# Patient Record
Sex: Female | Born: 1970 | Race: Black or African American | Hispanic: No | Marital: Single | Smoking: Never smoker
Health system: Southern US, Community
[De-identification: ages and names within clinical notes are randomized; demographics above are authoritative.]

## PROBLEM LIST (undated history)

## (undated) DIAGNOSIS — D649 Anemia, unspecified: Secondary | ICD-10-CM

## (undated) DIAGNOSIS — K589 Irritable bowel syndrome without diarrhea: Secondary | ICD-10-CM

## (undated) DIAGNOSIS — M1990 Unspecified osteoarthritis, unspecified site: Secondary | ICD-10-CM

## (undated) DIAGNOSIS — R7303 Prediabetes: Secondary | ICD-10-CM

## (undated) DIAGNOSIS — T8859XA Other complications of anesthesia, initial encounter: Secondary | ICD-10-CM

## (undated) DIAGNOSIS — K219 Gastro-esophageal reflux disease without esophagitis: Secondary | ICD-10-CM

## (undated) DIAGNOSIS — T7840XA Allergy, unspecified, initial encounter: Secondary | ICD-10-CM

## (undated) DIAGNOSIS — J189 Pneumonia, unspecified organism: Secondary | ICD-10-CM

## (undated) DIAGNOSIS — E739 Lactose intolerance, unspecified: Secondary | ICD-10-CM

## (undated) DIAGNOSIS — Z9889 Other specified postprocedural states: Secondary | ICD-10-CM

## (undated) DIAGNOSIS — I2699 Other pulmonary embolism without acute cor pulmonale: Secondary | ICD-10-CM

## (undated) DIAGNOSIS — R112 Nausea with vomiting, unspecified: Secondary | ICD-10-CM

## (undated) DIAGNOSIS — G4730 Sleep apnea, unspecified: Secondary | ICD-10-CM

## (undated) DIAGNOSIS — T4145XA Adverse effect of unspecified anesthetic, initial encounter: Secondary | ICD-10-CM

## (undated) DIAGNOSIS — G2581 Restless legs syndrome: Secondary | ICD-10-CM

## (undated) HISTORY — DX: Unspecified osteoarthritis, unspecified site: M19.90

## (undated) HISTORY — DX: Sleep apnea, unspecified: G47.30

## (undated) HISTORY — PX: KNEE SURGERY: SHX244

## (undated) HISTORY — DX: Gastro-esophageal reflux disease without esophagitis: K21.9

## (undated) HISTORY — DX: Lactose intolerance, unspecified: E73.9

## (undated) HISTORY — DX: Allergy, unspecified, initial encounter: T78.40XA

---

## 1998-08-23 ENCOUNTER — Ambulatory Visit (HOSPITAL_COMMUNITY): Admission: RE | Admit: 1998-08-23 | Discharge: 1998-08-23 | Payer: Self-pay | Admitting: Family Medicine

## 1999-04-13 ENCOUNTER — Encounter: Payer: Self-pay | Admitting: Family Medicine

## 1999-04-13 ENCOUNTER — Ambulatory Visit (HOSPITAL_COMMUNITY): Admission: RE | Admit: 1999-04-13 | Discharge: 1999-04-13 | Payer: Self-pay | Admitting: Family Medicine

## 2000-01-16 ENCOUNTER — Ambulatory Visit (HOSPITAL_BASED_OUTPATIENT_CLINIC_OR_DEPARTMENT_OTHER): Admission: RE | Admit: 2000-01-16 | Discharge: 2000-01-16 | Payer: Self-pay | Admitting: Orthopaedic Surgery

## 2002-09-10 ENCOUNTER — Encounter: Admission: RE | Admit: 2002-09-10 | Discharge: 2002-10-10 | Payer: Self-pay

## 2002-10-11 ENCOUNTER — Encounter: Admission: RE | Admit: 2002-10-11 | Discharge: 2002-11-10 | Payer: Self-pay | Admitting: *Deleted

## 2002-12-10 ENCOUNTER — Encounter: Admission: RE | Admit: 2002-12-10 | Discharge: 2003-01-09 | Payer: Self-pay | Admitting: *Deleted

## 2003-03-02 ENCOUNTER — Ambulatory Visit (HOSPITAL_BASED_OUTPATIENT_CLINIC_OR_DEPARTMENT_OTHER): Admission: RE | Admit: 2003-03-02 | Discharge: 2003-03-02 | Payer: Self-pay | Admitting: Orthopaedic Surgery

## 2003-04-07 ENCOUNTER — Emergency Department (HOSPITAL_COMMUNITY): Admission: EM | Admit: 2003-04-07 | Discharge: 2003-04-08 | Payer: Self-pay | Admitting: Emergency Medicine

## 2003-04-08 ENCOUNTER — Encounter: Payer: Self-pay | Admitting: Emergency Medicine

## 2004-08-01 ENCOUNTER — Encounter: Admission: RE | Admit: 2004-08-01 | Discharge: 2004-08-01 | Payer: Self-pay | Admitting: *Deleted

## 2004-08-03 ENCOUNTER — Encounter: Admission: RE | Admit: 2004-08-03 | Discharge: 2004-08-03 | Payer: Self-pay | Admitting: Interventional Radiology

## 2004-08-30 ENCOUNTER — Observation Stay (HOSPITAL_COMMUNITY): Admission: RE | Admit: 2004-08-30 | Discharge: 2004-08-31 | Payer: Self-pay | Admitting: Interventional Radiology

## 2005-06-27 ENCOUNTER — Encounter: Admission: RE | Admit: 2005-06-27 | Discharge: 2005-06-27 | Payer: Self-pay | Admitting: *Deleted

## 2006-01-01 HISTORY — PX: ABDOMINAL HYSTERECTOMY: SHX81

## 2006-01-01 HISTORY — PX: TOTAL ABDOMINAL HYSTERECTOMY: SHX209

## 2006-01-02 ENCOUNTER — Encounter (INDEPENDENT_AMBULATORY_CARE_PROVIDER_SITE_OTHER): Payer: Self-pay | Admitting: Specialist

## 2006-01-02 ENCOUNTER — Ambulatory Visit (HOSPITAL_COMMUNITY): Admission: RE | Admit: 2006-01-02 | Discharge: 2006-01-03 | Payer: Self-pay | Admitting: *Deleted

## 2006-05-20 ENCOUNTER — Ambulatory Visit (HOSPITAL_BASED_OUTPATIENT_CLINIC_OR_DEPARTMENT_OTHER): Admission: RE | Admit: 2006-05-20 | Discharge: 2006-05-20 | Payer: Self-pay | Admitting: Otolaryngology

## 2006-05-26 ENCOUNTER — Ambulatory Visit: Payer: Self-pay | Admitting: Internal Medicine

## 2006-10-04 ENCOUNTER — Ambulatory Visit: Payer: Self-pay | Admitting: Family Medicine

## 2006-11-07 ENCOUNTER — Encounter: Admission: RE | Admit: 2006-11-07 | Discharge: 2006-11-07 | Payer: Self-pay | Admitting: Interventional Radiology

## 2006-11-07 ENCOUNTER — Ambulatory Visit: Payer: Self-pay | Admitting: Family Medicine

## 2006-11-24 ENCOUNTER — Emergency Department (HOSPITAL_COMMUNITY): Admission: EM | Admit: 2006-11-24 | Discharge: 2006-11-25 | Payer: Self-pay | Admitting: Emergency Medicine

## 2006-11-25 ENCOUNTER — Ambulatory Visit: Payer: Self-pay | Admitting: Family Medicine

## 2006-12-24 ENCOUNTER — Ambulatory Visit (HOSPITAL_COMMUNITY): Admission: RE | Admit: 2006-12-24 | Discharge: 2006-12-24 | Payer: Self-pay | Admitting: Gastroenterology

## 2007-03-24 ENCOUNTER — Encounter: Admission: RE | Admit: 2007-03-24 | Discharge: 2007-03-24 | Payer: Self-pay | Admitting: Orthopaedic Surgery

## 2007-05-15 ENCOUNTER — Ambulatory Visit: Payer: Self-pay | Admitting: Family Medicine

## 2007-09-04 HISTORY — PX: OTHER SURGICAL HISTORY: SHX169

## 2007-10-28 ENCOUNTER — Ambulatory Visit (HOSPITAL_BASED_OUTPATIENT_CLINIC_OR_DEPARTMENT_OTHER): Admission: RE | Admit: 2007-10-28 | Discharge: 2007-10-28 | Payer: Self-pay | Admitting: Orthopaedic Surgery

## 2007-11-27 ENCOUNTER — Encounter: Admission: RE | Admit: 2007-11-27 | Discharge: 2007-11-27 | Payer: Self-pay | Admitting: Family Medicine

## 2007-11-27 LAB — HM MAMMOGRAPHY: HM Mammogram: NEGATIVE

## 2007-12-01 ENCOUNTER — Ambulatory Visit: Payer: Self-pay | Admitting: Family Medicine

## 2007-12-02 ENCOUNTER — Encounter: Admission: RE | Admit: 2007-12-02 | Discharge: 2007-12-02 | Payer: Self-pay | Admitting: Cardiology

## 2007-12-02 ENCOUNTER — Encounter: Admission: RE | Admit: 2007-12-02 | Discharge: 2007-12-02 | Payer: Self-pay | Admitting: Family Medicine

## 2008-02-19 ENCOUNTER — Ambulatory Visit: Payer: Self-pay | Admitting: Family Medicine

## 2008-03-02 ENCOUNTER — Encounter: Payer: Self-pay | Admitting: Family Medicine

## 2008-03-02 ENCOUNTER — Ambulatory Visit: Payer: Self-pay | Admitting: Family Medicine

## 2008-03-02 ENCOUNTER — Other Ambulatory Visit: Admission: RE | Admit: 2008-03-02 | Discharge: 2008-03-02 | Payer: Self-pay | Admitting: Family Medicine

## 2008-03-02 LAB — HM PAP SMEAR: HM Pap smear: NEGATIVE

## 2008-06-30 ENCOUNTER — Ambulatory Visit: Payer: Self-pay | Admitting: Family Medicine

## 2008-10-15 ENCOUNTER — Ambulatory Visit: Payer: Self-pay | Admitting: Family Medicine

## 2008-12-28 ENCOUNTER — Ambulatory Visit: Payer: Self-pay | Admitting: Family Medicine

## 2009-06-27 ENCOUNTER — Ambulatory Visit: Payer: Self-pay | Admitting: Family Medicine

## 2009-06-30 ENCOUNTER — Encounter: Admission: RE | Admit: 2009-06-30 | Discharge: 2009-06-30 | Payer: Self-pay | Admitting: Family Medicine

## 2010-09-24 ENCOUNTER — Encounter: Payer: Self-pay | Admitting: *Deleted

## 2010-10-24 ENCOUNTER — Ambulatory Visit (HOSPITAL_BASED_OUTPATIENT_CLINIC_OR_DEPARTMENT_OTHER)
Admission: RE | Admit: 2010-10-24 | Discharge: 2010-10-24 | Disposition: A | Discharge status: Home / Self Care | Payer: BC Managed Care – PPO | Source: Ambulatory Visit | Attending: Orthopaedic Surgery | Admitting: Orthopaedic Surgery

## 2010-10-24 DIAGNOSIS — Z86718 Personal history of other venous thrombosis and embolism: Secondary | ICD-10-CM | POA: Insufficient documentation

## 2010-10-24 DIAGNOSIS — S83259A Bucket-handle tear of lateral meniscus, current injury, unspecified knee, initial encounter: Secondary | ICD-10-CM | POA: Insufficient documentation

## 2010-10-24 DIAGNOSIS — K219 Gastro-esophageal reflux disease without esophagitis: Secondary | ICD-10-CM | POA: Insufficient documentation

## 2010-10-24 DIAGNOSIS — E669 Obesity, unspecified: Secondary | ICD-10-CM | POA: Insufficient documentation

## 2010-10-24 LAB — POCT HEMOGLOBIN-HEMACUE: Hemoglobin: 13.5 g/dL (ref 12.0–15.0)

## 2010-11-08 NOTE — Op Note (Addendum)
NAMEMarquis Hicks                ACCOUNT NO.:  [id number]  MEDICAL RECORD NO.:  [id number]            PATIENT TYPE:  LOCATION:                                 FACILITY:  PHYSICIAN:  Lubertha Basque. Jerl Santos, M.D.     DATE OF BIRTH:  DATE OF PROCEDURE:  10/24/2010 DATE OF DISCHARGE:                              OPERATIVE REPORT   PREOPERATIVE DIAGNOSES: 1. Left knee torn medial meniscus. 2. Left knee degenerative joint disease.  POSTOPERATIVE DIAGNOSES: 1. Left knee torn lateral meniscus. 2. Left knee degenerative joint disease.  PROCEDURES: 1. Left knee partial lateral meniscectomy. 2. Left knee abrasion chondroplasty patellofemoral.  ANESTHESIA:  General and block.  ATTENDING SURGEON:  Lubertha Basque. Jerl Santos, MD  ASSISTANT:  Lindwood Qua, PA   INDICATIONS FOR PROCEDURE:  The patient is a 40 year old woman with 20 years of bilateral knee troubles.  She has had multiple procedures on this left knee most recently an arthroscopy perhaps 5-6 years back. Some significant degenerative changes were noted in the patellofemoral at that point.  At this time, she has several months of pain refractory to injection, anti-inflammatories, and bracing.  She persists with an effusion and some loss of extension.  She is offered an arthroscopy. Informed operative consent was obtained after discussion of possible complications including reaction to anesthesia and infection.  SUMMARY, FINDINGS, AND PROCEDURE:  Under general anesthesia and knee block, a left knee arthroscopy was performed.  The patellofemoral portion of the knee exhibited grade 4 change which was basically unchanged from her arthroscopy many years back.  A thorough chondroplasty was done along with abrasion to bleeding bone in some small areas through the intertrochlear groove.  The medial compartment was missing most of the medial meniscus and there were some grade II and III changes here.  The ACL reconstruction appeared to be  intact.  In the lateral compartment, she had displaced bucket-handle tear stuck in an anterior position.  This was removed taking about 25% of lateral meniscus in the process.  She had minimal degenerative change in the lateral compartment.  DESCRIPTION OF PROCEDURE:  The patient was to the operating suite where general anesthetic was applied without difficulty.  She was also given a block in the preanesthesia area.  She was positioned supine and prepped and draped in a normal sterile fashion.  After administration of IV vancomycin, an arthroscopy of the left knee was performed through a total of 2 portals.  Findings were as noted above and procedure consisted predominantly of the partial lateral meniscectomy done with basket and shaver back to stable tissues.  We also performed chondroplasty as indicated basically through the entire knee with abrasion to bleeding bone patellofemoral and in the intertrochlear groove.  The knee was thoroughly irrigated followed by placement of Adaptic over the portals, dry gauze, and loose Ace wrap.  Estimated blood loss and intraoperative fluids can be obtained from anesthesia records.  DISPOSITION:  The patient was extubated in the operating room and taken to the recovery room in stable addition.  She was to go home same-day and follow up in the office closely.  I will contact her by phone tonight.     Lubertha Basque Jerl Santos, M.D.     PGD/MEDQ  D:  10/24/2010  T:  10/25/2010  Job:  [id number]  Electronically Signed by Marcene Corning M.D. on 11/07/2010 07:32:31 PM

## 2011-01-16 NOTE — Op Note (Signed)
NAMEHAIZLEE, HENTON      ACCOUNT NO.:  [id number]   MEDICAL RECORD NO.:  [id number]          PATIENT TYPE:  AMB   LOCATION:  DSC                          FACILITY:  [facility]   PHYSICIAN:  Lubertha Basque. Dalldorf, M.D.DATE OF BIRTH:  [birth year]   DATE OF PROCEDURE:  10/28/2007  DATE OF DISCHARGE:                               OPERATIVE REPORT   PREOPERATIVE DIAGNOSES:  1. Right wrist partial scapholunate tear.  2. Right knee chondromalacia.   POSTOPERATIVE DIAGNOSES:  1. Right wrist partial scapholunate tear.  2. Right knee chondromalacia of the patella.  3. Right knee loose body.   PROCEDURES:  1. Right wrist arthroscopic debridement.  2. Right knee arthroscopic abrasion chondroplasty, patellofemoral.  3. Right knee arthroscopic removal, large loose body.   ANESTHESIA:  General.   ATTENDING SURGEON:  Lubertha Basque. Jerl Santos, M.D.   INDICATIONS FOR PROCEDURE:  The patient is a 40 year old woman with  about a year of right wrist pain.  She underwent an MRI scan, which did  show a partial scapholunate tear.  We treated her with immobilization  and, I believe, an injection, and she has been left with occasional  sharp pains in the wrist.  This has become disabling to her and she is  offered an arthroscopy.  At her right knee she has a long, extensive  history of reconstructive surgery and arthroscopies and this point has  an effusion which is not responding to oral anti-inflammatories and  activity restriction.  We are assuming she has at least some articular  cartilage damage driving her effusion and she is offered an arthroscopy  at the same sitting as her other procedure.  Informed operative consent  was obtained after discussion of the possible complications of reaction  to anesthesia and infection.   SUMMARY OF FINDINGS AND PROCEDURE:  Under general anesthesia,  arthroscopies of the right wrist the right knee were performed.  We  started with the wrist.  Here she did have a  partial scapholunate tear  addressed with a debridement.  The TFC appeared intact and there were  minimal degenerative changes.  At the knee she had a very large loose  body measuring 1 x 1 x 0.5 cm in size.  This was bony and was removed.  She had no degenerative change medial or lateral with benign-appearing  menisci.  Her reconstructed ACL certainly did not appear normal but  appeared quite functional.  The patellofemoral joint exhibited grade 3  and grade 4 breakdown with two small areas of grade 4 breakdown in the  intertrochlear groove, addressed with abrasion to bleeding bone.   DESCRIPTION OF PROCEDURE:  The patient was taken to the operating suite,  where a general anesthetic was applied without difficulty.  She was  positioned supine and prepped and draped in a normal sterile fashion for  a wrist arthroscopy.  After the administration of preop IV clindamycin,  the wrist was placed in slight traction on an arthroscopy tower.  We  then injected just distal to Lister's tubercle with 5 mL of saline.  I  made an incision in this area with blunt dissection into the capsule and  placed  the scope.  I then made a second incision more ulnar just radial  to the ECU tendon.  The capsule was entered bluntly here as well and a  shaver was placed.  A thorough examination of the wrist ensued.  The  intrinsic ligaments on the radial styloid appeared to be intact and  there were no significant degenerative changes.  The TFC appeared intact  with no tears appreciated.  She did have a partial tear of the  scapholunate ligament as seen on the MRI.  I debrided this back to  stable tissues.  The wrist was irrigated, followed by removal of  arthroscopic equipment.  I did place simple sutures of nylon in both the  portals and did inject with 1 or 2 mL of Marcaine plus 40 mg of Depo-  Medrol.  Adaptic was applied, followed by dry gauze and a loose Ace  wrap.  Attention was then turned toward the knee.   She was then prepped  and draped once again for a knee arthroscopy.  All members of the  surgical team also changed gowns and scrubbed once again.  An  arthroscopy was performed through two portals.  Findings were as noted  above and the procedure consisted of the removal of the large loose body  from the anterior aspect of the knee in the notch.  The underlying ACL  appeared to be intact though was certainly not normal.  There were no  degenerative changes in either medial or lateral compartment, but the  patellofemoral joint exhibited grade 3 and focal grade 4 change,  especially in the intertrochlear groove.  I did perform abrasion to  bleeding bone in two small areas here.  The knee was thoroughly  irrigated, followed by placement Marcaine with epinephrine and morphine.  Adaptic was placed over the portals, followed by dry gauze and a loose  Ace wrap.  Estimated blood loss and intraoperative fluids can be  obtained from anesthesia records.   DISPOSITION:  The patient was extubated in the operating room and taken  to the recovery room in stable addition.  She was to go home the same  day and to follow up in the office in less than a week.  I will contact  her by phone tonight.      Lubertha Basque Jerl Santos, M.D.  Electronically Signed     PGD/MEDQ  D:  10/28/2007  T:  10/28/2007  Job:  [id number]

## 2011-01-19 NOTE — Op Note (Signed)
NAMEKINLIE, JANICE      ACCOUNT NO.:  [id number]   MEDICAL RECORD NO.:  [id number]          PATIENT TYPE:  OIB   LOCATION:  9399                          FACILITY:  [facility]   PHYSICIAN:  [place] B. Earlene Plater, M.D.  DATE OF BIRTH:  1971/02/02   DATE OF PROCEDURE:  01/02/2006  DATE OF DISCHARGE:                                 OPERATIVE REPORT   PREOPERATIVE DIAGNOSIS:  Abnormal bleeding, uterine fibroids.   POSTOPERATIVE DIAGNOSIS:  Abnormal bleeding, uterine fibroids.   PROCEDURE:  Laparoscopic supracervical hysterectomy.   SURGEON:  Chester Holstein. Earlene Plater, M.D.   ASSISTANT:  Cordelia Pen A. Rosalio Macadamia, M.D.   ANESTHESIA:  General.   SPECIMENS:  405 g uterus, normal-appearing tubes and ovaries.   ESTIMATED BLOOD LOSS:  200.   COMPLICATIONS:  None.   INDICATIONS FOR PROCEDURE:  Patient with a history of heavy menstrual  bleeding and associated pelvic pain and dysmenorrhea.  She is status post  uterine artery embolization without relief of symptoms.  She also has a  history of deep venous thrombosis after orthopedic surgery while taking  birth control pills.  She had negative thrombophilia panel.  However, given  this history, I did recommend prophylactic heparin in addition to sequential  compression devices to reduce the risk of postoperative DVT.  The patient  was advised of the risks of surgery, including infection, bleeding, damage  to surrounding organs, and potential to convert to hysterectomy abdominally.  In addition, she was advised of the 1% chance of regular menses and 7%  chance of occasional staining or spotting.   DESCRIPTION OF PROCEDURE:  The patient was taken to the operating room and  general anesthesia obtained.  She was prepped and draped in the standard  fashion with legs in the [place]in stirrups.  A Foley catheter was inserted into  the bladder.   A 10-mm incision was placed in the umbilicus and carried sharply to the  fascia.  The fascia was divided sharply and  elevated with Kocher clamps.  The posterior sheath and peritoneum were elevated and entered sharply.  A  pursestring suture of 0 Vicryl was placed around the fascial defect.  A  Hasson cannula was inserted and secured.  Pneumoperitoneum was obtained with  CO2 gas.  5-mm ports were placed in the right lower quadrant and suprapubic  region, and an 11-mm trocar in the left lower quadrant, each under direct  laparoscopic visualization.   Trendelenburg position was obtained.  The uterus and abdomen were inspected.  There were omental adhesions to the lower abdominal wall in the region of  her previous C-section scar.  These were filmy and did not contain bowel.  They were therefore taken down with harmonic.  The uterus was similarly  adherent to the abdominal wall densely in the midline.  No vital structures  were identified in the surrounding region; therefore, this was taken down  sharply with harmonic.   The left cornual area was placed on traction.  The left round ligament was  identified, sealed, and divided with the harmonic.  The left tube and ovary  were similarly divided from the uterus.  The remainder of the cardinal  ligaments were skeletonized.  This was made more difficult due to a lateral  broad ligament fibroid.  The dissection was taken around the fibroid,  staying in the plane just lateral to the fibroid.  The left uterine artery  was identified, skeletonized, sealed, and divided with the harmonic.  The  bladder flap was created sharply with the harmonic.  The entire procedure  was repeated on the right side in the same manner.  Similarly, there was a  right broad ligament fibroid, and similar dissection was required.  The  course of each ureter was identified and was not in the region.   The cervix was truncated in a reverse cone fashion with the harmonic  scalpel.  Essentially the entire cervix was removed, leaving only a small  tip of the cervix.   The uterus was then  morcellated in the standard fashion and all debris  removed.  The pelvis was irrigated and inspected.  There was bleeding from  the posterior peritoneum in the midline at the cervix.  This was made  hemostatic with bipolar cautery and a piece of Gelfoam.  The remainder of  the lines of dissection were hemostatic.   The trocars were removed.  Each site was inspected and was hemostatic.   The scope was removed.  Gas was allowed was released.  The Hasson cannula  was removed.  The pursestring suture was snugged down after the incision was  elevated.  This obliterated the fascial defect, and no abdominal contents  herniated prior to closure.  The skin was closed at the midline and left  lower quadrant incision with subcutaneous stitches of 4-0 Vicryl and the  skin closed at each site with Dermabond.   The speculum was inserted and confirmed that there was still the tip of the  cervix present, and no colpotomy was made.   The patient tolerated the procedure well.  There were no complications.  She  was taken to the recovery room in stable condition.      Gerri Spore B. Earlene Plater, M.D.  Electronically Signed     WBD/MEDQ  D:  01/02/2006  T:  01/03/2006  Job:  [id number]

## 2011-01-19 NOTE — Op Note (Signed)
Karen Hicks, Karen Hicks                  ACCOUNT NO.:  [id number]   MEDICAL RECORD NO.:  [id number]                   PATIENT TYPE:  AMB   LOCATION:  DSC                                  FACILITY:  [facility]   PHYSICIAN:  Lubertha Basque. Jerl Santos, M.D.             DATE OF BIRTH:  1970-11-20   DATE OF PROCEDURE:  03/02/2003  DATE OF DISCHARGE:                                 OPERATIVE REPORT   PREOPERATIVE DIAGNOSES:  1. Left knee torn medial meniscus.  2. Left knee chondromalacia patella.   POSTOPERATIVE DIAGNOSES:  1. Left knee torn medial meniscus.  2. Left knee chondromalacia patella.   PROCEDURES:  1. Left knee partial medial meniscectomy.  2. Left knee chondroplasty of patellofemoral compartment.   ANESTHESIA:  General.   ATTENDING SURGEON:  Lubertha Basque. Jerl Santos, M.D.   ASSISTANT:  Lindwood Qua, P.A.   INDICATIONS FOR PROCEDURE:  The patient is a 40 year old woman with a length  left knee history.  She is status post an ACL reconstruction in college and  has had a subsequent arthroscopy at least once.  Her last surgery was five  or six years ago.  She has had several months of persistent anterior and  medial aspect knee pain.  She has also had an associated effusion.  She has  failed an exercise program, rest, and oral anti-inflammatories.  She was  offered an arthroscopy.  Informed operative consent was obtained after  discussion of the possible complications, reaction to anesthesia, and  infection.  She also had a complication of a PE after her last knee  arthroscopy and we decided to place her on some Lovenox after the surgery.   DESCRIPTION OF PROCEDURE:  The patient was taken to the operating suite  where general anesthetic was supplied without difficulty.  She was  positioned supine and prepped and draped in normal sterile fashion.  After  administration of IV antibiotics, an arthroscopy of the left knee was  performed through two old anterior portals.  The  suprapatellar pouch was  benign while the patellofemoral joint exhibited grade 3 and grade 4 change  across mostly the intertrochlear groove.  She had large flaps of articular  cartilage, especially on the lateral aspect of the groove, which required  thorough debridement back to stable structures.  In the medial compartment,  she had some small tears in the medial meniscus addressed with partial  medial meniscectomy.  She had very little residual meniscus remaining.  ACL  graft appeared to be intact, though the notch was becoming a bit stenotic.  The lateral compartment was completely benign with no evidence of meniscal  articular cartilage injury.  The knee was thoroughly irrigated at the end of  the case followed by placement of Marcaine with epinephrine and morphine.  Adaptic was placed over the portals followed by dry gauze and a loose Ace  wrap.  The estimated blood loss and intraoperative fluids can be obtained  from the anesthesia  records.  No tourniquet was placed.   DISPOSITION:  The patient was extubated in the operating room and taken to  the operating room in stable condition.  Plans were for her go home the same  day and to follow up in the office in less than a week.  I will contact her  by phone tonight.                                               Lubertha Basque Jerl Santos, M.D.    PGD/MEDQ  D:  03/02/2003  T:  03/02/2003  Job:  [id number]

## 2011-01-19 NOTE — Procedures (Signed)
NAME:  Karen Hicks, Karen Hicks      ACCOUNT NO.:  [id number]   MEDICAL RECORD NO.:  [id number]          PATIENT TYPE:  OUT   LOCATION:  SLEEP CENTER                 FACILITY:  [facility]   PHYSICIAN:  Clinton D. Maple Hudson, MD, FCCP, FACPDATE OF BIRTH:  07[birth year]   DATE OF STUDY:  05/20/2006                              NOCTURNAL POLYSOMNOGRAM   DATE OF STUDY:  May 20, 2006.   INDICATIONS FOR STUDY:  Hypersomnia with sleep apnea.   EPWORTH SLEEPINESS SCORE:  13/24, BMI 30.5, weight 195 pounds.   HOME MEDICATIONS:  Singulair, Nasonex.   SLEEP ARCHITECTURE:  Total sleep time 428 minutes with sleep efficiency 92%.  Stage I was 3%, stage II 66%, stages III and IV 15%, REM 17% of total sleep  time. Sleep latency 26 minutes, REM latency 85 minutes, awake after sleep  onset 9.5 minutes, arousal index 4.1. No bedtime medication was taken.   RESPIRATORY DATA:  Apnea/hypopnea index (AHI, RDI) 4.9 obstructive events  per hour which is at the upper limits of normal (normal range 0-5 per hour).  There were 13 obstructive apneas and 22 hypopneas. She slept almost  exclusively supine, and all events were recorded in that position. REM AHI  16.7 per hour.   OXYGEN DATA:  Moderate snoring with oxygen desaturation to a nadir of 91%.  Mean oxygen saturation through the study was 97% on room air.   CARDIAC DATA:  Normal sinus rhythm.   MOVEMENTS/PARASOMNIA:  Occasional limb jerk, insignificant.   IMPRESSION/RECOMMENDATIONS:  1. Occasional sleep disordered breathing events at the upper limits of      normal, AHI 5 per hour. She did not qualify for CPAP titration on this      study night. Most sleep and events were while supine.  Moderate snoring      with oxygen desaturation to a nadir of 91%.  2. Suggested interventions would include weight loss and encouragement to      sleep off her back.      Clinton D. Maple Hudson, MD, [facility], FACP  Diplomate, Biomedical engineer of Sleep Medicine  Electronically  Signed     CDY/MEDQ  D:  05/26/2006 11:07:21  T:  05/28/2006 13:16:37  Job:  [id number]   cc:   Lucky Cowboy, MD  Fax: [phone]

## 2011-01-19 NOTE — Op Note (Signed)
[place]  Patient:    Karen Hicks, Karen Hicks                  MRN: [id number] Proc. Date: 01/16/00 Attending:  Lubertha Basque. Jerl Santos, M.D.                           Operative Report  PREOPERATIVE DIAGNOSES: 1. Right knee torn medial meniscus. 2. Right knee chondromalacia patella.  POSTOPERATIVE DIAGNOSES: 1. Right knee torn medial meniscus. 2. Right knee chondromalacia patella.  OPERATIONS: 1. Right knee partial meniscectomy. 2. Right knee chondroplast patella. 3. Right knee arthroscopic lateral release.  SURGEON:  Lubertha Basque. Jerl Santos, M.D.  ASSISTANT:  Prince Rome, P.A.  ANESTHESIA:  General.  INDICATION:  The patient is a 40 year old woman who is many years out from bilateral ACL reconstructions done in college.  At this point, she has some right knee pain which has not responded to conservative measures with exercise, anti-inflammatories, and rest.  She is offered an arthroscopy.  The procedure was discussed with the patient and informed operative consent was obtained after discussion of possible complications of reaction to anesthesia and infection.  DESCRIPTION OF PROCEDURE:  The patient was taken to the operating suite where general anesthetic was applied without difficulty.  She was positioned supine and prepped and draped in a normal sterile fashion.  After administration of preoperative IV antibiotics, an arthroscopy of the right knee was performed through a total of three portals.  Suprapatellar pouch was benign while the patella femoral joint exhibited some significant chondromalacia which was a grade 3 in several areas.  She also had some change in the intratrochlea groove at grade 2 or 3 in small areas.  This was addressed with a thorough chondroplasty of both surfaces.  She also tracked in a far lateral position and arthroscopic lateral release was added through the third portal.  This did improve the position of her  patella.  In the medial compartment, she had a tiny anterior horn medial meniscus tear which was addressed with the partial medial meniscectomy back to stable rim. The middle and posterior horns appeared normal.  There were no degenerative changes in that compartment.  Her ACL reconstruction appeared to be stable. The lateral compartment was completely benign with no evidence of meniscal or articular cartilage injury.  The knee was thoroughly irrigated at the end of the case followed by placement of Marcaine with epinephrine and Morphine.  Adaptic was placed over the three portals, followed by a dry gauze, and loose Ace wrap.  ESTIMATED BLOOD LOSS:  Estimated blood loss and intraoperative fluids obtained from anesthesia records.  No tourniquet was used.  DISPOSITION:  The patient was extubated in the operating room and taken to the recovery room in stable condition.  PLAN:  Plans were for her to go home on the same day and follow up in the office in less than a week.  I will contact her by phone tonight. DD:  01/16/00 TD:  01/16/00 Job: [id number]

## 2011-05-25 LAB — POCT HEMOGLOBIN-HEMACUE: Hemoglobin: 13.3

## 2011-06-11 ENCOUNTER — Encounter: Payer: Self-pay | Admitting: Family Medicine

## 2011-06-12 ENCOUNTER — Encounter: Payer: Self-pay | Admitting: Family Medicine

## 2011-06-12 ENCOUNTER — Ambulatory Visit (INDEPENDENT_AMBULATORY_CARE_PROVIDER_SITE_OTHER): Payer: BC Managed Care – PPO | Admitting: Family Medicine

## 2011-06-12 VITALS — BP 134/80 | HR 79 | Ht 67.0 in | Wt 205.0 lb

## 2011-06-12 DIAGNOSIS — G43909 Migraine, unspecified, not intractable, without status migrainosus: Secondary | ICD-10-CM | POA: Insufficient documentation

## 2011-06-12 DIAGNOSIS — R232 Flushing: Secondary | ICD-10-CM

## 2011-06-12 DIAGNOSIS — N951 Menopausal and female climacteric states: Secondary | ICD-10-CM

## 2011-06-12 DIAGNOSIS — Z79899 Other long term (current) drug therapy: Secondary | ICD-10-CM

## 2011-06-12 DIAGNOSIS — K219 Gastro-esophageal reflux disease without esophagitis: Secondary | ICD-10-CM

## 2011-06-12 DIAGNOSIS — J301 Allergic rhinitis due to pollen: Secondary | ICD-10-CM

## 2011-06-12 DIAGNOSIS — G4730 Sleep apnea, unspecified: Secondary | ICD-10-CM

## 2011-06-12 DIAGNOSIS — E669 Obesity, unspecified: Secondary | ICD-10-CM

## 2011-06-12 LAB — CBC WITH DIFFERENTIAL/PLATELET
Basophils Absolute: 0 10*3/uL (ref 0.0–0.1)
Basophils Relative: 0 % (ref 0–1)
Eosinophils Absolute: 0.2 10*3/uL (ref 0.0–0.7)
Eosinophils Relative: 2 % (ref 0–5)
HCT: 39.7 % (ref 36.0–46.0)
Hemoglobin: 13.4 g/dL (ref 12.0–15.0)
Lymphocytes Relative: 44 % (ref 12–46)
Lymphs Abs: 3.5 10*3/uL (ref 0.7–4.0)
MCH: 27.9 pg (ref 26.0–34.0)
MCHC: 33.8 g/dL (ref 30.0–36.0)
MCV: 82.7 fL (ref 78.0–100.0)
Monocytes Absolute: 0.4 10*3/uL (ref 0.1–1.0)
Monocytes Relative: 5 % (ref 3–12)
Neutro Abs: 3.9 10*3/uL (ref 1.7–7.7)
Neutrophils Relative %: 49 % (ref 43–77)
Platelets: 324 10*3/uL (ref 150–400)
RBC: 4.8 MIL/uL (ref 3.87–5.11)
RDW: 13.4 % (ref 11.5–15.5)
WBC: 8 10*3/uL (ref 4.0–10.5)

## 2011-06-12 LAB — LIPID PANEL
Cholesterol: 248 mg/dL — ABNORMAL HIGH (ref 0–200)
HDL: 38 mg/dL — ABNORMAL LOW (ref >39)
LDL Cholesterol: 163 mg/dL — ABNORMAL HIGH (ref 0–99)
Total CHOL/HDL Ratio: 6.5 Ratio
Triglycerides: 235 mg/dL — ABNORMAL HIGH (ref <150)
VLDL: 47 mg/dL — ABNORMAL HIGH (ref 0–40)

## 2011-06-12 LAB — COMPREHENSIVE METABOLIC PANEL
ALT: 30 U/L (ref 0–35)
AST: 35 U/L (ref 0–37)
Albumin: 4.7 g/dL (ref 3.5–5.2)
Alkaline Phosphatase: 68 U/L (ref 39–117)
BUN: 11 mg/dL (ref 6–23)
CO2: 23 mEq/L (ref 19–32)
Calcium: 9.4 mg/dL (ref 8.4–10.5)
Chloride: 103 mEq/L (ref 96–112)
Creat: 0.71 mg/dL (ref 0.50–1.10)
Glucose, Bld: 85 mg/dL (ref 70–99)
Potassium: 3.9 mEq/L (ref 3.5–5.3)
Sodium: 140 mEq/L (ref 135–145)
Total Bilirubin: 0.5 mg/dL (ref 0.3–1.2)
Total Protein: 7.5 g/dL (ref 6.0–8.3)

## 2011-06-12 LAB — LUTEINIZING HORMONE: LH: 4.9 m[IU]/mL

## 2011-06-12 LAB — FOLLICLE STIMULATING HORMONE: FSH: 13.6 m[IU]/mL

## 2011-06-12 MED ORDER — FROVATRIPTAN SUCCINATE 2.5 MG PO TABS: 2.5000 mg | ORAL_TABLET | ORAL | Status: DC | PRN

## 2011-06-12 MED ORDER — AMITRIPTYLINE HCL 25 MG PO TABS: 25.0000 mg | ORAL_TABLET | Freq: Every day | ORAL | Status: DC

## 2011-06-12 MED ORDER — ESOMEPRAZOLE MAGNESIUM 20 MG PO CPDR: 20.0000 mg | DELAYED_RELEASE_CAPSULE | Freq: Every day | ORAL | Status: DC

## 2011-06-12 NOTE — Patient Instructions (Signed)
Tried Frova and let me know if it works. Also continue on the amitriptyline

## 2011-06-12 NOTE — Progress Notes (Signed)
  Subjective:    Patient ID: Karen Hicks, female    DOB: [birth year], 40 y.o.   MRN: [id number]  HPI She is here for complete examination. She continues to have difficulty with migraine headaches. She is on amitriptyline 10 mg. she has one headache a month but it was relatively severe. She also is now having difficulty with hot flashes over the last 3 months. She complains of heat and sweating sensation on her head and shoulders that last approximately 20 minutes. She continues to use her CPAP machine. She is also loss of libido weight. Her allergies are under good control. Work is going well. 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 Family and social history was reviewed   Review of Systems Negative except as above    Objective:   Physical Exam BP 134/80  Pulse 79  Ht 5\' 7"  (1.702 m)  Wt 205 lb (92.987 kg)  BMI 32.11 kg/m2  General Appearance:    Alert, cooperative, no distress, appears stated age  Head:    Normocephalic, without obvious abnormality, atraumatic  Eyes:    PERRL, conjunctiva/corneas clear, EOM's intact, fundi    benign  Ears:    Normal TM's and external ear canals  Nose:   Nares normal, mucosa normal, no drainage or sinus   tenderness  Throat:   Lips, mucosa, and tongue normal; teeth and gums normal  Neck:   Supple, no lymphadenopathy;  thyroid:  no   enlargement/tenderness/nodules; no carotid   bruit or JVD  Back:    Spine nontender, no curvature, ROM normal, no CVA     tenderness  Lungs:     Clear to auscultation bilaterally without wheezes, rales or     ronchi; respirations unlabored  Chest Wall:    No tenderness or deformity   Heart:    Regular rate and rhythm, S1 and S2 normal, no murmur, rub   or gallop  Breast Exam:    Deferred to GYN  Abdomen:     Soft, non-tender, nondistended, normoactive bowel sounds,    no masses, no hepatosplenomegaly  Genitalia:    Deferred to GYN     Extremities:   No clubbing, cyanosis or edema  Pulses:   2+ and symmetric all  extremities  Skin:   Skin color, texture, turgor normal, no rashes or lesions  Lymph nodes:   Cervical, supraclavicular, and axillary nodes normal  Neurologic:   CNII-XII intact, normal strength, sensation and gait; reflexes 0-1+ and symmetric throughout          Psych:   Normal mood, affect, hygiene and grooming.           Assessment & Plan:   1. GERD (gastroesophageal reflux disease)    2. Sleep apnea    3. Obesity (BMI 30-39.9)    4. Allergic rhinitis due to pollen    5. Migraine headache    6. Hot flashes  FSH, LH  7. Encounter for long-term (current) use of other medications  CBC with Differential, Comprehensive metabolic panel, Lipid panel   I will check FSH and LH as well as routine blood screening. Increase amitriptyline to 25 mg. Also gave Frova for her headache. She will let me know about this. Continue on her other medications. I will refill her Nexium

## 2011-06-28 ENCOUNTER — Telehealth: Payer: Self-pay | Admitting: Family Medicine

## 2011-06-29 ENCOUNTER — Other Ambulatory Visit: Payer: Self-pay | Admitting: Family Medicine

## 2011-06-29 MED ORDER — NARATRIPTAN HCL 2.5 MG PO TABS: 2.5000 mg | ORAL_TABLET | ORAL | Status: DC | PRN

## 2011-06-29 NOTE — Telephone Encounter (Signed)
PLS SIGN OFF ON MED-LM

## 2011-07-02 NOTE — Telephone Encounter (Signed)
DONE

## 2012-01-07 ENCOUNTER — Encounter: Payer: Self-pay | Admitting: Family Medicine

## 2012-01-07 ENCOUNTER — Ambulatory Visit (INDEPENDENT_AMBULATORY_CARE_PROVIDER_SITE_OTHER): Payer: BC Managed Care – PPO | Admitting: Family Medicine

## 2012-01-07 VITALS — BP 120/80 | HR 92 | Wt 211.0 lb

## 2012-01-07 DIAGNOSIS — R109 Unspecified abdominal pain: Secondary | ICD-10-CM

## 2012-01-07 NOTE — Patient Instructions (Signed)
Use heat for 20 minutes 3 times per day. Also take Aleve 2 pills 2 or 3 times per day. If this doesn't take it away in the next 10 days, back

## 2012-01-07 NOTE — Progress Notes (Signed)
  Subjective:    Patient ID: Karen Hicks, female    DOB: 1971-04-30, 41 y.o.   MRN: [id number]  HPI She has a history of right lower rib pain. This started a day or 2 after she was playing with her nephew had no pain at that time he describes the pain as burning but does hurt to breathe. No fever, chills, cough or congestion. Pain is made worse with moving and with using her left arm. No nausea, vomiting, diarrhea or constipation or urinary symptoms   Review of Systems     Objective:   Physical Exam Alert and in no distress. No tenderness palpation of the ribs or flank area. There is pain on lateral motion. Lungs clear to auscultation. Cardiac exam shows regular rhythm without murmurs or gallops.       Assessment & Plan:  Left flank pain, etiology unclear. Recommend heat and anti-inflammatory. Recheck here if continued difficulty.

## 2012-01-22 ENCOUNTER — Other Ambulatory Visit: Payer: Self-pay | Admitting: Family Medicine

## 2012-07-14 ENCOUNTER — Telehealth: Payer: Self-pay | Admitting: Family Medicine

## 2012-07-14 MED ORDER — AMITRIPTYLINE HCL 25 MG PO TABS: 25.0000 mg | ORAL_TABLET | Freq: Every day | ORAL | Status: DC

## 2012-07-14 NOTE — Telephone Encounter (Signed)
Elavil renewed

## 2012-07-15 ENCOUNTER — Other Ambulatory Visit: Payer: Self-pay | Admitting: Family Medicine

## 2012-08-12 ENCOUNTER — Ambulatory Visit: Payer: BC Managed Care – PPO | Admitting: Family Medicine

## 2012-08-15 ENCOUNTER — Ambulatory Visit: Payer: BC Managed Care – PPO | Admitting: Family Medicine

## 2012-10-07 ENCOUNTER — Other Ambulatory Visit: Payer: Self-pay | Admitting: Family Medicine

## 2013-02-25 ENCOUNTER — Other Ambulatory Visit: Payer: Self-pay | Admitting: Obstetrics & Gynecology

## 2013-02-25 DIAGNOSIS — N63 Unspecified lump in unspecified breast: Secondary | ICD-10-CM

## 2013-03-16 ENCOUNTER — Ambulatory Visit
Admission: RE | Admit: 2013-03-16 | Discharge: 2013-03-16 | Disposition: A | Discharge status: Home / Self Care | Payer: BC Managed Care – PPO | Source: Ambulatory Visit | Attending: Obstetrics & Gynecology | Admitting: Obstetrics & Gynecology

## 2013-03-16 ENCOUNTER — Other Ambulatory Visit: Payer: Self-pay | Admitting: Obstetrics & Gynecology

## 2013-03-16 DIAGNOSIS — N63 Unspecified lump in unspecified breast: Secondary | ICD-10-CM

## 2013-04-14 ENCOUNTER — Encounter: Payer: Self-pay | Admitting: Internal Medicine

## 2013-04-14 NOTE — Telephone Encounter (Signed)
This encounter was created in error - please disregard.

## 2013-04-21 ENCOUNTER — Other Ambulatory Visit: Payer: Self-pay | Admitting: Specialist

## 2013-04-21 DIAGNOSIS — G43719 Chronic migraine without aura, intractable, without status migrainosus: Secondary | ICD-10-CM

## 2013-04-26 ENCOUNTER — Other Ambulatory Visit: Payer: BC Managed Care – PPO

## 2013-06-09 ENCOUNTER — Telehealth: Payer: Self-pay | Admitting: Family Medicine

## 2013-06-09 NOTE — Telephone Encounter (Signed)
Pt is requesting refill for Nexium 40mg  # 30 into [facility] off [address].  Please let pt know when done.

## 2013-06-09 NOTE — Telephone Encounter (Signed)
TRIED CALLING PT COULD NOT GET ANSWER MED REFILL DENY DUE TO PT NEEDS APPOINTMENT FOR MED CHECK

## 2013-06-09 NOTE — Telephone Encounter (Signed)
I HAVE TRIED TO CALL PT TWICE WITH NO ANSWER TO INFORM HER TO MAKE AN APPOINTMENT CALLED PHARMACY TO DENY

## 2013-06-10 ENCOUNTER — Other Ambulatory Visit: Payer: Self-pay | Admitting: Family Medicine

## 2013-06-10 ENCOUNTER — Ambulatory Visit (INDEPENDENT_AMBULATORY_CARE_PROVIDER_SITE_OTHER): Payer: BC Managed Care – PPO | Admitting: Family Medicine

## 2013-06-10 ENCOUNTER — Encounter: Payer: Self-pay | Admitting: Family Medicine

## 2013-06-10 VITALS — BP 120/82 | HR 90 | Wt 200.0 lb

## 2013-06-10 DIAGNOSIS — K219 Gastro-esophageal reflux disease without esophagitis: Secondary | ICD-10-CM

## 2013-06-10 DIAGNOSIS — G4730 Sleep apnea, unspecified: Secondary | ICD-10-CM

## 2013-06-10 DIAGNOSIS — E669 Obesity, unspecified: Secondary | ICD-10-CM

## 2013-06-10 DIAGNOSIS — E785 Hyperlipidemia, unspecified: Secondary | ICD-10-CM

## 2013-06-10 DIAGNOSIS — G43909 Migraine, unspecified, not intractable, without status migrainosus: Secondary | ICD-10-CM

## 2013-06-10 DIAGNOSIS — J301 Allergic rhinitis due to pollen: Secondary | ICD-10-CM

## 2013-06-10 LAB — LIPID PANEL
Cholesterol: 229 mg/dL — ABNORMAL HIGH (ref 0–200)
HDL: 33 mg/dL — ABNORMAL LOW (ref >39)
LDL Cholesterol: 149 mg/dL — ABNORMAL HIGH (ref 0–99)
Total CHOL/HDL Ratio: 6.9 Ratio
Triglycerides: 237 mg/dL — ABNORMAL HIGH (ref <150)
VLDL: 47 mg/dL — ABNORMAL HIGH (ref 0–40)

## 2013-06-10 LAB — COMPREHENSIVE METABOLIC PANEL
ALT: 29 U/L (ref 0–35)
AST: 30 U/L (ref 0–37)
Albumin: 4.5 g/dL (ref 3.5–5.2)
Alkaline Phosphatase: 70 U/L (ref 39–117)
BUN: 10 mg/dL (ref 6–23)
CO2: 21 mEq/L (ref 19–32)
Calcium: 9.3 mg/dL (ref 8.4–10.5)
Chloride: 105 mEq/L (ref 96–112)
Creat: 0.79 mg/dL (ref 0.50–1.10)
Glucose, Bld: 162 mg/dL — ABNORMAL HIGH (ref 70–99)
Potassium: 4.5 mEq/L (ref 3.5–5.3)
Sodium: 135 mEq/L (ref 135–145)
Total Bilirubin: 0.5 mg/dL (ref 0.3–1.2)
Total Protein: 7.6 g/dL (ref 6.0–8.3)

## 2013-06-10 LAB — CBC WITH DIFFERENTIAL/PLATELET
Basophils Absolute: 0 10*3/uL (ref 0.0–0.1)
Basophils Relative: 0 % (ref 0–1)
Eosinophils Absolute: 0.1 10*3/uL (ref 0.0–0.7)
Eosinophils Relative: 2 % (ref 0–5)
HCT: 40.6 % (ref 36.0–46.0)
Hemoglobin: 13.8 g/dL (ref 12.0–15.0)
Lymphocytes Relative: 44 % (ref 12–46)
Lymphs Abs: 3.1 10*3/uL (ref 0.7–4.0)
MCH: 27.8 pg (ref 26.0–34.0)
MCHC: 34 g/dL (ref 30.0–36.0)
MCV: 81.9 fL (ref 78.0–100.0)
Monocytes Absolute: 0.3 10*3/uL (ref 0.1–1.0)
Monocytes Relative: 4 % (ref 3–12)
Neutro Abs: 3.6 10*3/uL (ref 1.7–7.7)
Neutrophils Relative %: 50 % (ref 43–77)
Platelets: 339 10*3/uL (ref 150–400)
RBC: 4.96 MIL/uL (ref 3.87–5.11)
RDW: 14.4 % (ref 11.5–15.5)
WBC: 7.1 10*3/uL (ref 4.0–10.5)

## 2013-06-10 MED ORDER — ESOMEPRAZOLE MAGNESIUM 20 MG PO CPDR: 20.0000 mg | DELAYED_RELEASE_CAPSULE | Freq: Every day | ORAL | Status: DC

## 2013-06-10 NOTE — Progress Notes (Signed)
  Subjective:    Patient ID: Karen Hicks, female    DOB: 1971/07/26, 42 y.o.   MRN: [id number]  HPI She is here for a medication check. She does have sleep apnea and is being followed by Dr. Gerilyn Pilgrim for this. She also has a history of migraine headaches and presently is on medications which have this under good control. Her allergies are mainly in the spring and she has no difficulty with them. She does have reflux disease and uses Nexium at this point on an as-needed basis. Review of her record indicates she does have elevated lipids. Her work and home life are going well. Family history significant for mother with breast cancer and another relative with cervical cancer as well as a female relative with prostate cancer.  Review of Systems Negative except as above    Objective:   Physical Exam alert and in no distress. Tympanic membranes and canals are normal. Throat is clear. Tonsils are normal. Neck is supple without adenopathy or thyromegaly. Cardiac exam shows a regular sinus rhythm without murmurs or gallops. Lungs are clear to auscultation.        Assessment & Plan:  Sleep apnea  Obesity (BMI 30-39.9) - Plan: CBC with Differential, Comprehensive metabolic panel  Migraine headache  GERD (gastroesophageal reflux disease) - Plan: esomeprazole (NEXIUM) 20 MG capsule  Allergic rhinitis due to pollen  Hyperlipidemia LDL goal < 130 - Plan: Lipid panel  discussed followup with me concerning all of these issues and if she stable, I can refill her medications. Did encourage her to talk to her gynecologist concerning genetic testing for BRCA.

## 2013-06-11 LAB — HEMOGLOBIN A1C
Hgb A1c MFr Bld: 6.6 % — ABNORMAL HIGH (ref <5.7)
Mean Plasma Glucose: 143 mg/dL — ABNORMAL HIGH (ref <117)

## 2013-06-15 NOTE — Progress Notes (Signed)
Quick Note:  CALLED PT CELL # LEFT WORD FOR WORD MESSAGE TO PLEASE CALL AND MAKE APPOINTMENT ______

## 2013-10-27 ENCOUNTER — Ambulatory Visit (INDEPENDENT_AMBULATORY_CARE_PROVIDER_SITE_OTHER): Payer: BC Managed Care – PPO | Admitting: Internal Medicine

## 2013-10-27 ENCOUNTER — Ambulatory Visit: Payer: BC Managed Care – PPO

## 2013-10-27 VITALS — BP 120/80 | HR 66 | Temp 98.4°F | Resp 16 | Ht 65.5 in | Wt 193.0 lb

## 2013-10-27 DIAGNOSIS — M5416 Radiculopathy, lumbar region: Secondary | ICD-10-CM

## 2013-10-27 DIAGNOSIS — M545 Low back pain, unspecified: Secondary | ICD-10-CM

## 2013-10-27 DIAGNOSIS — IMO0002 Reserved for concepts with insufficient information to code with codable children: Secondary | ICD-10-CM

## 2013-10-27 LAB — POCT UA - MICROSCOPIC ONLY
Casts, Ur, LPF, POC: NEGATIVE
Crystals, Ur, HPF, POC: NEGATIVE
Epithelial cells, urine per micros: NEGATIVE
Mucus, UA: NEGATIVE
Yeast, UA: NEGATIVE

## 2013-10-27 LAB — POCT CBC
Granulocyte percent: 48 %G (ref 37–80)
HCT, POC: 42.6 % (ref 37.7–47.9)
Hemoglobin: 13.3 g/dL (ref 12.2–16.2)
Lymph, poc: 2.6 (ref 0.6–3.4)
MCH, POC: 27.9 pg (ref 27–31.2)
MCHC: 31.2 g/dL — AB (ref 31.8–35.4)
MCV: 89.3 fL (ref 80–97)
MID (cbc): 0.3 (ref 0–0.9)
MPV: 8.2 fL (ref 0–99.8)
POC Granulocyte: 2.7 (ref 2–6.9)
POC LYMPH PERCENT: 46.3 %L (ref 10–50)
POC MID %: 5.7 %M (ref 0–12)
Platelet Count, POC: 343 10*3/uL (ref 142–424)
RBC: 4.77 M/uL (ref 4.04–5.48)
RDW, POC: 13.1 %
WBC: 5.6 10*3/uL (ref 4.6–10.2)

## 2013-10-27 LAB — POCT URINALYSIS DIPSTICK
Bilirubin, UA: NEGATIVE
Blood, UA: NEGATIVE
Glucose, UA: NEGATIVE
Ketones, UA: NEGATIVE
Leukocytes, UA: NEGATIVE
Nitrite, UA: NEGATIVE
Protein, UA: NEGATIVE
Spec Grav, UA: 1.025
Urobilinogen, UA: 1
pH, UA: 5.5

## 2013-10-27 LAB — POCT SEDIMENTATION RATE: POCT SED RATE: 25 mm/hr — AB (ref 0–22)

## 2013-10-27 NOTE — Progress Notes (Signed)
Subjective:    Patient ID: Karen Hicks, female    DOB: 1970-11-08, 43 y.o.   MRN: [id number]  HPI 43 y.o. Female presents to clinic with lower back pain that started yesterday. Noticed that pain started to radiate down the back of legs after getting off work yesterday. States that she has discomfort with sitting and laying. Denies any pain of discomfort with urination. Denies any trouble with bowels. Also reports having a stinging , sharp pressure in her chest which she noticed this morning upon getting out of bed. Denies any pain radiating down her arm or in shoulder blades. 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 Has not been lifting anything heavier then a laundry basket the past few days. Denies having any fever or chills. Reports not having any previous back problems.   Review of Systems     Objective:   Physical Exam  Constitutional: She is oriented to person, place, and time. She appears well-developed and well-nourished. No distress.  HENT:  Head: Normocephalic.  Eyes: EOM are normal. No scleral icterus.  Neck: Normal range of motion. Neck supple.  Cardiovascular: Normal rate.   Pulmonary/Chest: Effort normal and breath sounds normal.  Abdominal: Soft. Bowel sounds are normal. She exhibits no mass. There is tenderness. There is no rebound and no guarding.  Musculoskeletal: She exhibits tenderness.       Lumbar back: She exhibits tenderness, bony tenderness, pain and spasm. She exhibits normal range of motion, no swelling, no edema, no deformity, no laceration and normal pulse.  Neurological: She is alert and oriented to person, place, and time. She has normal strength. No cranial nerve deficit or sensory deficit. She exhibits normal muscle tone. She displays a negative Romberg sign. Coordination normal.  Skin: No rash noted. She is not diaphoretic.  Psychiatric: She has a normal mood and affect. Her behavior is  normal. Thought content normal.     Results for orders placed in visit on 10/27/13  POCT UA - MICROSCOPIC ONLY      Result Value Ref Range   WBC, Ur, HPF, POC 0-1     RBC, urine, microscopic 0-1     Bacteria, U Microscopic 1-2     Mucus, UA neg     Epithelial cells, urine per micros neg     Crystals, Ur, HPF, POC neg     Casts, Ur, LPF, POC neg     Yeast, UA neg    POCT URINALYSIS DIPSTICK      Result Value Ref Range   Color, UA yellow     Clarity, UA clear     Glucose, UA neg     Bilirubin, UA neg     Ketones, UA neg     Spec Grav, UA 1.025     Blood, UA neg     pH, UA 5.5     Protein, UA neg     Urobilinogen, UA 1.0     Nitrite, UA neg     Leukocytes, UA Negative    POCT CBC      Result Value Ref Range   WBC 5.6  4.6 - 10.2 K/uL   Lymph, poc 2.6  0.6 - 3.4   POC LYMPH PERCENT 46.3  10 - 50 %L   MID (cbc) 0.3  0 - 0.9   POC MID % 5.7  0 - 12 %M   POC Granulocyte 2.7  2 - 6.9  Granulocyte percent 48.0  37 - 80 %G   RBC 4.77  4.04 - 5.48 M/uL   Hemoglobin 13.3  12.2 - 16.2 g/dL   HCT, POC 91.442.6  78.237.7 - 47.9 %   MCV 89.3  80 - 97 fL   MCH, POC 27.9  27 - 31.2 pg   MCHC 31.2 (*) 31.8 - 35.4 g/dL   RDW, POC 95.613.1     Platelet Count, POC 343  142 - 424 K/uL   MPV 8.2  0 - 99.8 fL    [facility] reading (PRIMARY) by  Dr.Ilda Laskin normal back xr      Assessment & Plan:  LS pain/Probable strain Use your baclofen prn Back exercises

## 2013-10-27 NOTE — Patient Instructions (Signed)
Use bsck manual

## 2013-11-30 ENCOUNTER — Telehealth: Payer: Self-pay | Admitting: Internal Medicine

## 2013-11-30 MED ORDER — TOPIRAMATE 25 MG PO TABS: 25.0000 mg | ORAL_TABLET | Freq: Three times a day (TID) | ORAL | Status: DC

## 2013-11-30 MED ORDER — BACLOFEN 10 MG PO TABS: 10.0000 mg | ORAL_TABLET | Freq: Three times a day (TID) | ORAL | Status: DC

## 2013-11-30 NOTE — Telephone Encounter (Signed)
Pt states she needs a refill on topiramate 25mg  3 tablets at bedtime #90, baclofen 10mg  [facility] [address]

## 2014-03-13 ENCOUNTER — Encounter (HOSPITAL_COMMUNITY): Payer: Self-pay | Admitting: Emergency Medicine

## 2014-03-13 ENCOUNTER — Emergency Department (HOSPITAL_COMMUNITY)
Admission: EM | Admit: 2014-03-13 | Discharge: 2014-03-13 | Disposition: A | Discharge status: Home / Self Care | Payer: BC Managed Care – PPO | Source: Home / Self Care

## 2014-03-13 DIAGNOSIS — G4489 Other headache syndrome: Secondary | ICD-10-CM

## 2014-03-13 DIAGNOSIS — G43009 Migraine without aura, not intractable, without status migrainosus: Secondary | ICD-10-CM

## 2014-03-13 MED ORDER — ONDANSETRON 4 MG PO TBDP
4.0000 mg | ORAL_TABLET | Freq: Once | ORAL | Status: AC
  Administered 2014-03-13: 4 mg via ORAL

## 2014-03-13 MED ORDER — KETOROLAC TROMETHAMINE 60 MG/2ML IM SOLN
INTRAMUSCULAR | Status: AC
  Filled 2014-03-13: qty 2

## 2014-03-13 MED ORDER — METOCLOPRAMIDE HCL 5 MG/ML IJ SOLN
INTRAMUSCULAR | Status: AC
  Filled 2014-03-13: qty 2

## 2014-03-13 MED ORDER — METOCLOPRAMIDE HCL 5 MG/ML IJ SOLN
10.0000 mg | Freq: Once | INTRAMUSCULAR | Status: AC
  Administered 2014-03-13: 10 mg via INTRAMUSCULAR

## 2014-03-13 MED ORDER — DEXAMETHASONE SODIUM PHOSPHATE 10 MG/ML IJ SOLN
10.0000 mg | Freq: Once | INTRAMUSCULAR | Status: AC
  Administered 2014-03-13: 10 mg via INTRAMUSCULAR

## 2014-03-13 MED ORDER — DEXAMETHASONE SODIUM PHOSPHATE 10 MG/ML IJ SOLN
INTRAMUSCULAR | Status: AC
  Filled 2014-03-13: qty 1

## 2014-03-13 MED ORDER — KETOROLAC TROMETHAMINE 60 MG/2ML IM SOLN
60.0000 mg | Freq: Once | INTRAMUSCULAR | Status: AC
  Administered 2014-03-13: 60 mg via INTRAMUSCULAR

## 2014-03-13 MED ORDER — ONDANSETRON 4 MG PO TBDP
ORAL_TABLET | ORAL | Status: AC
  Filled 2014-03-13: qty 1

## 2014-03-13 NOTE — ED Provider Notes (Signed)
CSN: [id number]     Arrival date & time 03/13/14  1816 History   First MD Initiated Contact with Patient 03/13/14 1911     Chief Complaint  Patient presents with  . Headache   (Consider location/radiation/quality/duration/timing/severity/associated sxs/prior Treatment) HPI Comments: 43 year old female complaining of a migraine headache. She states she has a headache on a daily basis but rates it as a 1-3/10. For the past 3 days her headache is calm significantly worse rating the morning pain 3-5 and pain tonight 9 out of 10. She states this headache is no different from the other ones his chest on a more severe headache she has had in a while. The pain is located behind the left eye, the left temple and the left occiput. It is associated with photophobia and vomiting once today. She denies problems with vision, speech, hearing or swallowing. Denies focal paresthesias or weakness.   Past Medical History  Diagnosis Date  . Allergy   . Sleep apnea   . GERD (gastroesophageal reflux disease)   . Lactose intolerance   . Migraine    Past Surgical History  Procedure Laterality Date  . Abdominal hysterectomy  01/2006    FIBROIDS  . Knee surgery Bilateral     multiple   Family History  Problem Relation Age of Onset  . Arthritis Mother   . Diabetes Mother   . Heart disease Mother   . Hypertension Mother   . Kidney disease Mother   . Stroke Mother   . Gallbladder disease Mother   . Glaucoma Mother   . Seizures Mother   . Arthritis Father   . Diabetes Father   . Hypertension Father   . Kidney disease Father   . Gallbladder disease Father    History  Substance Use Topics  . Smoking status: Never Smoker   . Smokeless tobacco: Never Used  . Alcohol Use: Yes   OB History   Grav Para Term Preterm Abortions TAB SAB Ect Mult Living                 Review of Systems  Constitutional: Positive for activity change. Negative for fever.  Eyes: Positive for pain. Negative for discharge  and visual disturbance.  Respiratory: Negative.   Cardiovascular: Negative.   Gastrointestinal: Positive for nausea and vomiting.  Genitourinary: Negative.   Musculoskeletal: Negative.   Skin: Negative.   Neurological: Positive for headaches. Negative for dizziness, tremors, seizures, syncope, facial asymmetry, speech difficulty, weakness, light-headedness and numbness.    Allergies  Penicillins  Home Medications   Prior to Admission medications   Medication Sig Start Date End Date Taking? Authorizing Provider  baclofen (LIORESAL) 10 MG tablet Take 1 tablet (10 mg total) by mouth 3 (three) times daily. 11/30/13   Ronnald NianJohn C Lalonde, MD  cephALEXin (KEFLEX) 500 MG capsule Take 500 mg by mouth 4 (four) times daily.    Historical Provider, MD  esomeprazole (NEXIUM) 20 MG capsule Take 1 capsule (20 mg total) by mouth daily before breakfast. 06/10/13   Ronnald NianJohn C Lalonde, MD  topiramate (TOPAMAX) 25 MG tablet Take 1 tablet (25 mg total) by mouth 3 (three) times daily. 11/30/13   Ronnald NianJohn C Lalonde, MD   BP 128/63  Pulse 68  Temp(Src) 98.6 F (37 C) (Oral)  Resp 18  SpO2 99% Physical Exam  Nursing note and vitals reviewed. Constitutional: She is oriented to person, place, and time. She appears well-developed and well-nourished. No distress.  HENT:  Mouth/Throat: Oropharynx is clear and  moist. No oropharyngeal exudate.  Soft palate rises symmetrically, tongue midline control to left and right Tenderness to the R temple and L occiput at the insertion of a scalene muscle. Stretching the muscle with specific head movement reproduces the pain.   Eyes: Conjunctivae and EOM are normal. Pupils are equal, round, and reactive to light.  Neck: Normal range of motion. Neck supple.  Cardiovascular: Normal rate, regular rhythm and normal heart sounds.   Pulmonary/Chest: Effort normal and breath sounds normal. No respiratory distress. She has no wheezes. She has no rales.  Musculoskeletal: Normal range of motion.  She exhibits no edema and no tenderness.  Lymphadenopathy:    She has no cervical adenopathy.  Neurological: She is alert and oriented to person, place, and time. She has normal strength. She displays no tremor. No cranial nerve deficit or sensory deficit. She exhibits normal muscle tone. Coordination normal. GCS eye subscore is 4. GCS verbal subscore is 5. GCS motor subscore is 6.  Skin: Skin is warm and dry. No erythema.  Psychiatric: She has a normal mood and affect.    ED Course  Procedures (including critical care time) Labs Review Labs Reviewed - No data to display  Imaging Review No results found.   MDM   1. Nonintractable migraine, unspecified migraine type   2. Chronic mixed headache syndrome     Reglan 10 mg IM Toradol 60 mg IM Decadron 10 mg IM Zofran 4 mg by mouth Home, rest and follow up with PCP Monday. For worsening new symptoms problems or deficits go to the emergency department. D/C stable condition. Not much change in condition but is early post meds. Home rest, heat to back of head.       Hayden Rasmussen, NP 03/13/14 1947

## 2014-03-13 NOTE — Discharge Instructions (Signed)
Migraine Headache A migraine headache is an intense, throbbing pain on one or both sides of your head. A migraine can last for 30 minutes to several hours. CAUSES  The exact cause of a migraine headache is not always known. However, a migraine may be caused when nerves in the brain become irritated and release chemicals that cause inflammation. This causes pain. Certain things may also trigger migraines, such as:  Alcohol.  Smoking.  Stress.  Menstruation.  Aged cheeses.  Foods or drinks that contain nitrates, glutamate, aspartame, or tyramine.  Lack of sleep.  Chocolate.  Caffeine.  Hunger.  Physical exertion.  Fatigue.  Medicines used to treat chest pain (nitroglycerine), birth control pills, estrogen, and some blood pressure medicines. SIGNS AND SYMPTOMS  Pain on one or both sides of your head.  Pulsating or throbbing pain.  Severe pain that prevents daily activities.  Pain that is aggravated by any physical activity.  Nausea, vomiting, or both.  Dizziness.  Pain with exposure to bright lights, loud noises, or activity.  General sensitivity to bright lights, loud noises, or smells. Before you get a migraine, you may get warning signs that a migraine is coming (aura). An aura may include:  Seeing flashing lights.  Seeing bright spots, halos, or zig-zag lines.  Having tunnel vision or blurred vision.  Having feelings of numbness or tingling.  Having trouble talking.  Having muscle weakness. DIAGNOSIS  A migraine headache is often diagnosed based on:  Symptoms.  Physical exam.  A CT scan or MRI of your head. These imaging tests cannot diagnose migraines, but they can help rule out other causes of headaches. TREATMENT Medicines may be given for pain and nausea. Medicines can also be given to help prevent recurrent migraines.  HOME CARE INSTRUCTIONS  Only take over-the-counter or prescription medicines for pain or discomfort as directed by your  health care provider. The use of long-term narcotics is not recommended.  Lie down in a dark, quiet room when you have a migraine.  Keep a journal to find out what may trigger your migraine headaches. For example, write down:  What you eat and drink.  How much sleep you get.  Any change to your diet or medicines.  Limit alcohol consumption.  Quit smoking if you smoke.  Get 7-9 hours of sleep, or as recommended by your health care provider.  Limit stress.  Keep lights dim if bright lights bother you and make your migraines worse. SEEK IMMEDIATE MEDICAL CARE IF:   Your migraine becomes severe.  You have a fever.  You have a stiff neck.  You have vision loss.  You have muscular weakness or loss of muscle control.  You start losing your balance or have trouble walking.  You feel faint or pass out.  You have severe symptoms that are different from your first symptoms. MAKE SURE YOU:   Understand these instructions.  Will watch your condition.  Will get help right away if you are not doing well or get worse. Document Released: 08/20/2005 Document Revised: 06/10/2013 Document Reviewed: 04/27/2013 [facility] Patient Information 2015 [employer]. This information is not intended to replace advice given to you by your health care provider. Make sure you discuss any questions you have with your health care provider.  I

## 2014-03-13 NOTE — ED Notes (Signed)
Patient has a history of migraines.  Reports this one is no different in location-usually left side of head.  This headache is not responding to usually remedies.  Patient reports it has been a very long time since she has had a headache of a 9/10.  Reports maintenance medicines have been controlling headaches .  Patient of dr Linna Capricelalonde-pcp, and dr Jen[facility]eman[facility].  Patient has had light sensitivity, nausea, and vomiting.

## 2014-03-15 NOTE — ED Provider Notes (Signed)
Medical screening examination/treatment/procedure(s) were performed by non-physician practitioner and as supervising physician I was immediately available for consultation/collaboration.  Leslee Homeavid Glenford Garis, M.D.  Reuben Likesavid C Jerlisa Diliberto, MD 03/15/14 [id number]

## 2014-07-08 ENCOUNTER — Other Ambulatory Visit: Payer: Self-pay | Admitting: Orthopaedic Surgery

## 2014-07-19 HISTORY — PX: OTHER SURGICAL HISTORY: SHX169

## 2014-07-21 ENCOUNTER — Encounter: Payer: Self-pay | Admitting: Internal Medicine

## 2014-07-21 DIAGNOSIS — M1990 Unspecified osteoarthritis, unspecified site: Secondary | ICD-10-CM

## 2014-07-22 ENCOUNTER — Encounter (HOSPITAL_COMMUNITY)
Admission: RE | Admit: 2014-07-22 | Discharge: 2014-07-22 | Disposition: A | Discharge status: Home / Self Care | Payer: BC Managed Care – PPO | Source: Ambulatory Visit | Attending: Orthopaedic Surgery | Admitting: Orthopaedic Surgery

## 2014-07-22 ENCOUNTER — Encounter (HOSPITAL_COMMUNITY): Payer: Self-pay

## 2014-07-22 ENCOUNTER — Ambulatory Visit (HOSPITAL_COMMUNITY)
Admission: RE | Admit: 2014-07-22 | Discharge: 2014-07-22 | Disposition: A | Discharge status: Home / Self Care | Payer: BC Managed Care – PPO | Source: Ambulatory Visit | Attending: Orthopaedic Surgery | Admitting: Orthopaedic Surgery

## 2014-07-22 DIAGNOSIS — M179 Osteoarthritis of knee, unspecified: Secondary | ICD-10-CM | POA: Diagnosis not present

## 2014-07-22 DIAGNOSIS — Z01818 Encounter for other preprocedural examination: Secondary | ICD-10-CM | POA: Diagnosis present

## 2014-07-22 HISTORY — DX: Other specified postprocedural states: R11.2

## 2014-07-22 HISTORY — DX: Other specified postprocedural states: Z98.890

## 2014-07-22 HISTORY — DX: Other complications of anesthesia, initial encounter: T88.59XA

## 2014-07-22 HISTORY — DX: Adverse effect of unspecified anesthetic, initial encounter: T41.45XA

## 2014-07-22 HISTORY — DX: Anemia, unspecified: D64.9

## 2014-07-22 HISTORY — DX: Other pulmonary embolism without acute cor pulmonale: I26.99

## 2014-07-22 LAB — CBC WITH DIFFERENTIAL/PLATELET
Basophils Absolute: 0 10*3/uL (ref 0.0–0.1)
Basophils Relative: 1 % (ref 0–1)
Eosinophils Absolute: 0.1 10*3/uL (ref 0.0–0.7)
Eosinophils Relative: 2 % (ref 0–5)
HCT: 38 % (ref 36.0–46.0)
Hemoglobin: 12.5 g/dL (ref 12.0–15.0)
Lymphocytes Relative: 44 % (ref 12–46)
Lymphs Abs: 2.9 10*3/uL (ref 0.7–4.0)
MCH: 27.6 pg (ref 26.0–34.0)
MCHC: 32.9 g/dL (ref 30.0–36.0)
MCV: 83.9 fL (ref 78.0–100.0)
Monocytes Absolute: 0.4 10*3/uL (ref 0.1–1.0)
Monocytes Relative: 6 % (ref 3–12)
Neutro Abs: 3.2 10*3/uL (ref 1.7–7.7)
Neutrophils Relative %: 47 % (ref 43–77)
Platelets: 325 10*3/uL (ref 150–400)
RBC: 4.53 MIL/uL (ref 3.87–5.11)
RDW: 13.5 % (ref 11.5–15.5)
WBC: 6.7 10*3/uL (ref 4.0–10.5)

## 2014-07-22 LAB — SURGICAL PCR SCREEN
MRSA, PCR: NEGATIVE
Staphylococcus aureus: NEGATIVE

## 2014-07-22 LAB — BASIC METABOLIC PANEL
Anion gap: 13 (ref 5–15)
BUN: 9 mg/dL (ref 6–23)
CO2: 25 mEq/L (ref 19–32)
Calcium: 9.1 mg/dL (ref 8.4–10.5)
Chloride: 104 mEq/L (ref 96–112)
Creatinine, Ser: 0.85 mg/dL (ref 0.50–1.10)
GFR calc Af Amer: >90 mL/min (ref >90)
GFR calc non Af Amer: 83 mL/min — ABNORMAL LOW (ref >90)
Glucose, Bld: 76 mg/dL (ref 70–99)
Potassium: 4 mEq/L (ref 3.7–5.3)
Sodium: 142 mEq/L (ref 137–147)

## 2014-07-22 LAB — PROTIME-INR
INR: 1.03 (ref 0.00–1.49)
Prothrombin Time: 13.7 seconds (ref 11.6–15.2)

## 2014-07-22 LAB — APTT: aPTT: 29 seconds (ref 24–37)

## 2014-07-22 NOTE — Pre-Procedure Instructions (Signed)
Karen Hicks  07/22/2014   Your procedure is scheduled on:  Tuesday, Dec. 1st.   Please call [phone] around 7 AM in the morning to find out arrival time.  Call this number if you have problems the morning of surgery: [phone]   Remember:   Do not eat food or drink liquids after midnight Monday.   Take these medicines the morning of surgery with A SIP OF WATER: Hydrocodone, Nexium, Baclofen if needed for headache.   Do not wear jewelry, make-up or nail polish.  Do not wear lotions, powders, or perfumes. You may NOT wear deodorant the day of surgery.  Do not shave underarms & legs 48 hours prior to surgery.    Do not bring valuables to the hospital.  [facility] any belongings or valuables.               Contacts, dentures or bridgework may not be worn into surgery.  Leave suitcase in the car. After surgery it may be brought to your room.  For patients admitted to the hospital, discharge time is determined by your treatment team.    Name and phone number of your driver:    Special Instructions: "Preparing for Surgery" instruction sheet.   Please read over the following fact sheets that you were given: Pain Booklet, Coughing and Deep Breathing, MRSA Information and Surgical Site Infection Prevention

## 2014-07-22 NOTE — Progress Notes (Addendum)
Patient does not guarantee that the blood band will stay on.  She understands that she will have a T & S drawn the DOS. She was tested for OSA, came back positive, but wears the mask only now and then.  Dr. Susann GivensLaLonde is aware of this. She has had, with in the last 5 yrs a stress test & ekg, for palpitations-- "in [facility] red building [facility] there'...[facility] (? Dr. Mairen Wallenstein Chalkennant) She denies any cardiac issues now and has not see a card doc in awhile.

## 2014-07-23 LAB — URINE CULTURE: Colony Count: 30000

## 2014-08-02 MED ORDER — VANCOMYCIN HCL IN DEXTROSE 1-5 GM/200ML-% IV SOLN
1000.0000 mg | INTRAVENOUS | Status: AC
  Administered 2014-08-03: 1000 mg via INTRAVENOUS
  Filled 2014-08-02: qty 200

## 2014-08-02 MED ORDER — LACTATED RINGERS IV SOLN
INTRAVENOUS | Status: DC
Start: 2014-08-02 — End: 2014-08-03

## 2014-08-02 NOTE — H&P (Signed)
TOTAL KNEE REVISION ADMISSION H&P  Patient is being admitted for left total knee arthroplasty with hardware removal.  Subjective:  Chief Complaint:left knee pain.  HPI: Karen Hicks, 43 y.o. female, has a history of pain and functional disability in the left knee(s) due to arthritis and patient has failed non-surgical conservative treatments for greater than 12 weeks to include NSAID's and/or analgesics, corticosteriod injections, flexibility and strengthening excercises, supervised PT with diminished ADL's post treatment, weight reduction as appropriate and activity modification. Onset of symptoms was gradual starting 5 years ago with gradually worsening course since that time.  Prior procedures on the left knee(s) include arthroscopy and ACL reconstruction.  Patient currently rates pain in the left knee(s) at 10 out of 10 with activity. There is night pain, worsening of pain with activity and weight bearing, pain that interferes with activities of daily living, crepitus and joint swelling.  Patient has evidence of subchondral cysts, subchondral sclerosis, periarticular osteophytes and joint space narrowing by imaging studies. This condition presents safety issues increasing the risk of falls. There is no current active infection.  Patient Active Problem List   Diagnosis Date Noted  . Arthritis 07/21/2014  . GERD (gastroesophageal reflux disease) 06/12/2011  . Sleep apnea 06/12/2011  . Obesity (BMI 30-39.9) 06/12/2011  . Allergic rhinitis due to pollen 06/12/2011  . Migraine headache 06/12/2011   Past Medical History  Diagnosis Date  . Allergy   . GERD (gastroesophageal reflux disease)   . Lactose intolerance   . Migraine   . Arthritis   . Complication of anesthesia     takes her alittle longer to wake up  . PONV (postoperative nausea and vomiting)   . PE (pulmonary embolism)     in 1994 meniscus repair.  Saw Dr. Susann GivensLaLonde  . Anemia     when she was young  . Sleep apnea      2007.  Uses the mask & machine when she needs it.  Dr. Susann GivensLalonde is aware    Past Surgical History  Procedure Laterality Date  . Abdominal hysterectomy  01/2006    FIBROIDS  . Knee surgery Bilateral     multiple  . 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 Right knee moderate djd with multiple surgeries  07/19/14  . Right wrist arthroscopy  2009    No prescriptions prior to admission   Allergies  Allergen Reactions  . Mushroom Extract Complex Anaphylaxis  . Peach Flavor Anaphylaxis  . Adhesive [Tape] Hives  . Penicillins     Tingling and shaking    History  Substance Use Topics  . Smoking status: Never Smoker   . Smokeless tobacco: Never Used  . Alcohol Use: Yes     Comment: "with a good steak"    Family History  Problem Relation Age of Onset  . Arthritis Mother   . Diabetes Mother   . Heart disease Mother   . Hypertension Mother   . Kidney disease Mother   . Stroke Mother   . Gallbladder disease Mother   . Glaucoma Mother   . Seizures Mother   . Arthritis Father   . Diabetes Father   . Hypertension Father   . Kidney disease Father   . Gallbladder disease Father       Review of Systems  Musculoskeletal: Positive for joint pain.       Left knee  All other systems reviewed and are negative.    Objective:  Physical Exam  Constitutional: She  is oriented to person, place, and time. She appears well-developed and well-nourished.  HENT:  Head: Normocephalic and atraumatic.  Eyes: Conjunctivae are normal. Pupils are equal, round, and reactive to light.  Neck: Normal range of motion.  Cardiovascular: Normal rate and regular rhythm.   Respiratory: Effort normal.  GI: Soft.  Musculoskeletal:  Left knee motion is 0-120. There is no effusion with significant crepitation. She has one transverse incision at the mid patella and one longitudinal incision near the tibial tubercle. Hip motion is full and pain free and SLR is negative on both sides.   There is no palpable LAD behind either knee.   Neurological: She is alert and oriented to person, place, and time.  Skin: Skin is warm and dry.  Psychiatric: She has a normal mood and affect. Her behavior is normal. Judgment and thought content normal.    Vital signs in last 24 hours:    Labs:  Estimated body mass index is 31.62 kg/(m^2) as calculated from the following:   Height as of 10/27/13: 5' 5.5" (1.664 m).   Weight as of 10/27/13: 87.544 kg (193 lb).  Imaging Review Plain radiographs demonstrate severe degenerative joint disease of the left knee(s). The overall alignment is neutral. The bone quality appears to be good for age and reported activity level. There is also retained hardware from prior ACL surgeries.  Assessment/Plan:  End stage primary arthritis, left knee(s) with retained hardware from prior ACL surgery.   The patient history, physical examination, clinical judgment of the provider and imaging studies are consistent with end stage degenerative joint disease of the left knee(s), previous total knee arthroplasty. Revision total knee arthroplasty is deemed medically necessary. The treatment options including medical management, injection therapy, arthroscopy and revision arthroplasty were discussed at length. The risks and benefits of revision total knee arthroplasty were presented and reviewed. The risks due to aseptic loosening, infection, stiffness, patella tracking problems, thromboembolic complications and other imponderables were discussed. The patient acknowledged the explanation, agreed to proceed with the plan and consent was signed. Patient is being admitted for inpatient treatment for surgery, pain control, PT, OT, prophylactic antibiotics, VTE prophylaxis, progressive ambulation and ADL's and discharge planning.The patient is planning to be discharged home with home health services

## 2014-08-02 NOTE — Progress Notes (Signed)
Left message for patient to arrive at 11:45 

## 2014-08-02 NOTE — Anesthesia Preprocedure Evaluation (Addendum)
Anesthesia Evaluation  Patient identified by MRN, date of birth, ID band Patient awake    Reviewed: Allergy & Precautions, H&P , NPO status , Patient's Chart, lab work & pertinent test results, reviewed documented beta blocker date and time   History of Anesthesia Complications (+) PONV  Airway Mallampati: II       Dental  (+) Edentulous Upper   Pulmonary sleep apnea ,  breath sounds clear to auscultation        Cardiovascular Rhythm:Regular  EKG WNL   Neuro/Psych    GI/Hepatic GERD-  ,  Endo/Other    Renal/GU      Musculoskeletal   Abdominal (+)  Abdomen: soft.    Peds  Hematology   Anesthesia Other Findings   Reproductive/Obstetrics                            Anesthesia Physical Anesthesia Plan  ASA: III  Anesthesia Plan: Spinal   Post-op Pain Management: MAC Combined w/ Regional for Post-op pain   Induction: Intravenous  Airway Management Planned: LMA and Oral ETT  Additional Equipment:   Intra-op Plan:   Post-operative Plan: Extubation in OR  Informed Consent: I have reviewed the patients History and Physical, chart, labs and discussed the procedure including the risks, benefits and alternatives for the proposed anesthesia with the patient or authorized representative who has indicated his/her understanding and acceptance.     Plan Discussed with:   Anesthesia Plan Comments:        Anesthesia Quick Evaluation

## 2014-08-03 ENCOUNTER — Inpatient Hospital Stay (HOSPITAL_COMMUNITY): Payer: BC Managed Care – PPO | Admitting: Vascular Surgery

## 2014-08-03 ENCOUNTER — Inpatient Hospital Stay (HOSPITAL_COMMUNITY)
Admission: RE | Admit: 2014-08-03 | Discharge: 2014-08-05 | DRG: 470 | Disposition: A | Discharge status: Home / Self Care | Payer: BC Managed Care – PPO | Source: Ambulatory Visit | Attending: Orthopaedic Surgery | Admitting: Orthopaedic Surgery

## 2014-08-03 ENCOUNTER — Encounter (HOSPITAL_COMMUNITY): Payer: Self-pay | Admitting: Anesthesiology

## 2014-08-03 ENCOUNTER — Inpatient Hospital Stay (HOSPITAL_COMMUNITY): Payer: BC Managed Care – PPO | Admitting: Anesthesiology

## 2014-08-03 ENCOUNTER — Encounter (HOSPITAL_COMMUNITY)
Admission: RE | Disposition: A | Discharge status: Home / Self Care | Payer: Self-pay | Source: Ambulatory Visit | Attending: Orthopaedic Surgery

## 2014-08-03 DIAGNOSIS — E669 Obesity, unspecified: Secondary | ICD-10-CM | POA: Diagnosis present

## 2014-08-03 DIAGNOSIS — J301 Allergic rhinitis due to pollen: Secondary | ICD-10-CM | POA: Diagnosis present

## 2014-08-03 DIAGNOSIS — G43909 Migraine, unspecified, not intractable, without status migrainosus: Secondary | ICD-10-CM | POA: Diagnosis present

## 2014-08-03 DIAGNOSIS — D649 Anemia, unspecified: Secondary | ICD-10-CM | POA: Diagnosis present

## 2014-08-03 DIAGNOSIS — Z6830 Body mass index (BMI) 30.0-30.9, adult: Secondary | ICD-10-CM | POA: Diagnosis not present

## 2014-08-03 DIAGNOSIS — K219 Gastro-esophageal reflux disease without esophagitis: Secondary | ICD-10-CM | POA: Diagnosis present

## 2014-08-03 DIAGNOSIS — G4730 Sleep apnea, unspecified: Secondary | ICD-10-CM | POA: Diagnosis present

## 2014-08-03 DIAGNOSIS — M1712 Unilateral primary osteoarthritis, left knee: Principal | ICD-10-CM | POA: Diagnosis present

## 2014-08-03 HISTORY — PX: HARDWARE REMOVAL: SHX979

## 2014-08-03 HISTORY — PX: TOTAL KNEE ARTHROPLASTY: SHX125

## 2014-08-03 LAB — TYPE AND SCREEN
ABO/RH(D): B POS
Antibody Screen: NEGATIVE

## 2014-08-03 LAB — ABO/RH: ABO/RH(D): B POS

## 2014-08-03 SURGERY — ARTHROPLASTY, KNEE, TOTAL
Anesthesia: Spinal | Site: Knee | Laterality: Left

## 2014-08-03 MED ORDER — BACLOFEN 10 MG PO TABS
10.0000 mg | ORAL_TABLET | Freq: Three times a day (TID) | ORAL | Status: DC | PRN
  Filled 2014-08-03: qty 1

## 2014-08-03 MED ORDER — DEXAMETHASONE SODIUM PHOSPHATE 4 MG/ML IJ SOLN
INTRAMUSCULAR | Status: DC | PRN
  Administered 2014-08-03: 4 mg via INTRAVENOUS

## 2014-08-03 MED ORDER — FENTANYL CITRATE 0.05 MG/ML IJ SOLN
INTRAMUSCULAR | Status: DC
Start: 2014-08-03 — End: 2014-08-03
  Filled 2014-08-03: qty 2

## 2014-08-03 MED ORDER — DOCUSATE SODIUM 100 MG PO CAPS
100.0000 mg | ORAL_CAPSULE | Freq: Two times a day (BID) | ORAL | Status: DC
Start: 2014-08-03 — End: 2014-08-05
  Administered 2014-08-03 – 2014-08-05 (×4): 100 mg via ORAL
  Filled 2014-08-03 (×5): qty 1

## 2014-08-03 MED ORDER — HYDROMORPHONE HCL 1 MG/ML IJ SOLN
0.5000 mg | INTRAMUSCULAR | Status: DC | PRN
  Administered 2014-08-03 – 2014-08-05 (×5): 1 mg via INTRAVENOUS
  Filled 2014-08-03 (×5): qty 1

## 2014-08-03 MED ORDER — ACETAMINOPHEN 650 MG RE SUPP: 650.0000 mg | Freq: Four times a day (QID) | RECTAL | Status: DC | PRN

## 2014-08-03 MED ORDER — METOCLOPRAMIDE HCL 5 MG/ML IJ SOLN: 5.0000 mg | Freq: Three times a day (TID) | INTRAMUSCULAR | Status: DC | PRN

## 2014-08-03 MED ORDER — LIDOCAINE HCL (CARDIAC) 20 MG/ML IV SOLN
INTRAVENOUS | Status: DC | PRN
  Administered 2014-08-03: 30 mg via INTRAVENOUS

## 2014-08-03 MED ORDER — PROMETHAZINE HCL 25 MG/ML IJ SOLN: 6.2500 mg | INTRAMUSCULAR | Status: DC | PRN

## 2014-08-03 MED ORDER — ACETAMINOPHEN 325 MG PO TABS: 650.0000 mg | ORAL_TABLET | Freq: Four times a day (QID) | ORAL | Status: DC | PRN

## 2014-08-03 MED ORDER — MIDAZOLAM HCL 5 MG/5ML IJ SOLN
INTRAMUSCULAR | Status: DC | PRN
  Administered 2014-08-03 (×2): 1 mg via INTRAVENOUS

## 2014-08-03 MED ORDER — CHLORHEXIDINE GLUCONATE 0.12 % MT SOLN
15.0000 mL | Freq: Two times a day (BID) | OROMUCOSAL | Status: DC
  Administered 2014-08-03 – 2014-08-05 (×4): 15 mL via OROMUCOSAL
  Filled 2014-08-03 (×5): qty 15

## 2014-08-03 MED ORDER — FENTANYL CITRATE 0.05 MG/ML IJ SOLN
INTRAMUSCULAR | Status: AC
  Filled 2014-08-03: qty 5

## 2014-08-03 MED ORDER — ONDANSETRON HCL 4 MG PO TABS
4.0000 mg | ORAL_TABLET | Freq: Four times a day (QID) | ORAL | Status: DC | PRN
Start: 2014-08-03 — End: 2014-08-05

## 2014-08-03 MED ORDER — ASPIRIN EC 325 MG PO TBEC
325.0000 mg | DELAYED_RELEASE_TABLET | Freq: Two times a day (BID) | ORAL | Status: DC
  Administered 2014-08-04 – 2014-08-05 (×3): 325 mg via ORAL
  Filled 2014-08-03 (×5): qty 1

## 2014-08-03 MED ORDER — PROPOFOL INFUSION 10 MG/ML OPTIME
INTRAVENOUS | Status: DC | PRN
  Administered 2014-08-03: 25 ug/kg/min via INTRAVENOUS

## 2014-08-03 MED ORDER — FENTANYL CITRATE 0.05 MG/ML IJ SOLN: 25.0000 ug | INTRAMUSCULAR | Status: DC | PRN

## 2014-08-03 MED ORDER — TOPIRAMATE 25 MG PO TABS
25.0000 mg | ORAL_TABLET | Freq: Three times a day (TID) | ORAL | Status: DC
  Administered 2014-08-03 – 2014-08-05 (×5): 25 mg via ORAL
  Filled 2014-08-03 (×7): qty 1

## 2014-08-03 MED ORDER — TRANEXAMIC ACID 100 MG/ML IV SOLN
2000.0000 mg | INTRAVENOUS | Status: DC
  Filled 2014-08-03: qty 20

## 2014-08-03 MED ORDER — METHOCARBAMOL 1000 MG/10ML IJ SOLN
500.0000 mg | Freq: Four times a day (QID) | INTRAVENOUS | Status: DC | PRN
  Filled 2014-08-03: qty 5

## 2014-08-03 MED ORDER — MEPERIDINE HCL 25 MG/ML IJ SOLN: 6.2500 mg | INTRAMUSCULAR | Status: DC | PRN

## 2014-08-03 MED ORDER — MIDAZOLAM HCL 2 MG/2ML IJ SOLN: 1.0000 mg | INTRAMUSCULAR | Status: DC | PRN

## 2014-08-03 MED ORDER — LACTATED RINGERS IV SOLN
INTRAVENOUS | Status: DC | PRN
  Administered 2014-08-03 (×2): via INTRAVENOUS

## 2014-08-03 MED ORDER — LORATADINE 10 MG PO TABS
10.0000 mg | ORAL_TABLET | Freq: Every day | ORAL | Status: DC | PRN
  Administered 2014-08-04: 10 mg via ORAL
  Filled 2014-08-03 (×3): qty 1

## 2014-08-03 MED ORDER — MENTHOL 3 MG MT LOZG: 1.0000 | LOZENGE | OROMUCOSAL | Status: DC | PRN

## 2014-08-03 MED ORDER — MIDAZOLAM HCL 2 MG/2ML IJ SOLN
INTRAMUSCULAR | Status: AC
  Filled 2014-08-03: qty 2

## 2014-08-03 MED ORDER — FENTANYL CITRATE 0.05 MG/ML IJ SOLN
INTRAMUSCULAR | Status: DC | PRN
  Administered 2014-08-03: 50 ug via INTRAVENOUS
  Administered 2014-08-03 (×2): 25 ug via INTRAVENOUS
  Administered 2014-08-03 (×3): 50 ug via INTRAVENOUS

## 2014-08-03 MED ORDER — VANCOMYCIN HCL IN DEXTROSE 1-5 GM/200ML-% IV SOLN
1000.0000 mg | Freq: Two times a day (BID) | INTRAVENOUS | Status: AC
  Administered 2014-08-04: 1000 mg via INTRAVENOUS
  Filled 2014-08-03: qty 200

## 2014-08-03 MED ORDER — OXYCODONE HCL 5 MG PO TABS
5.0000 mg | ORAL_TABLET | ORAL | Status: DC | PRN
  Administered 2014-08-03 – 2014-08-04 (×3): 10 mg via ORAL
  Filled 2014-08-03 (×3): qty 2

## 2014-08-03 MED ORDER — LACTATED RINGERS IV SOLN
INTRAVENOUS | Status: DC
  Administered 2014-08-04: 02:00:00 via INTRAVENOUS

## 2014-08-03 MED ORDER — DIPHENHYDRAMINE HCL 12.5 MG/5ML PO ELIX
12.5000 mg | ORAL_SOLUTION | ORAL | Status: DC | PRN
  Administered 2014-08-04: 25 mg via ORAL
  Filled 2014-08-03: qty 10

## 2014-08-03 MED ORDER — PROPOFOL 10 MG/ML IV BOLUS
INTRAVENOUS | Status: AC
  Filled 2014-08-03: qty 20

## 2014-08-03 MED ORDER — BISACODYL 5 MG PO TBEC: 5.0000 mg | DELAYED_RELEASE_TABLET | Freq: Every day | ORAL | Status: DC | PRN

## 2014-08-03 MED ORDER — SODIUM CHLORIDE 0.9 % IR SOLN
Status: DC | PRN
  Administered 2014-08-03: 3000 mL

## 2014-08-03 MED ORDER — PANTOPRAZOLE SODIUM 40 MG PO TBEC
40.0000 mg | DELAYED_RELEASE_TABLET | Freq: Every day | ORAL | Status: DC
  Administered 2014-08-04 – 2014-08-05 (×2): 40 mg via ORAL
  Filled 2014-08-03 (×2): qty 1

## 2014-08-03 MED ORDER — PHENOL 1.4 % MT LIQD
1.0000 | OROMUCOSAL | Status: DC | PRN
Start: 2014-08-03 — End: 2014-08-05

## 2014-08-03 MED ORDER — ONDANSETRON HCL 4 MG/2ML IJ SOLN
INTRAMUSCULAR | Status: DC | PRN
  Administered 2014-08-03: 4 mg via INTRAVENOUS

## 2014-08-03 MED ORDER — SCOPOLAMINE 1 MG/3DAYS TD PT72: 1.0000 | MEDICATED_PATCH | TRANSDERMAL | Status: DC

## 2014-08-03 MED ORDER — BUPIVACAINE LIPOSOME 1.3 % IJ SUSP
20.0000 mL | INTRAMUSCULAR | Status: DC
  Filled 2014-08-03: qty 20

## 2014-08-03 MED ORDER — BUPIVACAINE HCL (PF) 0.75 % IJ SOLN
INTRAMUSCULAR | Status: DC | PRN
  Administered 2014-08-03: 1.6 mL via INTRATHECAL

## 2014-08-03 MED ORDER — ONDANSETRON HCL 4 MG/2ML IJ SOLN
4.0000 mg | Freq: Four times a day (QID) | INTRAMUSCULAR | Status: DC | PRN
  Administered 2014-08-03: 4 mg via INTRAVENOUS
  Filled 2014-08-03: qty 2

## 2014-08-03 MED ORDER — SODIUM CHLORIDE 0.9 % IV SOLN
INTRAVENOUS | Status: DC | PRN
  Administered 2014-08-03: 40 mL

## 2014-08-03 MED ORDER — METOCLOPRAMIDE HCL 10 MG PO TABS: 5.0000 mg | ORAL_TABLET | Freq: Three times a day (TID) | ORAL | Status: DC | PRN

## 2014-08-03 MED ORDER — FENTANYL CITRATE 0.05 MG/ML IJ SOLN: 50.0000 ug | INTRAMUSCULAR | Status: DC | PRN

## 2014-08-03 MED ORDER — METHOCARBAMOL 500 MG PO TABS
500.0000 mg | ORAL_TABLET | Freq: Four times a day (QID) | ORAL | Status: DC | PRN
Start: 2014-08-03 — End: 2014-08-05
  Administered 2014-08-04 – 2014-08-05 (×4): 500 mg via ORAL
  Filled 2014-08-03 (×5): qty 1

## 2014-08-03 MED ORDER — ALUM & MAG HYDROXIDE-SIMETH 200-200-20 MG/5ML PO SUSP: 30.0000 mL | ORAL | Status: DC | PRN

## 2014-08-03 MED ORDER — TRANEXAMIC ACID 100 MG/ML IV SOLN
2000.0000 mg | INTRAVENOUS | Status: DC | PRN
  Administered 2014-08-03: 2000 mg via TOPICAL

## 2014-08-03 SURGICAL SUPPLY — 85 items
APL SKNCLS STERI-STRIP NONHPOA (GAUZE/BANDAGES/DRESSINGS) ×1
BAG DECANTER FOR FLEXI CONT (MISCELLANEOUS) ×2 IMPLANT
BANDAGE ELASTIC 4 VELCRO ST LF (GAUZE/BANDAGES/DRESSINGS) ×2 IMPLANT
BANDAGE ELASTIC 6 VELCRO ST LF (GAUZE/BANDAGES/DRESSINGS) ×2 IMPLANT
BANDAGE ESMARK 6X9 LF (GAUZE/BANDAGES/DRESSINGS) ×1 IMPLANT
BENZOIN TINCTURE PRP APPL 2/3 (GAUZE/BANDAGES/DRESSINGS) ×2 IMPLANT
BLADE SAGITTAL 25.0X1.19X90 (BLADE) IMPLANT
BLADE SAW SGTL 13.0X1.19X90.0M (BLADE) IMPLANT
BLADE SURG ROTATE 9660 (MISCELLANEOUS) IMPLANT
BNDG CMPR 9X6 STRL LF SNTH (GAUZE/BANDAGES/DRESSINGS) ×1
BNDG CMPR MED 10X6 ELC LF (GAUZE/BANDAGES/DRESSINGS) ×1
BNDG COHESIVE 4X5 TAN STRL (GAUZE/BANDAGES/DRESSINGS) ×2 IMPLANT
BNDG ELASTIC 6X10 VLCR STRL LF (GAUZE/BANDAGES/DRESSINGS) ×2 IMPLANT
BNDG ESMARK 6X9 LF (GAUZE/BANDAGES/DRESSINGS) ×2
BNDG GAUZE ELAST 4 BULKY (GAUZE/BANDAGES/DRESSINGS) ×4 IMPLANT
BOWL SMART MIX CTS (DISPOSABLE) ×2 IMPLANT
CEMENT HV SMART SET (Cement) ×4 IMPLANT
COMP FEM CEM STD LT LCS (Orthopedic Implant) ×2 IMPLANT
COMPONENT FEM CEM STD LT LCS (Orthopedic Implant) IMPLANT
COVER SURGICAL LIGHT HANDLE (MISCELLANEOUS) ×2 IMPLANT
CUFF TOURNIQUET SINGLE 34IN LL (TOURNIQUET CUFF) ×2 IMPLANT
CUFF TOURNIQUET SINGLE 44IN (TOURNIQUET CUFF) IMPLANT
DRAPE C-ARM 42X72 X-RAY (DRAPES) ×2 IMPLANT
DRAPE EXTREMITY T 121X128X90 (DRAPE) ×2 IMPLANT
DRAPE IMP U-DRAPE 54X76 (DRAPES) ×2 IMPLANT
DRAPE INCISE IOBAN 66X45 STRL (DRAPES) ×2 IMPLANT
DRAPE ORTHO SPLIT 77X108 STRL (DRAPES) ×4
DRAPE PROXIMA HALF (DRAPES) ×4 IMPLANT
DRAPE SURG ORHT 6 SPLT 77X108 (DRAPES) ×2 IMPLANT
DRAPE U-SHAPE 47X51 STRL (DRAPES) ×2 IMPLANT
DRSG ADAPTIC 3X8 NADH LF (GAUZE/BANDAGES/DRESSINGS) ×2 IMPLANT
DRSG EMULSION OIL 3X3 NADH (GAUZE/BANDAGES/DRESSINGS) ×2 IMPLANT
DRSG PAD ABDOMINAL 8X10 ST (GAUZE/BANDAGES/DRESSINGS) ×2 IMPLANT
DURAPREP 26ML APPLICATOR (WOUND CARE) ×2 IMPLANT
ELECT REM PT RETURN 9FT ADLT (ELECTROSURGICAL) ×2
ELECTRODE REM PT RTRN 9FT ADLT (ELECTROSURGICAL) ×1 IMPLANT
GAUZE SPONGE 4X4 12PLY STRL (GAUZE/BANDAGES/DRESSINGS) ×2 IMPLANT
GLOVE BIO SURGEON STRL SZ8 (GLOVE) ×8 IMPLANT
GLOVE BIOGEL M SZ8.5 STRL (GLOVE) ×2 IMPLANT
GLOVE BIOGEL PI IND STRL 8 (GLOVE) ×2 IMPLANT
GLOVE BIOGEL PI INDICATOR 8 (GLOVE) ×2
GOWN STRL REUS W/ TWL LRG LVL3 (GOWN DISPOSABLE) ×1 IMPLANT
GOWN STRL REUS W/ TWL XL LVL3 (GOWN DISPOSABLE) ×3 IMPLANT
GOWN STRL REUS W/TWL 2XL LVL3 (GOWN DISPOSABLE) ×2 IMPLANT
GOWN STRL REUS W/TWL LRG LVL3 (GOWN DISPOSABLE) ×2
GOWN STRL REUS W/TWL XL LVL3 (GOWN DISPOSABLE) ×6
HANDPIECE INTERPULSE COAX TIP (DISPOSABLE) ×2
HOOD PEEL AWAY FACE SHEILD DIS (HOOD) ×4 IMPLANT
IMMOBILIZER KNEE 20 (SOFTGOODS) IMPLANT
IMMOBILIZER KNEE 22 UNIV (SOFTGOODS) ×2 IMPLANT
IMMOBILIZER KNEE 24 THIGH 36 (MISCELLANEOUS) IMPLANT
IMMOBILIZER KNEE 24 UNIV (MISCELLANEOUS)
INSERT TIB LCS RP STD 10 (Knees) ×1 IMPLANT
KIT BASIN OR (CUSTOM PROCEDURE TRAY) ×2 IMPLANT
KIT ROOM TURNOVER OR (KITS) ×2 IMPLANT
MANIFOLD NEPTUNE II (INSTRUMENTS) ×2 IMPLANT
NDL HYPO 21X1 ECLIPSE (NEEDLE) ×1 IMPLANT
NEEDLE HYPO 21X1 ECLIPSE (NEEDLE) ×2 IMPLANT
NS IRRIG 1000ML POUR BTL (IV SOLUTION) ×2 IMPLANT
PACK GENERAL/GYN (CUSTOM PROCEDURE TRAY) ×2 IMPLANT
PACK TOTAL JOINT (CUSTOM PROCEDURE TRAY) ×2 IMPLANT
PACK UNIVERSAL I (CUSTOM PROCEDURE TRAY) ×2 IMPLANT
PAD ARMBOARD 7.5X6 YLW CONV (MISCELLANEOUS) ×4 IMPLANT
PAD CAST 4YDX4 CTTN HI CHSV (CAST SUPPLIES) ×1 IMPLANT
PADDING CAST COTTON 4X4 STRL (CAST SUPPLIES) ×2
PATELLA DOME PFC 32MM (Knees) ×1 IMPLANT
SET HNDPC FAN SPRY TIP SCT (DISPOSABLE) ×1 IMPLANT
SPONGE GAUZE 4X4 12PLY STER LF (GAUZE/BANDAGES/DRESSINGS) ×1 IMPLANT
STAPLER VISISTAT 35W (STAPLE) ×2 IMPLANT
STOCKINETTE IMPERVIOUS 9X36 MD (GAUZE/BANDAGES/DRESSINGS) ×2 IMPLANT
STRIP CLOSURE SKIN 1/2X4 (GAUZE/BANDAGES/DRESSINGS) ×2 IMPLANT
SUCTION FRAZIER TIP 10 FR DISP (SUCTIONS) IMPLANT
SUT ETHILON 4 0 FS 1 (SUTURE) ×2 IMPLANT
SUT MNCRL AB 3-0 PS2 18 (SUTURE) IMPLANT
SUT VIC AB 0 CT1 27 (SUTURE) ×4
SUT VIC AB 0 CT1 27XBRD ANBCTR (SUTURE) ×2 IMPLANT
SUT VIC AB 2-0 CT1 27 (SUTURE) ×4
SUT VIC AB 2-0 CT1 TAPERPNT 27 (SUTURE) ×2 IMPLANT
SUT VLOC 180 0 24IN GS25 (SUTURE) ×2 IMPLANT
SYR 50ML LL SCALE MARK (SYRINGE) ×2 IMPLANT
TOWEL OR 17X24 6PK STRL BLUE (TOWEL DISPOSABLE) ×2 IMPLANT
TOWEL OR 17X26 10 PK STRL BLUE (TOWEL DISPOSABLE) ×2 IMPLANT
TRAY FOLEY CATH 14FR (SET/KITS/TRAYS/PACK) IMPLANT
TRAY REVISION SZ 3 (Knees) ×1 IMPLANT
WATER STERILE IRR 1000ML POUR (IV SOLUTION) ×4 IMPLANT

## 2014-08-03 NOTE — Transfer of Care (Signed)
Immediate Anesthesia Transfer of Care Note  Patient: Karen Hicks  Procedure(s) Performed: Procedure(s): LEFT TOTAL KNEE ARTHROPLASTY (Left) HARDWARE REMOVAL (Left)  Patient Location: PACU  Anesthesia Type:Spinal  Level of Consciousness: awake and alert   Airway & Oxygen Therapy: Patient Spontanous Breathing and Patient connected to nasal cannula oxygen  Post-op Assessment: Report given to PACU RN and Post -op Vital signs reviewed and stable  Post vital signs: Reviewed and stable  Complications: No apparent anesthesia complications

## 2014-08-03 NOTE — Op Note (Signed)
PREOP DIAGNOSIS: DJD LEFT KNEE POSTOP DIAGNOSIS:  same PROCEDURE: LEFT TKR and removal hardware ANESTHESIA: Spinal and block ATTENDING SURGEON: Savina Olshefski G ASSISTANT: Elodia FlorenceAndrew Nida PA  INDICATIONS FOR PROCEDURE: Karen Hicks is a 43 y.o. female who has struggled for a long time with pain due to degenerative arthritis of the left knee.  The patient has failed many conservative non-operative measures and at this point has pain which limits the ability to sleep and walk.  The patient is offered total knee replacement.  Informed operative consent was obtained after discussion of possible risks of anesthesia, infection, neurovascular injury, DVT, and death.  The importance of the post-operative rehabilitation protocol to optimize result was stressed extensively with the patient.  SUMMARY OF FINDINGS AND PROCEDURE:  Karen Hicks was taken to the operative suite where under the above anesthesia a left knee replacement was performed.  There were advanced degenerative changes and the bone quality was excellent.  We used the DePuyLCS system and placed size standard femur, 3 tibia, 32 mm all polyethylene patella, and a size 10 mm spacer.  This was preceded by removal of two staples from the medial aspect of the proximal tibia near the knee.  Elodia FlorenceAndrew Nida PA-C assisted throughout and was invaluable to the completion of the case in that he helped retract and maintain exposure while I placed the components.  He also helped close thereby minimizing OR time.  The patient was admitted for appropriate post-op care to include perioperative antibiotics and mechanical and pharmacologic measures for DVT prophylaxis.  DESCRIPTION OF PROCEDURE:  Karen Hicks was taken to the operative suite where the above anesthesia was applied.  The patient was positioned supine and prepped and draped in normal sterile fashion.  An appropriate time out was performed.  After the administration of vancomycin  pre-op antibiotic the leg was elevated and exsanguinated and a tourniquet inflated.  A standard longitudinal incision was made on the anterior knee.  Dissection was carried down to the extensor mechanism.  All appropriate anti-infective measures were used including the pre-operative antibiotic, betadine impregnated drape, and closed hooded exhaust systems for each member of the surgical team.  A medial parapatellar incision was made in the extensor mechanism and the knee cap flipped and the knee flexed.  Some residual meniscal tissues were removed along with any remaining ACL/PCL tissue.  We were able to find two staples along the medial aspect of the proximal tibia and these were removed with osteotomes sacrificing minimal bone.  A guide was placed on the tibia and a flat cut was made on it's superior surface.  An intramedullary guide was placed in the femur and was utilized to make anterior and posterior cuts creating an appropriate flexion gap.  We did not have to remove the staple in the femur.  A second intramedullary guide was placed in the femur to make a distal cut properly balancing the knee with an extension gap equal to the flexion gap.  The three bones sized to the above mentioned sizes and the appropriate guides were placed and utilized.  A trial reduction was done and the knee easily came to full extension and the patella tracked well on flexion.  The trial components were removed and all bones were cleaned with pulsatile lavage and then dried thoroughly.  Cement was mixed and was pressurized onto the bones followed by placement of the aforementioned components.  Excess cement was trimmed and pressure was held on the components until the cement had hardened.  The tourniquet  was deflated and a small amount of bleeding was controlled with cautery and pressure.  The knee was irrigated thoroughly.  The extensor mechanism was re-approximated with V-loc suture in running fashion.  The knee was flexed and the  repair was solid.  The subcutaneous tissues were re-approximated with #0 and #2-0 vicryl and the skin closed with a subcuticular stitch and steristrips.  A sterile dressing was applied.  Intraoperative fluids, EBL, and tourniquet time can be obtained from anesthesia records.  DISPOSITION:  The patient was taken to recovery room in stable condition and admitted for appropriate post-op care to include peri-operative antibiotic and DVT prophylaxis with mechanical and pharmacologic measures.  Kassidee Narciso G 08/03/2014, 3:53 PM

## 2014-08-03 NOTE — Anesthesia Procedure Notes (Signed)
Spinal Patient location during procedure: OR Start time: 08/03/2014 2:08 PM End time: 08/03/2014 2:15 PM Staffing Anesthesiologist: HODIERNE, ADAM Performed by: anesthesiologist  Preanesthetic Checklist Completed: patient identified, site marked, surgical consent, pre-op evaluation, timeout performed, IV checked, risks and benefits discussed and monitors and equipment checked Spinal Block Patient position: sitting Prep: Betadine Patient monitoring: heart rate, cardiac monitor, continuous pulse ox and blood pressure Approach: midline Location: L3-4 Injection technique: single-shot Needle Needle type: Pencan  Needle gauge: 24 G Needle length: 10 cm Assessment Sensory level: T6 Additional Notes Pt tolerated the procedure well.

## 2014-08-03 NOTE — Progress Notes (Signed)
Orthopedic Tech Progress Note Patient Details:  Karen Hicks 1971-02-15 [id number] CPM applied to LLE with appropriate settings. OHF applied to bed. CPM Left Knee CPM Left Knee: On Left Knee Flexion (Degrees): 90 Left Knee Extension (Degrees): 0   Asia R Thompson 08/03/2014, 5:03 PM

## 2014-08-03 NOTE — Interval H&P Note (Signed)
History and Physical Interval Note:  08/03/2014 12:22 PM  Karen Hicks  has presented today for surgery, with the diagnosis of LEFT KNEE PAIN  The various methods of treatment have been discussed with the patient and family. After consideration of risks, benefits and other options for treatment, the patient has consented to  Procedure(s): LEFT TOTAL KNEE ARTHROPLASTY (Left) HARDWARE REMOVAL (Left) as a surgical intervention .  The patient's history has been reviewed, patient examined, no change in status, stable for surgery.  I have reviewed the patient's chart and labs.  Questions were answered to the patient's satisfaction.     Reisa Coppola G

## 2014-08-04 ENCOUNTER — Encounter (HOSPITAL_COMMUNITY): Payer: Self-pay | Admitting: General Practice

## 2014-08-04 LAB — CBC
HCT: 33.5 % — ABNORMAL LOW (ref 36.0–46.0)
Hemoglobin: 11.3 g/dL — ABNORMAL LOW (ref 12.0–15.0)
MCH: 28.6 pg (ref 26.0–34.0)
MCHC: 33.7 g/dL (ref 30.0–36.0)
MCV: 84.8 fL (ref 78.0–100.0)
Platelets: 254 10*3/uL (ref 150–400)
RBC: 3.95 MIL/uL (ref 3.87–5.11)
RDW: 13.3 % (ref 11.5–15.5)
WBC: 11.8 10*3/uL — ABNORMAL HIGH (ref 4.0–10.5)

## 2014-08-04 LAB — BASIC METABOLIC PANEL
Anion gap: 16 — ABNORMAL HIGH (ref 5–15)
BUN: 6 mg/dL (ref 6–23)
CO2: 19 mEq/L (ref 19–32)
Calcium: 8.6 mg/dL (ref 8.4–10.5)
Chloride: 102 mEq/L (ref 96–112)
Creatinine, Ser: 0.66 mg/dL (ref 0.50–1.10)
GFR calc Af Amer: >90 mL/min (ref >90)
GFR calc non Af Amer: >90 mL/min (ref >90)
Glucose, Bld: 140 mg/dL — ABNORMAL HIGH (ref 70–99)
Potassium: 3.9 mEq/L (ref 3.7–5.3)
Sodium: 137 mEq/L (ref 137–147)

## 2014-08-04 MED ORDER — DIPHENHYDRAMINE HCL 25 MG PO CAPS
50.0000 mg | ORAL_CAPSULE | ORAL | Status: DC | PRN
  Administered 2014-08-04 – 2014-08-05 (×2): 50 mg via ORAL
  Filled 2014-08-04 (×2): qty 2

## 2014-08-04 MED ORDER — HYDROCODONE-ACETAMINOPHEN 10-325 MG PO TABS
1.0000 | ORAL_TABLET | ORAL | Status: DC | PRN
  Administered 2014-08-04 – 2014-08-05 (×5): 2 via ORAL
  Filled 2014-08-04 (×5): qty 2

## 2014-08-04 NOTE — Plan of Care (Signed)
Problem: Phase I Progression Outcomes Goal: CMS/Neurovascular status WDL Outcome: Completed/Met Date Met:  08/04/14 Goal: Pain controlled with appropriate interventions Outcome: Progressing Goal: Dangle or out of bed evening of surgery Outcome: Completed/Met Date Met:  08/04/14 Up to [facility] Goal: Hemodynamically stable Outcome: Completed/Met Date Met:  08/04/14

## 2014-08-04 NOTE — Accreditation Note (Signed)
CARE MANAGEMENT NOTE 08/04/2014  Patient:  Karen Hicks,Karen Hicks   Account Number:  [id number]  Date Initiated:  08/04/2014  Documentation initiated by:  Vance PeperBRADY,Kristi Norment  Subjective/Objective Assessment:   43 yr old female admitted with DJD of the left knee. Patient had a left total knee arthroplasty.     Action/Plan:   Case manager spoke with patient concerning home health and DME needs. Patient was preoperatively setup with [facility], no changes.   Anticipated DC Date:  08/04/2014   Anticipated DC Plan:  HOME W HOME HEALTH SERVICES      DC Planning Services  CM consult      [facility]  DURABLE MEDICAL EQUIPMENT   Choice offered to / List presented to:  C-1 Patient   DME arranged  CPM  [facility] ROLL[facility]  3-N-1      DME agency  [employer]     HH arranged  HH-2 PT      HH agency  [facility]   Status of service:  Completed, signed off Medicare Important Message given?   (If response is "NO", the following Medicare IM given date fields will be blank) Date Medicare IM given:   Medicare IM given by:   Date Additional Medicare IM given:   Additional Medicare IM given by:    Discharge Disposition:  HOME W HOME HEALTH SERVICES  Per UR Regulation:  Reviewed for med. necessity/level of care/duration of stay

## 2014-08-04 NOTE — Evaluation (Signed)
Physical Therapy Evaluation Patient Details Name: Karen Hicks MRN: [id number] DOB: 1971/04/28 Today's Date: 08/04/2014   History of Present Illness  43 yo female s/p L TKA with hardware removal PMH: arthritis, Gerd, sleep apnea, Obese, migraine, PONV, anemia, multiple knee surg, Rt wrist surg  Clinical Impression  Pt admitted with weakness bilateral LE's from multiple surgeries. Pt currently with functional limitations due to the deficits listed below (see PT Problem List). Pt ambulated 100' with RW and min-guard A.  Pt will benefit from skilled PT to increase their independence and safety with mobility to allow discharge to the venue listed below. PT will continue to follow.       Follow Up Recommendations Home health PT;Supervision - Intermittent    Equipment Recommendations  None recommended by PT    Recommendations for Other Services       Precautions / Restrictions Precautions Precautions: Knee Precaution Comments: discussed proper positioning Required Braces or Orthoses: Knee Immobilizer - Left Restrictions Weight Bearing Restrictions: Yes LLE Weight Bearing: Weight bearing as tolerated      Mobility  Bed Mobility Overal bed mobility: Needs Assistance Bed Mobility: Supine to Sit;Sit to Supine     Supine to sit: Min assist Sit to supine: Min assist   General bed mobility comments: min A to support LLE due to pain, was able to do this earlier in day without assist  Transfers Overall transfer level: Needs assistance Equipment used: Rolling walker (2 wheeled) Transfers: Sit to/from Stand Sit to Stand: Supervision         General transfer comment: pt little dizzy, close supervision to monitor, improved with time up  Ambulation/Gait Ambulation/Gait assistance: Min guard Ambulation Distance (Feet): 100 Feet Assistive device: Rolling walker (2 wheeled) Gait Pattern/deviations: Step-through pattern;Decreased weight shift to left Gait velocity:  decreased   General Gait Details: vc's for sequencing  Stairs            Wheelchair Mobility    Modified Rankin (Stroke Patients Only)       Balance Overall balance assessment: No apparent balance deficits (not formally assessed)                                           Pertinent Vitals/Pain Pain Assessment: 0-10 Pain Score: 7  Pain Location: left knee Pain Descriptors / Indicators: Aching Pain Intervention(s): Monitored during session    Home Living Family/patient expects to be discharged to:: Private residence Living Arrangements: Children Available Help at Discharge: Family;Available PRN/intermittently Type of Home: House Home Access: Level entry     Home Layout: One level Home Equipment: Walker - 2 wheels;Bedside commode Additional Comments: pt with significant hx of knee surgeries. Pt has multiple aunts that are helping with her 43 yo son and are planning to stay with her after surgery    Prior Function Level of Independence: Independent               Hand Dominance   Dominant Hand: Right    Extremity/Trunk Assessment   Upper Extremity Assessment: Overall WFL for tasks assessed           Lower Extremity Assessment: LLE deficits/detail;RLE deficits/detail RLE Deficits / Details: grossly 4/5 throughout, sufficient for sit to stand but she reports she will need to have TKA this side as well LLE Deficits / Details: ankle and hip WFL, knee ext 2-/5  Cervical / Trunk Assessment: Normal  Communication   Communication: No difficulties  Cognition Arousal/Alertness: Awake/alert Behavior During Therapy: WFL for tasks assessed/performed Overall Cognitive Status: Within Functional Limits for tasks assessed                      General Comments      Exercises Total Joint Exercises Ankle Circles/Pumps: AROM;5 reps;10 reps Quad Sets: AROM;Left;10 reps      Assessment/Plan    PT Assessment Patient needs continued  PT services  PT Diagnosis Difficulty walking;Abnormality of gait;Acute pain   PT Problem List Decreased strength;Decreased range of motion;Decreased activity tolerance;Decreased balance;Decreased mobility;Decreased knowledge of use of DME;Decreased knowledge of precautions;Pain  PT Treatment Interventions DME instruction;Gait training;Functional mobility training;Therapeutic activities;Therapeutic exercise;Neuromuscular re-education;Patient/family education   PT Goals (Current goals can be found in the Care Plan section) Acute Rehab PT Goals Patient Stated Goal: return home and work PT Goal Formulation: With patient Time For Goal Achievement: 08/11/14 Potential to Achieve Goals: Good    Frequency 7X/week   Barriers to discharge        Co-evaluation               End of Session Equipment Utilized During Treatment: Gait belt;Left knee immobilizer Activity Tolerance: Patient tolerated treatment well Patient left: in CPM;with call bell/phone within reach;with family/visitor present Nurse Communication: Mobility status         Time: [id number] PT Time Calculation (min) (ACUTE ONLY): 32 min   Charges:   PT Evaluation $Initial PT Evaluation Tier I: 1 Procedure PT Treatments $Gait Training: 8-22 mins $Therapeutic Activity: 8-22 mins   PT G Codes:        Lyanne CoVictoria [name], PT  Acute Rehab Services  [phone]   Lyanne CoManess, [name] 08/04/2014, 12:40 PM

## 2014-08-04 NOTE — Progress Notes (Signed)
Physical Therapy Treatment Patient Details Name: Karen Hicks MRN: [id number] DOB: 1971/02/04 Today's Date: 08/04/2014    History of Present Illness 43 yo female s/p L TKA with hardware removal PMH: arthritis, Gerd, sleep apnea, Obese, migraine, PONV, anemia, multiple knee surg, Rt wrist surg    PT Comments    Pt progressing with mobility however, experiencing increased pain this afternoon posterior knee. Supervision with ambulation x125' with RW. PT will continue to follow.   Follow Up Recommendations  Home health PT;Supervision - Intermittent     Equipment Recommendations  None recommended by PT    Recommendations for Other Services       Precautions / Restrictions Precautions Precautions: Knee Precaution Comments: discussed proper positioning Required Braces or Orthoses: Knee Immobilizer - Left Restrictions Weight Bearing Restrictions: Yes LLE Weight Bearing: Weight bearing as tolerated    Mobility  Bed Mobility Overal bed mobility: Modified Independent Bed Mobility: Supine to Sit     Supine to sit: Modified independent (Device/Increase time) Sit to supine: Min assist   General bed mobility comments: pt able to use UE's to get leg off bed and to edge independently  Transfers Overall transfer level: Needs assistance Equipment used: Rolling walker (2 wheeled) Transfers: Sit to/from Stand Sit to Stand: Supervision         General transfer comment: pt slightly unsteady due to pain but took her time to settle before beginning to move, supervision given  Ambulation/Gait Ambulation/Gait assistance: Supervision Ambulation Distance (Feet): 120 Feet Assistive device: Rolling walker (2 wheeled) Gait Pattern/deviations: Step-through pattern;Decreased weight shift to left Gait velocity: decreased   General Gait Details: able to get heel to floor but flat foot landing. No buckling noted within [employer]   Stairs            Wheelchair Mobility     Modified Rankin (Stroke Patients Only)       Balance Overall balance assessment: No apparent balance deficits (not formally assessed)                                  Cognition Arousal/Alertness: Awake/alert Behavior During Therapy: WFL for tasks assessed/performed Overall Cognitive Status: Within Functional Limits for tasks assessed                      Exercises Total Joint Exercises Ankle Circles/Pumps: AROM;Both;10 reps Quad Sets: AROM;Both;10 reps Long Arc Quad: AAROM;Left;10 reps;Seated    General Comments General comments (skin integrity, edema, etc.): pt with increased pain posterior knee, had been supine with knee in extension on foam x45 mins before session      Pertinent Vitals/Pain Pain Assessment: 0-10 Pain Score: 9  Pain Location: left knee Pain Descriptors / Indicators: Aching Pain Intervention(s): Limited activity within patient's tolerance    Home Living Family/patient expects to be discharged to:: Private residence Living Arrangements: Children Available Help at Discharge: Family;Available PRN/intermittently Type of Home: House Home Access: Level entry   Home Layout: One level Home Equipment: Walker - 2 wheels;Bedside commode Additional Comments: pt with significant hx of knee surgeries. Pt has multiple aunts that are helping with her 43 yo son and are planning to stay with her after surgery    Prior Function Level of Independence: Independent          PT Goals (current goals can now be found in the care plan section) Acute Rehab PT Goals Patient Stated Goal: return home and work  PT Goal Formulation: With patient Time For Goal Achievement: 08/11/14 Potential to Achieve Goals: Good Progress towards PT goals: Progressing toward goals    Frequency  7X/week    PT Plan Current plan remains appropriate    Co-evaluation             End of Session Equipment Utilized During Treatment: Gait belt;Left knee  immobilizer Activity Tolerance: Patient limited by pain Patient left: in chair;with call bell/phone within reach     Time: 1518-1550 PT Time Calculation (min) (ACUTE ONLY): 32 min  Charges:  $Gait Training: 23-37 mins $Therapeutic Activity: 8-22 mins                    G Codes:     Lyanne CoVictoria Eliyohu Class, PT  Acute Rehab Services  [phone]  Lyanne CoManess, Neven Fina 08/04/2014, 3:58 PM

## 2014-08-04 NOTE — Progress Notes (Signed)
Occupational Therapy Evaluation and Discharge Patient Details Name: [name] MRN: [id number] DOB: 1970-12-27 Today's Date: 08/04/2014    History of Present Illness 43 yo female s/p L TKA with hardware removal PMH: arthritis, Gerd, sleep apnea, Obese, migraine, PONV, anemia, multiple knee surg, Rt wrist surg   Clinical Impression   PTA pt lived at home with her son and was independent with ADLs. Pt has extensive hx of knee surgeries and is familiar with compensatory techniques and fall prevention strategies. [facility] and will likely progress quickly. No further acute OT needs.     Follow Up Recommendations  No OT follow up;Supervision - Intermittent    Equipment Recommendations  Other (comment) (AE (reacher))    Recommendations for Other Services       Precautions / Restrictions Precautions Precautions: Knee Precaution Comments: Educated pt on knee precautions.  Required Braces or Orthoses: Knee Immobilizer - Left Restrictions Weight Bearing Restrictions: Yes LLE Weight Bearing: Weight bearing as tolerated      Mobility Bed Mobility Overal bed mobility: Modified Independent             General bed mobility comments: Pt able to manage LLE to EOB without difficulty.   Transfers Overall transfer level: Needs assistance Equipment used: Rolling walker (2 wheeled) Transfers: Sit to/from Stand Sit to Stand: Supervision         General transfer comment: No LOB, good hand placement and technique.          ADL Overall ADL's : Needs assistance/impaired Eating/Feeding: Independent;Sitting   Grooming: Min guard;Standing Grooming Details (indicate cue type and reason): pt limiting WB on Left LE due to pain, however able to stand for grooming Upper Body Bathing: Set up;Sitting   Lower Body Bathing: Set up;Sit to/from stand;Supervison/ safety   Upper Body Dressing : Set up;Sitting   Lower Body Dressing: Set  up;Supervision/safety;Sit to/from stand Lower Body Dressing Details (indicate cue type and reason): pt able to reach L foot in long sitting in bed with KI on and demonstrates very good flexibility.  Toilet Transfer: Min guard;Ambulation;RW Toilet Transfer Details (indicate cue type and reason): sit<>stand from bed         Functional mobility during ADLs: Min guard;Rolling walker General ADL Comments: Pt with hx of knee surgeries and very familiar with compensatory techniques and fall prevention. Pt has method of getting into/out of bathtub that she has used after several surgeries.      Vision  Pt reports no change from baseline.                    Perception Perception Perception Tested?: No   Praxis Praxis Praxis tested?: Within functional limits    Pertinent Vitals/Pain Pain Assessment: 0-10 Pain Score: 4  Pain Location: L knee Pain Descriptors / Indicators: Aching Pain Intervention(s): Monitored during session;Repositioned     Hand Dominance Right   Extremity/Trunk Assessment Upper Extremity Assessment Upper Extremity Assessment: Overall WFL for tasks assessed   Lower Extremity Assessment Lower Extremity Assessment: Defer to PT evaluation   Cervical / Trunk Assessment Cervical / Trunk Assessment: Normal   Communication Communication Communication: No difficulties   Cognition Arousal/Alertness: Awake/alert Behavior During Therapy: WFL for tasks assessed/performed Overall Cognitive Status: Within Functional Limits for tasks assessed                                Home Living Family/patient expects to  be discharged to:: Private residence Living Arrangements: Children (43 year old son (7th grader)) Available Help at Discharge: Family;Available PRN/intermittently (pt has 4 sisters who live nearby) Type of Home: House Home Access: Stairs to enter     Home Layout: One level     Bathroom Shower/Tub: Chief Strategy OfficerTub/shower unit   Bathroom Toilet:  Standard     Home Equipment: Environmental consultantWalker - 2 wheels;Bedside commode   Additional Comments: pt with significant hx of knee surgeries      Prior Functioning/Environment Level of Independence: Independent             OT Diagnosis: Generalized weakness;Acute pain    End of Session Equipment Utilized During Treatment: Gait belt;Rolling walker;Left knee immobilizer CPM Left Knee CPM Left Knee: Off Nurse Communication: Mobility status  Activity Tolerance: Patient tolerated treatment well Patient left: in bed;with call bell/phone within reach   Time: 0942-1009 OT Time Calculation (min): 27 min Charges:  OT General Charges $OT Visit: 1 Procedure OT Evaluation $Initial OT Evaluation Tier I: 1 Procedure OT Treatments $Self Care/Home Management : 8-22 mins  Rae LipsMiller, Jereld Presti M 08/04/2014, 10:22 AM  Carney LivingLeeAnn Marie Malvina Schadler, OTR/L Occupational Therapist [phone] (pager)

## 2014-08-04 NOTE — Plan of Care (Signed)
Problem: Phase I Progression Outcomes Goal: Initial discharge plan identified Outcome: Completed/Met Date Met:  08/04/14  Problem: Phase II Progression Outcomes Goal: Tolerating diet Outcome: Completed/Met Date Met:  08/04/14 Goal: Discharge plan established Outcome: Completed/Met Date Met:  08/04/14 Goal: Other Phase II Outcomes/Goals Outcome: Not Applicable Date Met:  50/56/97

## 2014-08-04 NOTE — Progress Notes (Signed)
Utilization review completed.  

## 2014-08-04 NOTE — Progress Notes (Signed)
Subjective: 1 Day Post-Op Procedure(s) (LRB): LEFT TOTAL KNEE ARTHROPLASTY (Left) HARDWARE REMOVAL (Left)  Activity level:  wbat Diet tolerance:  ok Voiding:  ok Patient reports pain as mild.    Objective: Vital signs in last 24 hours: Temp:  [97.5 F (36.4 C)-99 F (37.2 C)] 99 F (37.2 C) (12/02 0544) Pulse Rate:  [55-73] 73 (12/02 0544) Resp:  [11-20] 16 (12/02 0544) BP: (118-143)/(55-87) 118/55 mmHg (12/02 0544) SpO2:  [97 %-100 %] 97 % (12/02 0544) Weight:  [78.472 kg (173 lb)] 78.472 kg (173 lb) (12/01 1207)  Labs: No results for input(s): HGB in the last 72 hours. No results for input(s): WBC, RBC, HCT, PLT in the last 72 hours. No results for input(s): NA, K, CL, CO2, BUN, CREATININE, GLUCOSE, CALCIUM in the last 72 hours. No results for input(s): LABPT, INR in the last 72 hours.  Physical Exam:  Neurologically intact ABD soft Neurovascular intact Sensation intact distally Intact pulses distally Dorsiflexion/Plantar flexion intact Incision: dressing C/D/I and scant drainage No cellulitis present Compartment soft  Assessment/Plan:  1 Day Post-Op Procedure(s) (LRB): LEFT TOTAL KNEE ARTHROPLASTY (Left) HARDWARE REMOVAL (Left) Advance diet Up with therapy D/C IV fluids Plan for discharge tomorrow Discharge home with home health if doing well and cleared by PT. Continue on ASA 325mg  BID x 2 weeks post op.  Follow up in office 2 weeks post op.    Gardenia Witter, Ginger OrganNDREW PAUL 08/04/2014, 7:34 AM

## 2014-08-05 LAB — CBC
HCT: 32.7 % — ABNORMAL LOW (ref 36.0–46.0)
Hemoglobin: 10.8 g/dL — ABNORMAL LOW (ref 12.0–15.0)
MCH: 27.6 pg (ref 26.0–34.0)
MCHC: 33 g/dL (ref 30.0–36.0)
MCV: 83.4 fL (ref 78.0–100.0)
Platelets: 277 10*3/uL (ref 150–400)
RBC: 3.92 MIL/uL (ref 3.87–5.11)
RDW: 13.5 % (ref 11.5–15.5)
WBC: 10.5 10*3/uL (ref 4.0–10.5)

## 2014-08-05 MED ORDER — METHOCARBAMOL 500 MG PO TABS: 500.0000 mg | ORAL_TABLET | Freq: Four times a day (QID) | ORAL | Status: DC | PRN

## 2014-08-05 MED ORDER — ASPIRIN 325 MG PO TBEC: 325.0000 mg | DELAYED_RELEASE_TABLET | Freq: Two times a day (BID) | ORAL | Status: DC

## 2014-08-05 MED ORDER — HYDROCODONE-ACETAMINOPHEN 10-325 MG PO TABS: 1.0000 | ORAL_TABLET | ORAL | Status: DC | PRN

## 2014-08-05 NOTE — Plan of Care (Signed)
Problem: Phase I Progression Outcomes Goal: Pain controlled with appropriate interventions Outcome: Completed/Met Date Met:  08/05/14 Goal: Other Phase I Outcomes/Goals Outcome: Completed/Met Date Met:  08/05/14  Problem: Phase II Progression Outcomes Goal: Ambulates Outcome: Completed/Met Date Met:  08/05/14  Problem: Phase III Progression Outcomes Goal: Ambulates Outcome: Completed/Met Date Met:  08/05/14 Goal: Incision clean - minimal/no drainage Outcome: Completed/Met Date Met:  08/05/14 Goal: Discharge plan remains appropriate-arrangements made Outcome: Completed/Met Date Met:  08/05/14

## 2014-08-05 NOTE — Progress Notes (Signed)
Physical Therapy Treatment Patient Details Name: Karen Hicks MRN: [id number] DOB: 1971/02/18 Today's Date: 08/05/2014    History of Present Illness 43 yo female s/p L TKA with hardware removal PMH: arthritis, Gerd, sleep apnea, Obese, migraine, PONV, anemia, multiple knee surg, Rt wrist surg    PT Comments    Pt is progressing well with mobility and gait distance, however, her speed continues to be very slow.  She is progressing nicely with her exercises and will need to initiate stair training this PM for anticipation of d/c soon.  PT will continue to follow acutely.   Follow Up Recommendations  Home health PT;Supervision - Intermittent     Equipment Recommendations  None recommended by PT    Recommendations for Other Services   NA     Precautions / Restrictions Precautions Precautions: Knee Precaution Comments: reviewed WBAT status, KI use, and no pillow under operated knee Required Braces or Orthoses: Knee Immobilizer - Left Knee Immobilizer - Left: On when out of bed or walking Restrictions Weight Bearing Restrictions: Yes LLE Weight Bearing: Weight bearing as tolerated    Mobility           Transfers Overall transfer level: Needs assistance Equipment used: Rolling walker (2 wheeled) Transfers: Sit to/from Stand Sit to Stand: Supervision         General transfer comment: supervision for safety.   Ambulation/Gait Ambulation/Gait assistance: Supervision Ambulation Distance (Feet): 100 Feet Assistive device: Rolling walker (2 wheeled) Gait Pattern/deviations: Step-to pattern;Antalgic Gait velocity: 0.20 ft/sec Gait velocity interpretation: <1.8 ft/sec, indicative of risk for recurrent falls General Gait Details: Pt with very slow gait pattern, but generally steady.           Balance Overall balance assessment: No apparent balance deficits (not formally assessed)                                  Cognition Arousal/Alertness:  Awake/alert Behavior During Therapy: WFL for tasks assessed/performed Overall Cognitive Status: Within Functional Limits for tasks assessed                      Exercises Total Joint Exercises Ankle Circles/Pumps: AROM;Both;10 reps Quad Sets: AROM;Both;10 reps Towel Squeeze: AROM;Both;10 reps;Supine Short Arc Quad: AAROM;Left;10 reps;Supine Heel Slides: AAROM;Left;10 reps;Supine Hip ABduction/ADduction: AAROM;Left;10 reps;Supine Straight Leg Raises: AAROM;Left;10 reps;Supine        Pertinent Vitals/Pain Pain Assessment: 0-10 Pain Score: 8  Pain Location: left knee Pain Descriptors / Indicators: Aching;Burning Pain Intervention(s): Limited activity within patient's tolerance;Monitored during session;Repositioned           PT Goals (current goals can now be found in the care plan section) Acute Rehab PT Goals Patient Stated Goal: return home and work Progress towards PT goals: Progressing toward goals    Frequency  7X/week    PT Plan Current plan remains appropriate       End of Session Equipment Utilized During Treatment: Gait belt;Left knee immobilizer Activity Tolerance: Patient limited by pain Patient left: in chair;with call bell/phone within reach     Time: 1015-1059 PT Time Calculation (min) (ACUTE ONLY): 44 min  Charges:  $Gait Training: 23-37 mins $Therapeutic Exercise: 8-22 mins                     Shelma Eiben B. Sallye Lunz, PT, DPT [phone]   08/05/2014, 5:56 PM

## 2014-08-05 NOTE — Progress Notes (Signed)
Physical Therapy Treatment Patient Details Name: Karen Hicks MRN: [id number] DOB: October 24, 1970 Today's Date: 08/05/2014    History of Present Illness 43 yo female s/p L TKA with hardware removal PMH: arthritis, Gerd, sleep apnea, Obese, migraine, PONV, anemia, multiple knee surg, Rt wrist surg    PT Comments    Pt able to demonstrate safety on the stairs min assist to stabilize RW with reverse technique.  Exercise review completed.  Pt is ready for d/c home with sister's assist and HHPT f/u.  Follow Up Recommendations  Home health PT;Supervision - Intermittent     Equipment Recommendations  None recommended by PT    Recommendations for Other Services   NA     Precautions / Restrictions Precautions Precautions: Knee Precaution Comments: reviewed WBAT status, KI use, and no pillow under operated knee Required Braces or Orthoses: Knee Immobilizer - Left Knee Immobilizer - Left: On when out of bed or walking Restrictions Weight Bearing Restrictions: Yes LLE Weight Bearing: Weight bearing as tolerated    Mobility  Bed Mobility Overal bed mobility: Modified Independent Bed Mobility: Supine to Sit     Supine to sit: Modified independent (Device/Increase time)     General bed mobility comments: Pt using hands to progress left leg OOB  Transfers Overall transfer level: Needs assistance Equipment used: Rolling walker (2 wheeled) Transfers: Sit to/from Stand Sit to Stand: Modified independent (Device/Increase time)         General transfer comment: Pt using hands to control transitions, but doing so safely and easily from both bed and commode in bathroom.   Ambulation/Gait Ambulation/Gait assistance: Supervision Ambulation Distance (Feet): 150 Feet Assistive device: Rolling walker (2 wheeled) Gait Pattern/deviations: Step-to pattern;Antalgic Gait velocity: 0.50 ft/sec Gait velocity interpretation: <1.8 ft/sec, indicative of risk for recurrent falls General  Gait Details: Pt's gait speed is improving, but still slow enough to put her into the fall risk category.    Stairs Stairs: Yes Stairs assistance: Mod assist (min/mod) Stair Management: One rail Left;Step to pattern;Forwards;Backwards;With walker (one rail and HHA, RW backwards both practiced. ) Number of Stairs: 3 General stair comments: Mod assist going forward with one railing and hand held assist, min assist to stabilize RW only to do reverse technique.  Verbal cues for correct LE sequencing.       Balance Overall balance assessment: No apparent balance deficits (not formally assessed)                                  Cognition Arousal/Alertness: Awake/alert Behavior During Therapy: WFL for tasks assessed/performed Overall Cognitive Status: Within Functional Limits for tasks assessed                      Exercises  Long Arc Quad: AROM;AAROM;Left;10 reps;Seated Knee Flexion: AAROM;AROM;Left;10 reps;Seated        Pertinent Vitals/Pain Pain Assessment: 0-10 Pain Score: 5  Pain Location: left knee Pain Descriptors / Indicators: Aching;Burning Pain Intervention(s): Limited activity within patient's tolerance;Monitored during session;Repositioned           PT Goals (current goals can now be found in the care plan section) Acute Rehab PT Goals Patient Stated Goal: return home and work Progress towards PT goals: Progressing toward goals    Frequency  7X/week    PT Plan Current plan remains appropriate       End of Session Equipment Utilized During Treatment: Gait belt;Left knee immobilizer Activity Tolerance:  Patient limited by pain Patient left: in chair;with call bell/phone within reach     Time: 1459-1540 PT Time Calculation (min) (ACUTE ONLY): 41 min  Charges:  $Gait Training: 23-37 mins $Therapeutic Exercise: 8-22 mins                      Auston Halfmann B. Chrissy Ealey, PT, DPT [phone]   08/05/2014, 6:04 PM

## 2014-08-05 NOTE — Discharge Summary (Signed)
Patient ID: Karen Hicks MRN: [id number] DOB/AGE: April 01, 1971 43 y.o.  Admit date: 08/03/2014 Discharge date: 08/05/2014  Admission Diagnoses:  Principal Problem:   Primary osteoarthritis of left knee Active Problems:   Primary osteoarthritis of knee   Discharge Diagnoses:  Same  Past Medical History  Diagnosis Date  . Allergy   . GERD (gastroesophageal reflux disease)   . Lactose intolerance   . Migraine   . Arthritis   . Complication of anesthesia     takes her alittle longer to wake up  . PONV (postoperative nausea and vomiting)   . PE (pulmonary embolism)     in 1994 meniscus repair.  Saw Dr. Susann GivensLaLonde  . Anemia     when she was young  . Sleep apnea     2007.  Uses the mask & machine when she needs it.  Dr. Susann GivensLalonde is aware    Surgeries: Procedure(s): LEFT TOTAL KNEE ARTHROPLASTY HARDWARE REMOVAL on 08/03/2014   Consultants:    Discharged Condition: Improved  Hospital Course: Karen Hicks is an 43 y.o. female who was admitted 08/03/2014 for operative treatment ofPrimary osteoarthritis of left knee. Patient has severe unremitting pain that affects sleep, daily activities, and work/hobbies. After pre-op clearance the patient was taken to the operating room on 08/03/2014 and underwent  Procedure(s): LEFT TOTAL KNEE ARTHROPLASTY HARDWARE REMOVAL.    Patient was given perioperative antibiotics: Anti-infectives    Start     Dose/Rate Route Frequency Ordered Stop   08/04/14 0230  vancomycin (VANCOCIN) IVPB 1000 mg/200 mL premix     1,000 mg200 mL/hr over 60 Minutes Intravenous Every 12 hours 08/03/14 1752 08/04/14 0242   08/03/14 0600  vancomycin (VANCOCIN) IVPB 1000 mg/200 mL premix     1,000 mg200 mL/hr over 60 Minutes Intravenous On call to O.R. 08/02/14 1404 08/03/14 1410       Patient was given sequential compression devices, early ambulation, and chemoprophylaxis to prevent DVT.  Patient benefited maximally from hospital stay and there were  no complications.    Recent vital signs: Patient Vitals for the past 24 hrs:  BP Temp Temp src Pulse Resp SpO2  08/05/14 0550 137/79 mmHg 98.8 F (37.1 C) Oral 89 18 99 %  08/04/14 2051 (!) 162/51 mmHg 99.2 F (37.3 C) Oral 63 18 100 %     Recent laboratory studies:  Recent Labs  08/04/14 0648 08/05/14 0306  WBC 11.8* 10.5  HGB 11.3* 10.8*  HCT 33.5* 32.7*  PLT 254 277  NA 137  --   K 3.9  --   CL 102  --   CO2 19  --   BUN 6  --   CREATININE 0.66  --   GLUCOSE 140*  --   CALCIUM 8.6  --      Discharge Medications:     Medication List    TAKE these medications        aspirin 325 MG EC tablet  Take 1 tablet (325 mg total) by mouth 2 (two) times daily after a meal.     baclofen 10 MG tablet  Commonly known as:  LIORESAL  Take 1 tablet (10 mg total) by mouth 3 (three) times daily.     chlorhexidine 0.12 % solution  Commonly known as:  PERIDEX  Use as directed 15 mLs in the mouth or throat 2 (two) times daily. Use after brushing teeth     clindamycin 300 MG capsule  Commonly known as:  CLEOCIN  Take 300 mg by mouth  3 (three) times daily.     diphenhydrAMINE 25 mg capsule  Commonly known as:  BENADRYL  Take 25 mg by mouth daily as needed for allergies.     esomeprazole 20 MG capsule  Commonly known as:  NEXIUM  Take 1 capsule (20 mg total) by mouth daily before breakfast.     HYDROcodone-acetaminophen 10-325 MG per tablet  Commonly known as:  NORCO  Take 1-2 tablets by mouth every 4 (four) hours as needed for moderate pain or severe pain.     loratadine 10 MG tablet  Commonly known as:  CLARITIN  Take 10 mg by mouth daily as needed for allergies.     methocarbamol 500 MG tablet  Commonly known as:  ROBAXIN  Take 1 tablet (500 mg total) by mouth every 6 (six) hours as needed for muscle spasms.     NEOSPORIN ECZEMA ESSENTIALS 1 % Crea  Generic drug:  Colloidal Oatmeal  Apply 1 application topically daily as needed (for eczema).     topiramate 25 MG  tablet  Commonly known as:  TOPAMAX  Take 1 tablet (25 mg total) by mouth 3 (three) times daily.     TYLENOL COLD MULTI-SYMPTOM 5-10-200-325 MG Tabs  Generic drug:  Phenylephrine-DM-GG-APAP  Take 2 tablets by mouth daily as needed (for cold/flu symptoms).        Diagnostic Studies: Dg Chest 2 View  07/22/2014   CLINICAL DATA:  Preop chest x-ray prior to left knee replacement  EXAM: CHEST  2 VIEW  COMPARISON:  CT chest 12/02/2007  FINDINGS: The heart size and mediastinal contours are within normal limits. Both lungs are clear. The visualized skeletal structures are unremarkable.  IMPRESSION: No active cardiopulmonary disease.   Electronically Signed   By: Elige KoHetal  Patel   On: 07/22/2014 18:30    Disposition: 01-Home or Self Care      Discharge Instructions    Call MD / Call 911    Complete by:  As directed   If you experience chest pain or shortness of breath, CALL 911 and be transported to the hospital emergency room.  If you develope a fever above 101 F, pus (white drainage) or increased drainage or redness at the wound, or calf pain, call your surgeon's office.     Constipation Prevention    Complete by:  As directed   Drink plenty of fluids.  Prune juice may be helpful.  You may use a stool softener, such as Colace (over the counter) 100 mg twice a day.  Use MiraLax (over the counter) for constipation as needed.     Diet - low sodium heart healthy    Complete by:  As directed      Increase activity slowly as tolerated    Complete by:  As directed            Follow-up Information    Follow up with Velna OchsALLDORF,PETER G, MD. Call in 2 weeks.   Specialty:  Orthopedic Surgery   Contact information:   [address] [place] [id number] [phone]        Signed: Drema HalonIDA, Casmira Cramer PAUL 08/05/2014, 1:31 PM

## 2014-08-05 NOTE — Anesthesia Postprocedure Evaluation (Signed)
  Anesthesia Post-op Note  Patient: Karen Hicks  Procedure(s) Performed: Procedure(s): LEFT TOTAL KNEE ARTHROPLASTY (Left) HARDWARE REMOVAL (Left)  Patient Location: PACU  Anesthesia Type:Spinal  Level of Consciousness: awake, alert  and oriented  Airway and Oxygen Therapy: Patient Spontanous Breathing and Patient connected to nasal cannula oxygen  Post-op Pain: none  Post-op Assessment: Post-op Vital signs reviewed, Patient's Cardiovascular Status Stable and Respiratory Function Stable  Post-op Vital Signs: Reviewed and stable  Last Vitals:  Filed Vitals:   08/05/14 0550  BP: 137/79  Pulse: 89  Temp: 37.1 C  Resp: 18    Complications: No apparent anesthesia complications

## 2014-08-05 NOTE — Plan of Care (Signed)
Problem: Phase III Progression Outcomes Goal: Pain controlled on oral analgesia Outcome: Completed/Met Date Met:  08/05/14

## 2014-08-12 ENCOUNTER — Telehealth: Payer: Self-pay | Admitting: Family Medicine

## 2014-08-12 NOTE — Telephone Encounter (Signed)
Pt just had knee surgery, said ortho has her on aspirin.  She has concerns because of previous PE and wanted to find out your opinion since you are her primary care doctor and she wants to know if she should be on something else as far as blood thinner?  Please advise

## 2014-08-25 NOTE — Telephone Encounter (Signed)
Pt notified of Dr. Lalonde recommendations 

## 2014-08-25 NOTE — Telephone Encounter (Signed)
Left message for pt to call back  °

## 2014-08-25 NOTE — Telephone Encounter (Signed)
Aspirin is fine and she needs to move as much as possible. When she is sitting she needs to keep her leg elevated

## 2014-12-08 ENCOUNTER — Other Ambulatory Visit: Payer: Self-pay | Admitting: Family Medicine

## 2014-12-14 ENCOUNTER — Encounter: Payer: Self-pay | Admitting: Family Medicine

## 2014-12-14 ENCOUNTER — Ambulatory Visit (INDEPENDENT_AMBULATORY_CARE_PROVIDER_SITE_OTHER): Payer: BC Managed Care – PPO | Admitting: Family Medicine

## 2014-12-14 VITALS — BP 120/80 | HR 86 | Wt 182.0 lb

## 2014-12-14 DIAGNOSIS — N951 Menopausal and female climacteric states: Secondary | ICD-10-CM

## 2014-12-14 DIAGNOSIS — Z96652 Presence of left artificial knee joint: Secondary | ICD-10-CM | POA: Diagnosis not present

## 2014-12-14 DIAGNOSIS — K219 Gastro-esophageal reflux disease without esophagitis: Secondary | ICD-10-CM | POA: Diagnosis not present

## 2014-12-14 DIAGNOSIS — R232 Flushing: Secondary | ICD-10-CM

## 2014-12-14 DIAGNOSIS — G43009 Migraine without aura, not intractable, without status migrainosus: Secondary | ICD-10-CM

## 2014-12-14 LAB — CBC WITH DIFFERENTIAL/PLATELET
Basophils Absolute: 0.1 10*3/uL (ref 0.0–0.1)
Basophils Relative: 1 % (ref 0–1)
Eosinophils Absolute: 0.2 10*3/uL (ref 0.0–0.7)
Eosinophils Relative: 3 % (ref 0–5)
HCT: 40 % (ref 36.0–46.0)
Hemoglobin: 13.5 g/dL (ref 12.0–15.0)
Lymphocytes Relative: 46 % (ref 12–46)
Lymphs Abs: 2.7 10*3/uL (ref 0.7–4.0)
MCH: 27.2 pg (ref 26.0–34.0)
MCHC: 33.8 g/dL (ref 30.0–36.0)
MCV: 80.6 fL (ref 78.0–100.0)
MPV: 9.3 fL (ref 8.6–12.4)
Monocytes Absolute: 0.2 10*3/uL (ref 0.1–1.0)
Monocytes Relative: 4 % (ref 3–12)
Neutro Abs: 2.7 10*3/uL (ref 1.7–7.7)
Neutrophils Relative %: 46 % (ref 43–77)
Platelets: 274 10*3/uL (ref 150–400)
RBC: 4.96 MIL/uL (ref 3.87–5.11)
RDW: 14.8 % (ref 11.5–15.5)
WBC: 5.9 10*3/uL (ref 4.0–10.5)

## 2014-12-14 LAB — COMPREHENSIVE METABOLIC PANEL
ALT: 11 U/L (ref 0–35)
AST: 15 U/L (ref 0–37)
Albumin: 4.3 g/dL (ref 3.5–5.2)
Alkaline Phosphatase: 62 U/L (ref 39–117)
BUN: 9 mg/dL (ref 6–23)
CO2: 28 mEq/L (ref 19–32)
Calcium: 8.9 mg/dL (ref 8.4–10.5)
Chloride: 104 mEq/L (ref 96–112)
Creat: 0.91 mg/dL (ref 0.50–1.10)
Glucose, Bld: 90 mg/dL (ref 70–99)
Potassium: 4.1 mEq/L (ref 3.5–5.3)
Sodium: 140 mEq/L (ref 135–145)
Total Bilirubin: 0.5 mg/dL (ref 0.2–1.2)
Total Protein: 7 g/dL (ref 6.0–8.3)

## 2014-12-14 MED ORDER — TOPIRAMATE 25 MG PO TABS: 25.0000 mg | ORAL_TABLET | Freq: Three times a day (TID) | ORAL | Status: DC

## 2014-12-14 NOTE — Patient Instructions (Signed)
At the very first sign of the headache take 800 mg of ibuprofen and you can repeated in about 4 hours. That doesn't work keep in touch. As the baclofen if you want

## 2014-12-14 NOTE — Progress Notes (Signed)
   Subjective:    Patient ID: Karen Hicks, female    DOB: October 18, 1970, 44 y.o.   MRN: [id number]  HPI She is here for medication check. She does have an underlying history of migraine headaches however is using baclofen more or less on an as-needed basis. She does take max fairly regularly. She has tried migraine meds in the past without success. She has an underlying history of reflux and does use Nexium 4 times per week. She had total knee replacement on the left in December and has now been released to going to a more rigorous PT program. As a complains of a one-month history of total body sweats that can occur at any time. His been no fever, chills, cough, congestion. She did have a hysterectomy several years ago and apparently they did leave one ovary in place. There is not a family history of early menopause.   Review of Systems     Objective:   Physical Exam Alert and in no distress. Tympanic membranes and canals are normal. Pharyngeal area is normal. Neck is supple without adenopathy or thyromegaly. Cardiac exam shows a regular sinus rhythm without murmurs or gallops. Lungs are clear to auscultation.Donald exam shows no masses or tenderness. Surgical scar noted on left knee.        Assessment & Plan:  Gastroesophageal reflux disease without esophagitis  Migraine without aura and without status migrainosus, not intractable - Plan: topiramate (TOPAMAX) 25 MG tablet  Status post total left knee replacement - Plan: topiramate (TOPAMAX) 25 MG tablet, CBC with Differential/Platelet  Hot flashes - Plan: topiramate (TOPAMAX) 25 MG tablet, CBC with Differential/Platelet, Comprehensive metabolic panel 's cussed treatment of her migraines using the Topamax. She will use of baclofen if needed however I explained that it probably is not helping much. Recommend she take 800 mg of ibuprofen at the first sign of a migraine and see if this will help. Encouraged her to work on a good knee  rehabilitation program including terminal extension exercises. May possibly need to do hormone evaluation at a later date.

## 2015-03-13 ENCOUNTER — Emergency Department (HOSPITAL_COMMUNITY)
Admission: EM | Admit: 2015-03-13 | Discharge: 2015-03-13 | Disposition: A | Discharge status: Home / Self Care | Payer: BC Managed Care – PPO | Attending: Emergency Medicine | Admitting: Emergency Medicine

## 2015-03-13 ENCOUNTER — Encounter (HOSPITAL_COMMUNITY): Payer: Self-pay | Admitting: Emergency Medicine

## 2015-03-13 ENCOUNTER — Emergency Department (HOSPITAL_BASED_OUTPATIENT_CLINIC_OR_DEPARTMENT_OTHER)
Admit: 2015-03-13 | Discharge: 2015-03-13 | Disposition: A | Discharge status: Home / Self Care | Payer: BC Managed Care – PPO | Attending: Emergency Medicine | Admitting: Emergency Medicine

## 2015-03-13 DIAGNOSIS — Z86718 Personal history of other venous thrombosis and embolism: Secondary | ICD-10-CM | POA: Insufficient documentation

## 2015-03-13 DIAGNOSIS — G4730 Sleep apnea, unspecified: Secondary | ICD-10-CM | POA: Diagnosis not present

## 2015-03-13 DIAGNOSIS — G43909 Migraine, unspecified, not intractable, without status migrainosus: Secondary | ICD-10-CM | POA: Insufficient documentation

## 2015-03-13 DIAGNOSIS — Z8739 Personal history of other diseases of the musculoskeletal system and connective tissue: Secondary | ICD-10-CM | POA: Diagnosis not present

## 2015-03-13 DIAGNOSIS — M79661 Pain in right lower leg: Secondary | ICD-10-CM | POA: Diagnosis not present

## 2015-03-13 DIAGNOSIS — Z862 Personal history of diseases of the blood and blood-forming organs and certain disorders involving the immune mechanism: Secondary | ICD-10-CM | POA: Insufficient documentation

## 2015-03-13 DIAGNOSIS — Z9981 Dependence on supplemental oxygen: Secondary | ICD-10-CM | POA: Diagnosis not present

## 2015-03-13 DIAGNOSIS — Z86711 Personal history of pulmonary embolism: Secondary | ICD-10-CM | POA: Insufficient documentation

## 2015-03-13 DIAGNOSIS — R202 Paresthesia of skin: Secondary | ICD-10-CM | POA: Insufficient documentation

## 2015-03-13 DIAGNOSIS — Z8719 Personal history of other diseases of the digestive system: Secondary | ICD-10-CM | POA: Insufficient documentation

## 2015-03-13 DIAGNOSIS — Z8639 Personal history of other endocrine, nutritional and metabolic disease: Secondary | ICD-10-CM | POA: Insufficient documentation

## 2015-03-13 DIAGNOSIS — Z880 Allergy status to penicillin: Secondary | ICD-10-CM | POA: Insufficient documentation

## 2015-03-13 DIAGNOSIS — M79609 Pain in unspecified limb: Secondary | ICD-10-CM

## 2015-03-13 DIAGNOSIS — M79604 Pain in right leg: Secondary | ICD-10-CM

## 2015-03-13 DIAGNOSIS — Z79899 Other long term (current) drug therapy: Secondary | ICD-10-CM | POA: Insufficient documentation

## 2015-03-13 LAB — BASIC METABOLIC PANEL
Anion gap: 9 (ref 5–15)
BUN: <5 mg/dL — ABNORMAL LOW (ref 6–20)
CO2: 23 mmol/L (ref 22–32)
Calcium: 9.5 mg/dL (ref 8.9–10.3)
Chloride: 105 mmol/L (ref 101–111)
Creatinine, Ser: 0.74 mg/dL (ref 0.44–1.00)
GFR calc Af Amer: >60 mL/min (ref >60)
GFR calc non Af Amer: >60 mL/min (ref >60)
Glucose, Bld: 119 mg/dL — ABNORMAL HIGH (ref 65–99)
Potassium: 3.8 mmol/L (ref 3.5–5.1)
Sodium: 137 mmol/L (ref 135–145)

## 2015-03-13 LAB — CBC WITH DIFFERENTIAL/PLATELET
Basophils Absolute: 0 10*3/uL (ref 0.0–0.1)
Basophils Relative: 1 % (ref 0–1)
Eosinophils Absolute: 0.1 10*3/uL (ref 0.0–0.7)
Eosinophils Relative: 2 % (ref 0–5)
HCT: 38.8 % (ref 36.0–46.0)
Hemoglobin: 12.9 g/dL (ref 12.0–15.0)
Lymphocytes Relative: 46 % (ref 12–46)
Lymphs Abs: 2.8 10*3/uL (ref 0.7–4.0)
MCH: 28 pg (ref 26.0–34.0)
MCHC: 33.2 g/dL (ref 30.0–36.0)
MCV: 84.2 fL (ref 78.0–100.0)
Monocytes Absolute: 0.3 10*3/uL (ref 0.1–1.0)
Monocytes Relative: 5 % (ref 3–12)
Neutro Abs: 2.9 10*3/uL (ref 1.7–7.7)
Neutrophils Relative %: 46 % (ref 43–77)
Platelets: 315 10*3/uL (ref 150–400)
RBC: 4.61 MIL/uL (ref 3.87–5.11)
RDW: 14 % (ref 11.5–15.5)
WBC: 6.2 10*3/uL (ref 4.0–10.5)

## 2015-03-13 NOTE — Discharge Instructions (Signed)
Take your home pain medication and/or muscle relaxers as needed for pain. Recommend heat therapy at home to help ease muscle soreness. Follow-up with your primary care physician as needed. Return here for any new or worsening symptoms.

## 2015-03-13 NOTE — ED Notes (Signed)
Pt transferred to vascular  

## 2015-03-13 NOTE — ED Notes (Signed)
Pt ambulatory with steady gait at discharge. Verbalizes understanding of discharge instructions. A/O x4. VSS.

## 2015-03-13 NOTE — Progress Notes (Signed)
VASCULAR LAB PRELIMINARY  PRELIMINARY  PRELIMINARY  PRELIMINARY  Right lower extremity venous Doppler completed.    Preliminary report:  There is no DVT or SVT noted in the right lower extremity.   Toriana Sponsel, RVT 03/13/2015, 1:40 PM

## 2015-03-13 NOTE — ED Provider Notes (Signed)
CSN: [id number]     Arrival date & time 03/13/15  1114 History   First MD Initiated Contact with Patient 03/13/15 1130     Chief Complaint  Patient presents with  . Claudication  . Leg Pain     (Consider location/radiation/quality/duration/timing/severity/associated sxs/prior Treatment) Patient is a 44 y.o. female presenting with leg pain. The history is provided by the patient and medical records.  Leg Pain   This is a 44 year old female with past medical history significant for migraine headaches, arthritis, anemia, history of postoperative DVT and PE not currently on anticoagulation, presenting to the ED for right calf pain. Patient states began yesterday evening and has been worsening since this time. She reports some intermittent paresthesias of her right foot and toes, none currently. States pain increases with walking. History of DVT a few years ago in her left calf which felt similar. She also admits to working a 12 hour shift at [employer] yesterday, however this is not out of the ordinary for her. She did recently have a left knee replacement in December 2015 by Dr. Jerl Santosalldorf who has performed numerous surgeries for her over the past few years.  She denies any current chest pain or shortness of breath. No intervention tried prior to arrival, patient admits that she does not take pain medication.  Past Medical History  Diagnosis Date  . Allergy   . GERD (gastroesophageal reflux disease)   . Lactose intolerance   . Migraine   . Arthritis   . Complication of anesthesia     takes her alittle longer to wake up  . PONV (postoperative nausea and vomiting)   . PE (pulmonary embolism)     in 1994 meniscus repair.  Saw Dr. Susann GivensLaLonde  . Anemia     when she was young  . Sleep apnea     2007.  Uses the mask & machine when she needs it.  Dr. Susann GivensLalonde is aware   Past Surgical History  Procedure Laterality Date  . Abdominal hysterectomy  01/2006    FIBROIDS  . Knee surgery Bilateral      multiple  . 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 Right knee moderate djd with multiple surgeries  07/19/14  . Right wrist arthroscopy  2009  . Total knee arthroplasty Left 08/03/2014    dr Jerl Santosdalldorf  . Total knee arthroplasty Left 08/03/2014    Procedure: LEFT TOTAL KNEE ARTHROPLASTY;  Surgeon: Velna OchsPeter G Dalldorf, MD;  Location: [facility] OR;  Service: Orthopedics;  Laterality: Left;  . Hardware removal Left 08/03/2014    Procedure: HARDWARE REMOVAL;  Surgeon: Velna OchsPeter G Dalldorf, MD;  Location: [facility] OR;  Service: Orthopedics;  Laterality: Left;   Family History  Problem Relation Age of Onset  . Arthritis Mother   . Diabetes Mother   . Heart disease Mother   . Hypertension Mother   . Kidney disease Mother   . Stroke Mother   . Gallbladder disease Mother   . Glaucoma Mother   . Seizures Mother   . Arthritis Father   . Diabetes Father   . Hypertension Father   . Kidney disease Father   . Gallbladder disease Father    History  Substance Use Topics  . Smoking status: Never Smoker   . Smokeless tobacco: Never Used  . Alcohol Use: Yes     Comment: "with a good steak"   OB History    No data available     Review of Systems  Musculoskeletal: Positive for myalgias and arthralgias.  All other systems reviewed and are negative.     Allergies  Mushroom extract complex; Peach flavor; Adhesive; and Penicillins  Home Medications   Prior to Admission medications   Medication Sig Start Date End Date Taking? Authorizing Provider  baclofen (LIORESAL) 10 MG tablet Take 1 tablet (10 mg total) by mouth 3 (three) times daily. Patient taking differently: Take 10 mg by mouth 3 (three) times daily as needed (for headaches).  11/30/13   Ronnald Nian, MD  chlorhexidine (PERIDEX) 0.12 % solution Use as directed 15 mLs in the mouth or throat 2 (two) times daily. Use after brushing teeth    Historical Provider, MD  Colloidal Oatmeal (NEOSPORIN ECZEMA ESSENTIALS) 1 % CREA Apply 1  application topically daily as needed (for eczema).    Historical Provider, MD  diphenhydrAMINE (BENADRYL) 25 mg capsule Take 25 mg by mouth daily as needed for allergies.    Historical Provider, MD  esomeprazole (NEXIUM) 20 MG capsule Take 1 capsule (20 mg total) by mouth daily before breakfast. Patient not taking: Reported on 12/14/2014 06/10/13   Ronnald Nian, MD  HYDROcodone-acetaminophen [facility]) 10-325 MG per tablet Take 1-2 tablets by mouth every 4 (four) hours as needed for moderate pain or severe pain. 08/05/14   Elodia Florence, PA-C  loratadine (CLARITIN) 10 MG tablet Take 10 mg by mouth daily as needed for allergies.    Historical Provider, MD  methocarbamol (ROBAXIN) 500 MG tablet Take 1 tablet (500 mg total) by mouth every 6 (six) hours as needed for muscle spasms. 08/05/14   Elodia Florence, PA-C  topiramate (TOPAMAX) 25 MG tablet Take 1 tablet (25 mg total) by mouth 3 (three) times daily. 12/14/14   Ronnald Nian, MD   BP 129/75 mmHg  Pulse 70  Resp 20  SpO2 98%   Physical Exam  Constitutional: She is oriented to person, place, and time. She appears well-developed and well-nourished. No distress.  HENT:  Head: Normocephalic and atraumatic.  Mouth/Throat: Oropharynx is clear and moist.  Eyes: Conjunctivae and EOM are normal. Pupils are equal, round, and reactive to light.  Neck: Normal range of motion. Neck supple.  Cardiovascular: Normal rate, regular rhythm and normal heart sounds.   Pulmonary/Chest: Effort normal and breath sounds normal. No respiratory distress. She has no wheezes.  Musculoskeletal: Normal range of motion. She exhibits no edema.  Right calf with tenderness along lateral aspect; no significant leg swelling; no palpable cords or overlying skin changes; DP pulses intact bilaterally; normal sensation throughout leg and foot Bilateral knees with numerous well healed surgical scars present  Neurological: She is alert and oriented to person, place, and time.  Skin: Skin  is warm and dry. She is not diaphoretic.  Psychiatric: She has a normal mood and affect.  Nursing note and vitals reviewed.   ED Course  Procedures (including critical care time) Labs Review Labs Reviewed  BASIC METABOLIC PANEL - Abnormal; Notable for the following:    Glucose, Bld 119 (*)    BUN <5 (*)    All other components within normal limits  CBC WITH DIFFERENTIAL/PLATELET    Imaging Review No results found.  VASCULAR LAB PRELIMINARY PRELIMINARY PRELIMINARY PRELIMINARY  Right lower extremity venous Doppler completed.   Preliminary report: There is no DVT or SVT noted in the right lower extremity.   KANADY, CANDACE, RVT 03/13/2015, 1:40 PM   EKG Interpretation None      MDM   Final diagnoses:  Right leg pain   44 year old female here with right calf pain. She does have history of DVT and PE, not currently on anticoagulation. Recent left knee replacement in December 2015. Patient afebrile, nontoxic. Right calf has some tenderness along the lateral aspect. No overlying skin changes, warmth to touch, or palpable cords. Leg is neurovascularly intact, patient ambulatory with steady gait. Labwork is reassuring. Venous duplex negative for DVT or SVT. Patient remains without any chest pain or shortness of breath to suggest PE. Vital signs remained stable on room air. Discharge home with supportive care. Patient states she has pain medication and muscle relaxers at home from her prior knee replacement that she will take if needed.  Strict return precautions given for any new or worsening symptoms.  Discussed plan with patient, he/she acknowledged understanding and agreed with plan of care.  Garlon Hatchet, PA-C 03/13/15 1439  Blake Divine, MD 03/15/15 [phone]

## 2015-03-13 NOTE — ED Notes (Signed)
Pt to ED for evaluation of right calf pain with foot/toe tingling in right foot. Pulses present bilaterally, equal. Pt has hx of left calf DVT and PE. Pt states this feels similar. A/oX4. Pt states she [facility] 12 hours [facility] [employer] [facility]. Left knee replacement December 2015.

## 2015-03-30 ENCOUNTER — Ambulatory Visit (INDEPENDENT_AMBULATORY_CARE_PROVIDER_SITE_OTHER): Payer: BC Managed Care – PPO | Admitting: Family Medicine

## 2015-03-30 ENCOUNTER — Encounter: Payer: Self-pay | Admitting: Family Medicine

## 2015-03-30 VITALS — BP 124/70 | HR 76 | Temp 96.6°F | Ht 67.0 in | Wt 188.0 lb

## 2015-03-30 DIAGNOSIS — K529 Noninfective gastroenteritis and colitis, unspecified: Secondary | ICD-10-CM

## 2015-03-30 DIAGNOSIS — R109 Unspecified abdominal pain: Secondary | ICD-10-CM

## 2015-03-30 LAB — POCT URINALYSIS DIPSTICK
Bilirubin, UA: NEGATIVE
Blood, UA: NEGATIVE
Glucose, UA: NEGATIVE
Ketones, UA: NEGATIVE
Leukocytes, UA: NEGATIVE
Nitrite, UA: NEGATIVE
Protein, UA: NEGATIVE
Spec Grav, UA: >=1.03
Urobilinogen, UA: NEGATIVE
pH, UA: 5.5

## 2015-03-30 MED ORDER — ONDANSETRON 4 MG PO TBDP: 4.0000 mg | ORAL_TABLET | Freq: Three times a day (TID) | ORAL | Status: DC | PRN

## 2015-03-30 NOTE — Progress Notes (Signed)
Chief Complaint  Patient presents with  . Abdominal Pain    and bloating since this morning. Had vomiting and diarrhea-took pepto emetrol. Last vomiting occured around 11:30, last diarrhea arpund 1:10pm.    6:30 this morning, her alarm went off.  She felt bloated, nauseated, having left sided abdominal pain.  She vomited, had diarrhea.  She took emetrol, but vomited a couple of times after.  No vomiting since 11:30 this morning.  She hasn't been able to keep anything down (hasn't eaten/had anything to drink since last episode of vomiting).  She has had diarrhea at least 5-6 times.  Stool was very watery, although her last stool at 1pm was more solid (and black; had taken Pepto bismol). No blood or mucus in the stool.  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  She has been taking Pepto bismol, and took 2 doses of emetrol. Stomach pain is subsiding; still has a dull ache, "squeezing" on the left side.  No recent antibiotics.  No travel out of the country or camping. No spoiled or undercooked foods.  PMH, PSH, SH reviewed.  Outpatient Encounter Prescriptions as of 03/30/2015  Medication Sig Note  . anti-nausea (EMETROL) solution Take 10 mLs by mouth every 15 (fifteen) minutes as needed for nausea or vomiting.   . bismuth subsalicylate (PEPTO BISMOL) 262 MG chewable tablet Chew 524 mg by mouth as needed.   . chlorhexidine (PERIDEX) 0.12 % solution Use as directed 15 mLs in the mouth or throat 2 (two) times daily. Use after brushing teeth   . esomeprazole (NEXIUM) 20 MG capsule Take 1 capsule (20 mg total) by mouth daily before breakfast. 03/30/2015: Didn't take this morning  . topiramate (TOPAMAX) 25 MG tablet Take 1 tablet (25 mg total) by mouth 3 (three) times daily.   . baclofen (LIORESAL) 10 MG tablet Take 1 tablet (10 mg total) by mouth 3 (three) times daily. (Patient not taking: Reported on 03/30/2015)   . Colloidal Oatmeal (NEOSPORIN ECZEMA ESSENTIALS) 1 % CREA Apply 1  application topically daily as needed (for eczema).   . diphenhydrAMINE (BENADRYL) 25 mg capsule Take 25 mg by mouth daily as needed for allergies.   Marland Kitchen HYDROcodone-acetaminophen (NORCO) 10-325 MG per tablet Take 1-2 tablets by mouth every 4 (four) hours as needed for moderate pain or severe pain. (Patient not taking: Reported on 03/30/2015) 03/30/2015: Postoperative only (dental and knee surgery); NOT using  . loratadine (CLARITIN) 10 MG tablet Take 10 mg by mouth daily as needed for allergies.   . methocarbamol (ROBAXIN) 500 MG tablet Take 1 tablet (500 mg total) by mouth every 6 (six) hours as needed for muscle spasms. (Patient not taking: Reported on 03/30/2015)    No facility-administered encounter medications on file as of 03/30/2015.   Allergies  Allergen Reactions  . Mushroom Extract Complex Anaphylaxis  . Peach Flavor Anaphylaxis  . Adhesive [Tape] Hives  . Penicillins     Tingling and shaking   ROS: no fever, chills, headache, dizziness, chest pain, palpitations, shortness of breath, dysuria, vaginal discharge, URI symptoms, bleeding, bruising, rash, numbness/tingling or other neuro symptoms.  See HPI.  PHYSICAL EXAM: BP 124/70 mmHg  Pulse 76  Temp(Src) 96.6 F (35.9 C) (Tympanic)  Ht  (1.702 m)  Wt 188 lb (85.276 kg)  BMI 29.44 kg/m2  Well developed, mildly ill/tired appearing female in no distress HEENT: PERRL, EOMI, conjunctiva clear.  Mucus membranes are moist.  +dentures. No mucosal lesions noted. Neck: no lymphadenopathy  or mass Heart: regular rate and rhythm without murmur Lungs: clear bilaterally Abdomen: soft, normal bowel sounds.  She is mildly tender in the epigastrium and the left abdomen.  No rebound tenderness or guarding, no masses. Skin: normal turgor, no rash Extremities: no edema   ASSESSMENT/PLAN:  Gastroenteritis, acute - Plan: ondansetron (ZOFRAN ODT) 4 MG disintegrating tablet  Abdominal pain, unspecified abdominal location - Plan: POCT  Urinalysis Dipstick   Drink plenty of fluids.  Use tylenol if needed for abdominal pain (not ibuprofen, since it can cause stomach pain, and should be taken with food). Drink the fluids slowly--small, infrequent sips rather than drinking it fast, and then throwing it back up.  Use imodium if needed for diarrhea (vs continue Pepto Bismol). Use the zofran for nausea/vomiting. Drink clear liquids, and advance to a very bland diet (BRAT diet) as tolerated. Go to ER if you are dehydrated (we reviewed these signs), high fever, worsening abdominal pain, blood in the stool, blood in the vomit, or it looks like coffee-grounds.  Consider probiotics.

## 2015-03-30 NOTE — Patient Instructions (Signed)
Drink plenty of fluids.  Use tylenol if needed for abdominal pain (not ibuprofen, since it can cause stomach pain, and should be taken with food). Drink the fluids slowly--small, infrequent sips rather than drinking it fast, and then throwing it back up.  Use imodium if needed for diarrhea (vs continue Pepto Bismol). Use the zofran for nausea/vomiting. Drink clear liquids, and advance to a very bland diet (BRAT diet) as tolerated. Go to ER if you are dehydrated (we reviewed these signs), high fever, worsening abdominal pain, blood in the stool, blood in the vomit, or it looks like coffee-grounds.  Consider using probiotics to help get bowels back to normal faster. Food Choices to Help Relieve Diarrhea When you have diarrhea, the foods you eat and your eating habits are very important. Choosing the right foods and drinks can help relieve diarrhea. Also, because diarrhea can last up to 7 days, you need to replace lost fluids and electrolytes (such as sodium, potassium, and chloride) in order to help prevent dehydration.  WHAT GENERAL GUIDELINES DO I NEED TO FOLLOW?  Slowly drink 1 cup (8 oz) of fluid for each episode of diarrhea. If you are getting enough fluid, your urine will be clear or pale yellow.  Eat starchy foods. Some good choices include white rice, white toast, pasta, low-fiber cereal, baked potatoes (without the skin), saltine crackers, and bagels.  Avoid large servings of any cooked vegetables.  Limit fruit to two servings per day. A serving is  cup or 1 small piece.  Choose foods with less than 2 g of fiber per serving.  Limit fats to less than 8 tsp (38 g) per day.  Avoid fried foods.  Eat foods that have probiotics in them. Probiotics can be found in certain dairy products.  Avoid foods and beverages that may increase the speed at which food moves through the stomach and intestines (gastrointestinal tract). Things to avoid include:  High-fiber foods, such as dried  fruit, raw fruits and vegetables, nuts, seeds, and whole grain foods.  Spicy foods and high-fat foods.  Foods and beverages sweetened with high-fructose corn syrup, honey, or sugar alcohols such as xylitol, sorbitol, and mannitol. WHAT FOODS ARE RECOMMENDED? Grains White rice. White, [place], or pita breads (fresh or toasted), including plain rolls, buns, or bagels. White pasta. Saltine, soda, or graham crackers. Pretzels. Low-fiber cereal. Cooked cereals made with water (such as cornmeal, farina, or cream cereals). Plain muffins. Matzo. Melba toast. Zwieback.  Vegetables Potatoes (without the skin). Strained tomato and vegetable juices. Most well-cooked and canned vegetables without seeds. Tender lettuce. Fruits Cooked or canned applesauce, apricots, cherries, fruit cocktail, grapefruit, peaches, pears, or plums. Fresh bananas, apples without skin, cherries, grapes, cantaloupe, grapefruit, peaches, oranges, or plums.  Meat and Other Protein Products Baked or boiled chicken. Eggs. Tofu. Fish. Seafood. Smooth peanut butter. Ground or well-cooked tender beef, ham, veal, lamb, pork, or poultry.  Dairy Plain yogurt, kefir, and unsweetened liquid yogurt. Lactose-free milk, buttermilk, or soy milk. Plain hard cheese. Beverages Sport drinks. Clear broths. Diluted fruit juices (except prune). Regular, caffeine-free sodas such as ginger ale. Water. Decaffeinated teas. Oral rehydration solutions. Sugar-free beverages not sweetened with sugar alcohols. Other Bouillon, broth, or soups made from recommended foods.  The items listed above may not be a complete list of recommended foods or beverages. Contact your dietitian for more options. WHAT FOODS ARE NOT RECOMMENDED? Grains Whole grain, whole wheat, bran, or rye breads, rolls, pastas, crackers, and cereals. Wild or brown rice. Cereals  that contain more than 2 g of fiber per serving. Corn tortillas or taco shells. Cooked or dry oatmeal. Granola.  Popcorn. Vegetables Raw vegetables. Cabbage, broccoli, Brussels sprouts, artichokes, baked beans, beet greens, corn, kale, legumes, peas, sweet potatoes, and yams. Potato skins. Cooked spinach and cabbage. Fruits Dried fruit, including raisins and dates. Raw fruits. Stewed or dried prunes. Fresh apples with skin, apricots, mangoes, pears, raspberries, and strawberries.  Meat and Other Protein Products Chunky peanut butter. Nuts and seeds. Beans and lentils. Tomasa Blase.  Dairy High-fat cheeses. Milk, chocolate milk, and beverages made with milk, such as milk shakes. Cream. Ice cream. Sweets and Desserts Sweet rolls, doughnuts, and sweet breads. Pancakes and waffles. Fats and Oils Butter. Cream sauces. Margarine. Salad oils. Plain salad dressings. Olives. Avocados.  Beverages Caffeinated beverages (such as coffee, tea, soda, or energy drinks). Alcoholic beverages. Fruit juices with pulp. Prune juice. Soft drinks sweetened with high-fructose corn syrup or sugar alcohols. Other Coconut. Hot sauce. Chili powder. Mayonnaise. Gravy. Cream-based or milk-based soups.  The items listed above may not be a complete list of foods and beverages to avoid. Contact your dietitian for more information. WHAT SHOULD I DO IF I BECOME DEHYDRATED? Diarrhea can sometimes lead to dehydration. Signs of dehydration include dark urine and dry mouth and skin. If you think you are dehydrated, you should rehydrate with an oral rehydration solution. These solutions can be purchased at pharmacies, retail stores, or online.  Drink -1 cup (120-240 mL) of oral rehydration solution each time you have an episode of diarrhea. If drinking this amount makes your diarrhea worse, try drinking smaller amounts more often. For example, drink 1-3 tsp (5-15 mL) every 5-10 minutes.  A general rule for staying hydrated is to drink 1-2 L of fluid per day. Talk to your health care provider about the specific amount you should be drinking each day.  Drink enough fluids to keep your urine clear or pale yellow. Document Released: 11/10/2003 Document Revised: 08/25/2013 Document Reviewed: 07/13/2013 [facility] Patient Information 2015 [place], [place]. This information is not intended to replace advice given to you by your health care provider. Make sure you discuss any questions you have with your health care provider.

## 2015-08-03 ENCOUNTER — Encounter: Payer: Self-pay | Admitting: Family Medicine

## 2015-08-03 ENCOUNTER — Ambulatory Visit (INDEPENDENT_AMBULATORY_CARE_PROVIDER_SITE_OTHER): Payer: BC Managed Care – PPO | Admitting: Family Medicine

## 2015-08-03 VITALS — BP 124/80 | HR 70 | Ht 67.0 in | Wt 198.0 lb

## 2015-08-03 DIAGNOSIS — G43009 Migraine without aura, not intractable, without status migrainosus: Secondary | ICD-10-CM

## 2015-08-03 MED ORDER — SUMATRIPTAN SUCCINATE 100 MG PO TABS: 100.0000 mg | ORAL_TABLET | ORAL | Status: DC | PRN

## 2015-08-03 MED ORDER — KETOROLAC TROMETHAMINE 60 MG/2ML IM SOLN
60.0000 mg | Freq: Once | INTRAMUSCULAR | Status: AC
  Administered 2015-08-03: 60 mg via INTRAMUSCULAR

## 2015-08-03 MED ORDER — METHYLPREDNISOLONE ACETATE 80 MG/ML IJ SUSP
80.0000 mg | Freq: Once | INTRAMUSCULAR | Status: AC
  Administered 2015-08-03: 80 mg via INTRAMUSCULAR

## 2015-08-03 NOTE — Patient Instructions (Signed)
The next time that you have a headache, take two Aleve and one Imitrex and repeat the Imitrex in two hours if needed

## 2015-08-03 NOTE — Progress Notes (Signed)
   Subjective:    Patient ID: Karen Hicks, female    DOB: May 17, 1971, 44 y.o.   MRN: [id number]  HPI She is here for consult concerning migraine headache. She has a history of migraine headaches and presently is taking Topamax and baclofen. Normally she has a headache every 6 months. This headache has lasted approximately 3 days. She states that in the past 2/10 have not been very useful and she usually goes to the emergency room and get shots. She describes the headache as left-sided, constant but has had difficulty with photophobia and phonophobia as well as smell.   Review of Systems     Objective:   Physical Exam Alert and in no distress otherwise not examined       Assessment & Plan:  Migraine without aura and without status migrainosus, not intractable - Plan: SUMAtriptan (IMITREX) 100 MG tablet  I will give her Toradol and Depo-Medrol. Also instructed her on proper use of Imitrex. She will let me know how this works. Do not feel the need to change her dosing regimen for prevention at this time.

## 2016-04-03 ENCOUNTER — Ambulatory Visit (INDEPENDENT_AMBULATORY_CARE_PROVIDER_SITE_OTHER): Payer: BC Managed Care – PPO | Admitting: Family Medicine

## 2016-04-03 ENCOUNTER — Encounter: Payer: Self-pay | Admitting: Family Medicine

## 2016-04-03 VITALS — BP 140/88 | HR 80 | Wt 203.0 lb

## 2016-04-03 DIAGNOSIS — G43109 Migraine with aura, not intractable, without status migrainosus: Secondary | ICD-10-CM | POA: Diagnosis not present

## 2016-04-03 MED ORDER — ZOLMITRIPTAN 5 MG NA SOLN: 1.0000 | NASAL | 0 refills | Status: DC | PRN

## 2016-04-03 MED ORDER — AMITRIPTYLINE HCL 10 MG PO TABS: 25.0000 mg | ORAL_TABLET | Freq: Every day | ORAL | Status: DC

## 2016-04-03 NOTE — Patient Instructions (Signed)
Me know how the Zomig works and the increase in the amitriptyline. Call me next week

## 2016-04-03 NOTE — Progress Notes (Signed)
   Subjective:    Patient ID: Karen Hicks, female    DOB: 1971/06/02, 45 y.o.   MRN: [id number]  HPI She is here for consultation concerning headaches. He has a long history of headaches and is seen by Dr. Neale Burly for this. Presently she is taking amitriptyline as well topamax and baclofen.He does take Imitrex however the Imitrex does make her drowsy and therefore makes it difficult for her to take this at work. She also uses baclofen.Over the last week she has noted an increase in her headaches. She does have an aura and uses Aleve followed by baclofen. She usually waits till she gets home before she can use the Imitrex. Normally she has 5 headaches per month but in the last week or so she has had 5 per week.   Review of Systems     Objective:   Physical Exam Alert and in no distress otherwise not examined.      Assessment & Plan:  Migraine with aura and without status migrainosus, not intractable - Plan: amitriptyline (ELAVIL) tablet 25 mg, zolmitriptan (ZOMIG) 5 MG nasal solution, Ambulatory referral to Neurology  To consider possible Botox injections.

## 2016-04-11 ENCOUNTER — Encounter: Payer: Self-pay | Admitting: Neurology

## 2016-04-11 ENCOUNTER — Ambulatory Visit (INDEPENDENT_AMBULATORY_CARE_PROVIDER_SITE_OTHER): Payer: BC Managed Care – PPO | Admitting: Neurology

## 2016-04-11 VITALS — BP 100/70 | HR 76 | Ht 67.0 in | Wt 204.3 lb

## 2016-04-11 DIAGNOSIS — G43109 Migraine with aura, not intractable, without status migrainosus: Secondary | ICD-10-CM

## 2016-04-11 DIAGNOSIS — G43709 Chronic migraine without aura, not intractable, without status migrainosus: Secondary | ICD-10-CM | POA: Diagnosis not present

## 2016-04-11 MED ORDER — ZOLMITRIPTAN 5 MG NA SOLN: 1.0000 | NASAL | 5 refills | Status: DC | PRN

## 2016-04-11 NOTE — Progress Notes (Signed)
[facility] Clinic Note - Initial Visit   Date: 04/11/16  Karen Hicks MRN: [id number] DOB: 07-Nov-1970   Dear Dr. Susann GivensLalonde:  Thank you for your kind referral of Karen Hicks for consultation of headaches. Although her history is well known to you, please allow us to reiterate it for the purpose of our medical record. The patient was accompanied to the clinic by self.   History of Present Illness: Karen Hicks is a 45 y.o. ambidextrous-handed African American female with GERD presenting for evaluation of migraines.    Starting at age of 89, she had headaches at the base of the head occurring around once per month.  She was on preventative therapy for about a year, but she does not recall the name.  She feels that the stress of school, atheletics, and band contributed to her headaches.  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  Her headaches were slightly better during this time.  Around her mid-20s as she entered the work force, her headaches became more frequent and became bifrontal and periorbital.  It is described as throbbing, pounding, and shooting. Headaches usually last about 1-2 hours, with intensity 6/10.  Rarely, they can last 3 days.  She was getting headaches 3-4 times per week, so referred to Dr. Neale BurlyFreeman.  He started her on topirimate 75mg  which helped control headaches. This was switched to amitriptyline 25mg  at bedtime several years ago.  Headaches usually occur about 1-2 times per week.  However, last month they occurred more frequently ~5 headache days/week.  She has noticed stress was greater at work and triggers headaches.  She has sensitivity to smell and light.  Foods such as chocolate triggers headaches.  Headaches do not wake her up from sleeping and are not worse with laying supine, coughing, laughing, or straining. Headaches have not changes in character.     She takes baclofen 10mg  as  needed for headaches.  She restarted imitrex 100mg  earlier this year for migraines.  She uses this about 2-3 times per month and takes this only when she is at home because of sedation.  Last week, she was given a sample of Zomig 5mg  5mg  nasal spray which helped relieve her headache within 15 minutes and did not have side effects of sedation.  Amitriptyline helps keeps headaches under control, but she does not wish to be on a daily preventative medication and is interested in Botox.  She has strong family history of migraines.    Out-side paper records, electronic medical record, and images have been reviewed where available and summarized as:   Lab Results  Component Value Date   HGBA1C 6.6 (H) 06/10/2013    Past Medical History:  Diagnosis Date  . Allergy   . Anemia    when she was young  . Arthritis   . Complication of anesthesia    takes her alittle longer to wake up  . GERD (gastroesophageal reflux disease)   . Lactose intolerance   . Migraine   . PE (pulmonary embolism)    in 1994 meniscus repair.  Saw Dr. Susann GivensLaLonde  . PONV (postoperative nausea and vomiting)   . Sleep apnea    2007.  Uses the mask & machine when she needs it.  Dr. Susann GivensLalonde is aware    Past Surgical History:  Procedure Laterality Date  . ABDOMINAL HYSTERECTOMY  01/2006   FIBROIDS  . HARDWARE REMOVAL Left 08/03/2014   Procedure: HARDWARE REMOVAL;  Surgeon: Velna OchsPeter G Dalldorf,  MD;  Location: [facility] OR;  Service: Orthopedics;  Laterality: Left;  . KNEE SURGERY Bilateral    multiple  . 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 right wrist arthroscopy  2009  . TOTAL KNEE ARTHROPLASTY Left 08/03/2014   dr Jerl Santos  . TOTAL KNEE ARTHROPLASTY Left 08/03/2014   Procedure: LEFT TOTAL KNEE ARTHROPLASTY;  Surgeon: Velna Ochs, MD;  Location: [facility] OR;  Service: Orthopedics;  Laterality: Left;     Medications:  Outpatient Encounter  Prescriptions as of 04/11/2016  Medication Sig Note  . anti-nausea (EMETROL) solution Take 10 mLs by mouth every 15 (fifteen) minutes as needed for nausea or vomiting.   . baclofen (LIORESAL) 10 MG tablet Take 1 tablet (10 mg total) by mouth 3 (three) times daily.   Marland Kitchen bismuth subsalicylate (PEPTO BISMOL) 262 MG chewable tablet Chew 524 mg by mouth as needed.   . Colloidal Oatmeal (NEOSPORIN ECZEMA ESSENTIALS) 1 % CREA Apply 1 application topically daily as needed (for eczema).   . diphenhydrAMINE (BENADRYL) 25 mg capsule Take 25 mg by mouth daily as needed for allergies.   Marland Kitchen esomeprazole (NEXIUM) 20 MG capsule Take 1 capsule (20 mg total) by mouth daily before breakfast. 03/30/2015: Didn't take this morning  . HYDROcodone-acetaminophen (NORCO) 10-325 MG per tablet Take 1-2 tablets by mouth every 4 (four) hours as needed for moderate pain or severe pain. 03/30/2015: Postoperative only (dental and knee surgery); NOT using  . loratadine (CLARITIN) 10 MG tablet Take 10 mg by mouth daily as needed for allergies.   . methocarbamol (ROBAXIN) 500 MG tablet Take 1 tablet (500 mg total) by mouth every 6 (six) hours as needed for muscle spasms.   . ondansetron (ZOFRAN ODT) 4 MG disintegrating tablet Take 1 tablet (4 mg total) by mouth every 8 (eight) hours as needed for nausea or vomiting.   . SUMAtriptan (IMITREX) 100 MG tablet Take 1 tablet (100 mg total) by mouth every 2 (two) hours as needed for migraine. May repeat in 2 hours if headache persists or recurs.   Marland Kitchen zolmitriptan (ZOMIG) 5 MG nasal solution Place 1 spray into the nose as needed for migraine.   . [DISCONTINUED] chlorhexidine (PERIDEX) 0.12 % solution Use as directed 15 mLs in the mouth or throat 2 (two) times daily. Use after brushing teeth   . [DISCONTINUED] doxycycline (VIBRA-TABS) 100 MG tablet  04/11/2016: Received from: External Pharmacy  . [DISCONTINUED] tinidazole (TINDAMAX) 500 MG tablet  04/11/2016: Received from: External Pharmacy  .  [DISCONTINUED] topiramate (TOPAMAX) 25 MG tablet Take 1 tablet (25 mg total) by mouth 3 (three) times daily.   . [DISCONTINUED] zolmitriptan (ZOMIG) 5 MG nasal solution Place 1 spray into the nose as needed for migraine.    Facility-Administered Encounter Medications as of 04/11/2016  Medication  . amitriptyline (ELAVIL) tablet 25 mg     Allergies:  Allergies  Allergen Reactions  . Mushroom Extract Complex Anaphylaxis  . Peach Flavor Anaphylaxis  . Adhesive [Tape] Hives  . Penicillins     Tingling and shaking    Family History: Family History  Problem Relation Age of Onset  . Arthritis Mother   . Diabetes Mother   . Heart disease Mother   . Hypertension Mother   . Kidney disease Mother   . Stroke Mother   . Gallbladder disease Mother   . Glaucoma Mother   . Seizures Mother   . Migraines Mother   .  Arthritis Father   . Diabetes Father   . Hypertension Father   . Kidney disease Father   . Gallbladder disease Father   . Migraines Sister   . Migraines Brother   . Healthy Son     Social History: Social History  Substance Use Topics  . Smoking status: Never Smoker  . Smokeless tobacco: Never Used  . Alcohol use Yes     Comment: "with a good steak"   Social History   Social History Narrative   Lives with son in a one story home.  Works as a Loss adjuster, chartered at [employer].  Education: Masters    Review of Systems:  CONSTITUTIONAL: No fevers, chills, night sweats, or weight loss.   EYES: No visual changes or eye pain ENT: No hearing changes.  No history of nose bleeds.   RESPIRATORY: No cough, wheezing and shortness of breath.   CARDIOVASCULAR: Negative for chest pain, and palpitations.   GI: Negative for abdominal discomfort, blood in stools or black stools.  No recent change in bowel habits.   GU:  No history of incontinence.   MUSCLOSKELETAL: +history of joint pain or swelling.  No myalgias.   SKIN: Negative for lesions, rash, and itching.   HEMATOLOGY/ONCOLOGY:  Negative for prolonged bleeding, bruising easily, and swollen nodes.     ENDOCRINE: Negative for cold or heat intolerance, polydipsia or goiter.   PSYCH:  No depression or anxiety symptoms.   NEURO: As Above.   Vital Signs:  BP 100/70   Pulse 76   Ht  (1.702 m)   Wt 204 lb 5 oz (92.7 kg)   SpO2 98%   BMI 32.00 kg/m    General Medical Exam:   General:  Well appearing, comfortable.   Eyes/ENT: see cranial nerve examination.   Neck: No masses appreciated.  Full range of motion without tenderness.  No carotid bruits. Respiratory:  Clear to auscultation, good air entry bilaterally.   Cardiac:  Regular rate and rhythm, no murmur.   Extremities:  No deformities, edema, or skin discoloration.  Skin:  No rashes or lesions.  Neurological Exam: MENTAL STATUS including orientation to time, place, person, recent and remote memory, attention span and concentration, language, and fund of knowledge is normal.  Speech is not dysarthric.  CRANIAL NERVES: II:  No visual field defects.  Unremarkable fundi.   III-IV-VI: Pupils equal round and reactive to light.  Normal conjugate, extra-ocular eye movements in all directions of gaze.  No nystagmus.  No ptosis.   V:  Normal facial sensation.     VII:  Normal facial symmetry and movements.  VIII:  Normal hearing and vestibular function.   IX-X:  Normal palatal movement.   XI:  Normal shoulder shrug and head rotation.   XII:  Normal tongue strength and range of motion, no deviation or fasciculation.  MOTOR:  Motor strength is 5/5 throughout.  No atrophy, fasciculations or abnormal movements.  No pronator drift.  Tone is normal.    MSRs: Reflexes are 1+/4 throughout.  Plantars are downgoing.  SENSORY:  Reduced perception of temperature over the left leg which does not follow nerve distribution.  Otherwise, normal and symmetric perception of light touch, pinprick, vibration, and proprioception.     COORDINATION/GAIT: Normal finger-to-  nose-finger.  Intact rapid alternating movements bilaterally.  Able to rise from a chair without using arms.  Gait narrow based and stable. Tandem and stressed gait intact.    IMPRESSION: Chronic migraine without aura, recently worse due  to increase in work-related stress  - I reassured patient that I do not see anything worrisome on her exam to suggest secondary headache or the need for intracranial imaging  - She has previously taken topiramate 75mg  daily and imitrex 100mg    - Currently on amitriptyine 25mg  at bedtime as preventative which helps, but she does not wish to be on a daily oral medication, and is interested in Botox therapy.  Risks and benefits of the medication were discussed and she is agreeable to proceed.  We will start prior authorization and contact her when approved to schedule the appointment  - Continue zomig 5mg  as needed for acute migraine as she does not have side effects of sedation with this, like she did with imitrex. Discontinue imitrex.  Refills and coupon card was provided for Zomig.    The duration of this appointment visit was 45 minutes of face-to-face time with the patient.  Greater than 50% of this time was spent in counseling, explanation of diagnosis, planning of further management, and coordination of care.   Thank you for allowing me to participate in patient's care.  If I can answer any additional questions, I would be pleased to do so.    Sincerely,    Sarafina Puthoff K. Allena Katz, DO

## 2016-04-11 NOTE — Patient Instructions (Addendum)
1.  Continue your medications as you are taking them 2.  We will contact you once we have an approval for Botox injection

## 2016-04-12 NOTE — Progress Notes (Signed)
Botox submitted .

## 2016-07-02 ENCOUNTER — Other Ambulatory Visit: Payer: Self-pay | Admitting: Orthopaedic Surgery

## 2016-07-21 NOTE — Pre-Procedure Instructions (Addendum)
Karen Hicks  07/21/2016      Your procedure is scheduled on November 28  [facility] at 1055 A.M.  Call this number if you have problems the morning of surgery:  [phone]   Remember:  Do not eat food or drink liquids after midnight.  Take these medicines the morning of surgery with A SIP OF WATER Baclofen (if needed), Diphenhydramine (if needed), Nexium, Loratadine (if needed), Zofran (if needed), Visine, Zomig (if needed)   STOP/ Do not take Aspirin, Aleve, Naproxen, Advil, Ibuprofen, Motrin, Vitamins, Herbs, or Supplements starting November 21   Do not wear jewelry, make-up or nail polish.  Do not wear lotions, powders, or perfumes, or deoderant.  Do not shave 48 hours prior to surgery.  Men may shave face and neck.  Do not bring valuables to the hospital.  [facility] is not [facility] for any belongings or valuables.  Contacts, dentures or bridgework may not be worn into surgery.  Leave your suitcase in the car.  After surgery it may be brought to your room.  For patients admitted to the hospital, discharge time will be determined by your treatment team.  Patients discharged the day of surgery will not be allowed to drive home.    - Preparing for Surgery  Before surgery, you can play an important role.  Because skin is not sterile, your skin needs to be as free of germs as possible.  You can reduce the number of germs on you skin by washing with CHG (chlorahexidine gluconate) soap before surgery.  CHG is an antiseptic cleaner which kills germs and bonds with the skin to continue killing germs even after washing.  Please DO NOT use if you have an allergy to CHG or antibacterial soaps.  If your skin becomes reddened/irritated stop using the CHG and inform your nurse when you arrive at Short Stay.  Do not shave (including legs and underarms) for at least 48 hours prior to the first CHG shower.  You may shave your  face.  Please follow these instructions carefully:   1.  Shower with CHG Soap the night before surgery and the morning of Surgery.  2.  If you choose to wash your hair, wash your hair first as usual with your normal shampoo.  3.  After you shampoo, rinse your hair and body thoroughly to remove the shampoo.  4.  Use CHG as you would any other liquid soap.  You can apply CHG directly to the skin and wash gently with scrungie or a clean washcloth.  5.  Apply the CHG Soap to your body ONLY FROM THE NECK DOWN.  Do not use on open wounds or open sores.  Avoid contact with your eyes, ears, mouth and genitals (private parts).  Wash genitals (private parts) with your normal soap.  6.  Wash thoroughly, paying special attention to the area where your surgery will be performed.  7.  Thoroughly rinse your body with warm water from the neck down.  8.  DO NOT shower/wash with your normal soap after using and rinsing off the CHG Soap.  9.  Pat yourself dry with a clean towel.            10.  Wear clean pajamas.            11.  Place clean sheets on your bed the night of your first shower and do not sleep with pets.  Day of Surgery  Do not apply any lotions the morning of surgery.  Please wear clean clothes to the hospital/surgery center.

## 2016-07-23 ENCOUNTER — Encounter (HOSPITAL_COMMUNITY)
Admission: RE | Admit: 2016-07-23 | Discharge: 2016-07-23 | Disposition: A | Discharge status: Home / Self Care | Payer: BC Managed Care – PPO | Source: Ambulatory Visit | Attending: Orthopaedic Surgery | Admitting: Orthopaedic Surgery

## 2016-07-25 ENCOUNTER — Encounter (HOSPITAL_COMMUNITY)
Admission: RE | Admit: 2016-07-25 | Discharge: 2016-07-25 | Disposition: A | Discharge status: Home / Self Care | Payer: BC Managed Care – PPO | Source: Ambulatory Visit | Attending: Orthopaedic Surgery | Admitting: Orthopaedic Surgery

## 2016-07-25 ENCOUNTER — Ambulatory Visit (HOSPITAL_COMMUNITY)
Admission: RE | Admit: 2016-07-25 | Discharge: 2016-07-25 | Disposition: A | Discharge status: Home / Self Care | Payer: BC Managed Care – PPO | Source: Ambulatory Visit | Attending: Orthopaedic Surgery | Admitting: Orthopaedic Surgery

## 2016-07-25 ENCOUNTER — Other Ambulatory Visit (HOSPITAL_COMMUNITY): Payer: Self-pay | Admitting: *Deleted

## 2016-07-25 ENCOUNTER — Encounter (HOSPITAL_COMMUNITY): Payer: Self-pay

## 2016-07-25 DIAGNOSIS — Z01812 Encounter for preprocedural laboratory examination: Secondary | ICD-10-CM | POA: Diagnosis present

## 2016-07-25 DIAGNOSIS — Z01810 Encounter for preprocedural cardiovascular examination: Secondary | ICD-10-CM | POA: Diagnosis present

## 2016-07-25 DIAGNOSIS — Z01818 Encounter for other preprocedural examination: Secondary | ICD-10-CM

## 2016-07-25 HISTORY — DX: Restless legs syndrome: G25.81

## 2016-07-25 HISTORY — DX: Pneumonia, unspecified organism: J18.9

## 2016-07-25 HISTORY — DX: Irritable bowel syndrome, unspecified: K58.9

## 2016-07-25 LAB — BASIC METABOLIC PANEL
Anion gap: 9 (ref 5–15)
BUN: 6 mg/dL (ref 6–20)
CO2: 23 mmol/L (ref 22–32)
Calcium: 9.3 mg/dL (ref 8.9–10.3)
Chloride: 106 mmol/L (ref 101–111)
Creatinine, Ser: 0.64 mg/dL (ref 0.44–1.00)
GFR calc Af Amer: >60 mL/min (ref >60)
GFR calc non Af Amer: >60 mL/min (ref >60)
Glucose, Bld: 86 mg/dL (ref 65–99)
Potassium: 4 mmol/L (ref 3.5–5.1)
Sodium: 138 mmol/L (ref 135–145)

## 2016-07-25 LAB — TYPE AND SCREEN
ABO/RH(D): B POS
Antibody Screen: NEGATIVE

## 2016-07-25 LAB — URINALYSIS, ROUTINE W REFLEX MICROSCOPIC
Bilirubin Urine: NEGATIVE
Glucose, UA: NEGATIVE mg/dL
Hgb urine dipstick: NEGATIVE
Ketones, ur: NEGATIVE mg/dL
Leukocytes, UA: NEGATIVE
Nitrite: NEGATIVE
Protein, ur: NEGATIVE mg/dL
Specific Gravity, Urine: 1.023 (ref 1.005–1.030)
pH: 5 (ref 5.0–8.0)

## 2016-07-25 LAB — APTT: aPTT: 27 seconds (ref 24–36)

## 2016-07-25 LAB — SURGICAL PCR SCREEN
MRSA, PCR: NEGATIVE
Staphylococcus aureus: NEGATIVE

## 2016-07-25 LAB — CBC WITH DIFFERENTIAL/PLATELET
Basophils Absolute: 0 10*3/uL (ref 0.0–0.1)
Basophils Relative: 0 %
Eosinophils Absolute: 0.1 10*3/uL (ref 0.0–0.7)
Eosinophils Relative: 2 %
HCT: 39.2 % (ref 36.0–46.0)
Hemoglobin: 13.3 g/dL (ref 12.0–15.0)
Lymphocytes Relative: 42 %
Lymphs Abs: 3.1 10*3/uL (ref 0.7–4.0)
MCH: 28.1 pg (ref 26.0–34.0)
MCHC: 33.9 g/dL (ref 30.0–36.0)
MCV: 82.9 fL (ref 78.0–100.0)
Monocytes Absolute: 0.4 10*3/uL (ref 0.1–1.0)
Monocytes Relative: 5 %
Neutro Abs: 3.7 10*3/uL (ref 1.7–7.7)
Neutrophils Relative %: 51 %
Platelets: 298 10*3/uL (ref 150–400)
RBC: 4.73 MIL/uL (ref 3.87–5.11)
RDW: 13.9 % (ref 11.5–15.5)
WBC: 7.3 10*3/uL (ref 4.0–10.5)

## 2016-07-25 LAB — PROTIME-INR
INR: 0.95
Prothrombin Time: 12.6 seconds (ref 11.4–15.2)

## 2016-07-25 NOTE — Pre-Procedure Instructions (Signed)
Karen Hicks  07/25/2016     Your procedure is scheduled on Tuesday, July 31, 2016 at 12:55 PM.   [facility] at 11:00 AM.   Call this number if you have problems the morning of surgery: [phone]   Questions prior to day of surgery, please call [phone] between 8 & 4 PM.   Remember:  Do not eat food or drink liquids after midnight Monday, 07/30/16.  Take these medicines the morning of surgery with A SIP OF WATER: Esomeprazole (Nexium) - if needed, Loratadine (Claritin) - if needed   Do not wear jewelry, make-up or nail polish.  Do not wear lotions, powders, or perfumes.  Do not shave 48 hours prior to surgery.    Do not bring valuables to the hospital.  [facility] not [facility] any belongings or valuables.  Contacts, dentures or bridgework may not be worn into surgery.  Leave your suitcase in the car.  After surgery it may be brought to your room.  For patients admitted to the hospital, discharge time will be determined by your treatment team.  Special instructions:  [facility] - Preparing for Surgery  Before surgery, you can play an important role.  Because skin is not sterile, your skin needs to be as free of germs as possible.  You can reduce the number of germs on you skin by washing with CHG (chlorahexidine gluconate) soap before surgery.  CHG is an antiseptic cleaner which kills germs and bonds with the skin to continue killing germs even after washing.  Please DO NOT use if you have an allergy to CHG or antibacterial soaps.  If your skin becomes reddened/irritated stop using the CHG and inform your nurse when you arrive at Short Stay.  Do not shave (including legs and underarms) for at least 48 hours prior to the first CHG shower.  You may shave your face.  Please follow these instructions carefully:   1.  Shower with CHG Soap the night before surgery and the                    morning of  Surgery.  2.  If you choose to wash your hair, wash your hair first as usual with your       normal shampoo.  3.  After you shampoo, rinse your hair and body thoroughly to remove the shampoo.  4.  Use CHG as you would any other liquid soap.  You can apply chg directly       to the skin and wash gently with scrungie or a clean washcloth.  5.  Apply the CHG Soap to your body ONLY FROM THE NECK DOWN.        Do not use on open wounds or open sores.  Avoid contact with your eyes, ears, mouth and genitals (private parts).  Wash genitals (private parts) with your normal soap.  6.  Wash thoroughly, paying special attention to the area where your surgery        will be performed.  7.  Thoroughly rinse your body with warm water from the neck down.  8.  DO NOT shower/wash with your normal soap after using and rinsing off       the CHG Soap.  9.  Pat yourself dry with a clean towel.            10.  Wear clean pajamas.  11.  Place clean sheets on your bed the night of your first shower and do not        sleep with pets.  Day of Surgery  Do not apply any lotions the morning of surgery.  Please wear clean clothes to the hospital.   Please read over the fact sheets that you were given.

## 2016-07-30 NOTE — H&P (Signed)
TOTAL KNEE ADMISSION H&P  Patient is being admitted for right total knee arthroplasty.  Subjective:  Chief Complaint:right knee pain.  HPI: Karen Hicks, 45 y.o. female, has a history of pain and functional disability in the right knee due to arthritis and has failed non-surgical conservative treatments for greater than 12 weeks to includeNSAID's and/or analgesics, corticosteriod injections, viscosupplementation injections, flexibility and strengthening excercises, supervised PT with diminished ADL's post treatment, use of assistive devices, weight reduction as appropriate and activity modification.  Onset of symptoms was gradual, starting 5 years ago with gradually worsening course since that time. The patient noted prior procedures on the knee to include  arthroscopy on the right knee(s).  Patient currently rates pain in the right knee(s) at 10 out of 10 with activity. Patient has night pain, worsening of pain with activity and weight bearing, pain that interferes with activities of daily living, crepitus and joint swelling.  Patient has evidence of subchondral cysts, subchondral sclerosis, periarticular osteophytes and joint space narrowing by imaging studies. There is no active infection.  Patient Active Problem List   Diagnosis Date Noted  . Status post total left knee replacement 12/14/2014  . Primary osteoarthritis of left knee 08/03/2014  . Primary osteoarthritis of knee 08/03/2014  . Arthritis 07/21/2014  . GERD (gastroesophageal reflux disease) 06/12/2011  . Sleep apnea 06/12/2011  . Obesity (BMI 30-39.9) 06/12/2011  . Allergic rhinitis due to pollen 06/12/2011  . Migraine headache 06/12/2011   Past Medical History:  Diagnosis Date  . Allergy   . Anemia    when she was young  . Arthritis   . Complication of anesthesia    takes her alittle longer to wake up  . GERD (gastroesophageal reflux disease)   . Irritable bowel   . Lactose intolerance   . Migraine   . PE  (pulmonary embolism)    in 1994 meniscus repair.  Saw Dr. Susann GivensLaLonde  . Pneumonia   . PONV (postoperative nausea and vomiting)   . Restless legs syndrome   . Sleep apnea    2007.  Uses the mask & machine when she needs it.  Dr. Susann GivensLalonde is aware    Past Surgical History:  Procedure Laterality Date  . ABDOMINAL HYSTERECTOMY  01/2006   FIBROIDS  . CESAREAN SECTION    . HARDWARE REMOVAL Left 08/03/2014   Procedure: HARDWARE REMOVAL;  Surgeon: Velna OchsPeter G Dalldorf, MD;  Location: [facility] OR;  Service: Orthopedics;  Laterality: Left;  . KNEE SURGERY Bilateral    multiple  . 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 right wrist arthroscopy  2009  . TOTAL KNEE ARTHROPLASTY Left 08/03/2014   dr Jerl Santosdalldorf  . TOTAL KNEE ARTHROPLASTY Left 08/03/2014   Procedure: LEFT TOTAL KNEE ARTHROPLASTY;  Surgeon: Velna OchsPeter G Dalldorf, MD;  Location: [facility] OR;  Service: Orthopedics;  Laterality: Left;    No prescriptions prior to admission.   Allergies  Allergen Reactions  . Mushroom Extract Complex Anaphylaxis  . Peach Flavor Anaphylaxis  . Adhesive [Tape] Hives  . Penicillins Hives and Rash    Tingling and shaking Has patient had a PCN reaction causing immediate rash, facial/tongue/throat swelling, SOB or lightheadedness with hypotension: Yes Has patient had a PCN reaction causing severe rash involving mucus membranes or skin necrosis: Yes Has patient had a PCN reaction that required hospitalization No Has patient had a PCN reaction occurring within the last 10 years: Yes If all  of the above answers are "NO", then may proceed with Cephalosporin use.     Social History  Substance Use Topics  . Smoking status: Never Smoker  . Smokeless tobacco: Never Used  . Alcohol use Yes     Comment: "with a good steak"    Family History  Problem Relation Age of Onset  . Arthritis Mother   . Diabetes Mother   . Heart disease Mother   .  Hypertension Mother   . Kidney disease Mother   . Stroke Mother   . Gallbladder disease Mother   . Glaucoma Mother   . Seizures Mother   . Migraines Mother   . Arthritis Father   . Diabetes Father   . Hypertension Father   . Kidney disease Father   . Gallbladder disease Father   . Migraines Sister   . Migraines Brother   . Healthy Son      Review of Systems  Musculoskeletal: Positive for joint pain.       Right knee  All other systems reviewed and are negative.   Objective:  Physical Exam  Constitutional: She is oriented to person, place, and time. She appears well-developed and well-nourished.  HENT:  Head: Normocephalic and atraumatic.  Eyes: Pupils are equal, round, and reactive to light.  Neck: Normal range of motion.  Cardiovascular: Normal rate and regular rhythm.   Respiratory: Effort normal.  GI: Soft.  Musculoskeletal:  Her right knee motion is about 0-110 with pain along the medial joint line and some crepitation.    Neurological: She is alert and oriented to person, place, and time.  Skin: Skin is warm and dry.  Psychiatric: She has a normal mood and affect. Her behavior is normal. Judgment and thought content normal.    Vital signs in last 24 hours:    Labs:   Estimated body mass index is 31.39 kg/m as calculated from the following:   Height as of 07/25/16: 5\' 7"  (1.702 m).   Weight as of 07/25/16: 90.9 kg (200 lb 6.4 oz).   Imaging Review Plain radiographs demonstrate severe degenerative joint disease of the right knee(s). The overall alignment isneutral. The bone quality appears to be good for age and reported activity level.  Assessment/Plan:  End stage primary arthritis, right knee   The patient history, physical examination, clinical judgment of the provider and imaging studies are consistent with end stage degenerative joint disease of the right knee(s) and total knee arthroplasty is deemed medically necessary. The treatment options  including medical management, injection therapy arthroscopy and arthroplasty were discussed at length. The risks and benefits of total knee arthroplasty were presented and reviewed. The risks due to aseptic loosening, infection, stiffness, patella tracking problems, thromboembolic complications and other imponderables were discussed. The patient acknowledged the explanation, agreed to proceed with the plan and consent was signed. Patient is being admitted for inpatient treatment for surgery, pain control, PT, OT, prophylactic antibiotics, VTE prophylaxis, progressive ambulation and ADL's and discharge planning. The patient is planning to be discharged home with home health services

## 2016-07-31 ENCOUNTER — Inpatient Hospital Stay (HOSPITAL_COMMUNITY)
Admission: RE | Admit: 2016-07-31 | Discharge: 2016-08-03 | DRG: 470 | Disposition: A | Discharge status: Home Health | Payer: BC Managed Care – PPO | Source: Ambulatory Visit | Attending: Orthopaedic Surgery | Admitting: Orthopaedic Surgery

## 2016-07-31 ENCOUNTER — Encounter (HOSPITAL_COMMUNITY): Payer: Self-pay | Admitting: *Deleted

## 2016-07-31 ENCOUNTER — Inpatient Hospital Stay (HOSPITAL_COMMUNITY): Payer: BC Managed Care – PPO | Admitting: Emergency Medicine

## 2016-07-31 ENCOUNTER — Encounter (HOSPITAL_COMMUNITY)
Admission: RE | Disposition: A | Discharge status: Home Health | Payer: Self-pay | Source: Ambulatory Visit | Attending: Orthopaedic Surgery

## 2016-07-31 ENCOUNTER — Inpatient Hospital Stay (HOSPITAL_COMMUNITY): Payer: BC Managed Care – PPO | Admitting: Anesthesiology

## 2016-07-31 DIAGNOSIS — E669 Obesity, unspecified: Secondary | ICD-10-CM | POA: Diagnosis present

## 2016-07-31 DIAGNOSIS — K219 Gastro-esophageal reflux disease without esophagitis: Secondary | ICD-10-CM | POA: Diagnosis present

## 2016-07-31 DIAGNOSIS — Z96651 Presence of right artificial knee joint: Secondary | ICD-10-CM | POA: Diagnosis present

## 2016-07-31 DIAGNOSIS — R509 Fever, unspecified: Secondary | ICD-10-CM

## 2016-07-31 DIAGNOSIS — E860 Dehydration: Secondary | ICD-10-CM | POA: Diagnosis present

## 2016-07-31 DIAGNOSIS — Z96652 Presence of left artificial knee joint: Secondary | ICD-10-CM | POA: Diagnosis present

## 2016-07-31 DIAGNOSIS — G43909 Migraine, unspecified, not intractable, without status migrainosus: Secondary | ICD-10-CM | POA: Diagnosis present

## 2016-07-31 DIAGNOSIS — G2581 Restless legs syndrome: Secondary | ICD-10-CM | POA: Diagnosis not present

## 2016-07-31 DIAGNOSIS — Z9102 Food additives allergy status: Secondary | ICD-10-CM | POA: Diagnosis not present

## 2016-07-31 DIAGNOSIS — D649 Anemia, unspecified: Secondary | ICD-10-CM | POA: Diagnosis present

## 2016-07-31 DIAGNOSIS — J301 Allergic rhinitis due to pollen: Secondary | ICD-10-CM | POA: Diagnosis present

## 2016-07-31 DIAGNOSIS — Z880 Allergy status to penicillin: Secondary | ICD-10-CM | POA: Diagnosis not present

## 2016-07-31 DIAGNOSIS — Z86711 Personal history of pulmonary embolism: Secondary | ICD-10-CM | POA: Diagnosis not present

## 2016-07-31 DIAGNOSIS — Z8701 Personal history of pneumonia (recurrent): Secondary | ICD-10-CM | POA: Diagnosis not present

## 2016-07-31 DIAGNOSIS — Z91048 Other nonmedicinal substance allergy status: Secondary | ICD-10-CM | POA: Diagnosis not present

## 2016-07-31 DIAGNOSIS — R5082 Postprocedural fever: Secondary | ICD-10-CM | POA: Diagnosis not present

## 2016-07-31 DIAGNOSIS — G4730 Sleep apnea, unspecified: Secondary | ICD-10-CM | POA: Diagnosis present

## 2016-07-31 DIAGNOSIS — Z6831 Body mass index (BMI) 31.0-31.9, adult: Secondary | ICD-10-CM

## 2016-07-31 DIAGNOSIS — M1711 Unilateral primary osteoarthritis, right knee: Principal | ICD-10-CM | POA: Diagnosis present

## 2016-07-31 DIAGNOSIS — G43109 Migraine with aura, not intractable, without status migrainosus: Secondary | ICD-10-CM

## 2016-07-31 HISTORY — PX: TOTAL KNEE ARTHROPLASTY: SHX125

## 2016-07-31 HISTORY — PX: HARDWARE REMOVAL: SHX979

## 2016-07-31 SURGERY — ARTHROPLASTY, KNEE, TOTAL
Anesthesia: Regional | Site: Knee | Laterality: Right

## 2016-07-31 MED ORDER — SCOPOLAMINE 1 MG/3DAYS TD PT72
1.0000 | MEDICATED_PATCH | TRANSDERMAL | Status: DC
  Administered 2016-07-31: 1.5 mg via TRANSDERMAL

## 2016-07-31 MED ORDER — ACETAMINOPHEN 650 MG RE SUPP: 650.0000 mg | Freq: Four times a day (QID) | RECTAL | Status: DC | PRN

## 2016-07-31 MED ORDER — PHENOL 1.4 % MT LIQD: 1.0000 | OROMUCOSAL | Status: DC | PRN

## 2016-07-31 MED ORDER — BISACODYL 5 MG PO TBEC
5.0000 mg | DELAYED_RELEASE_TABLET | Freq: Every day | ORAL | Status: DC | PRN
  Administered 2016-08-01: 5 mg via ORAL
  Filled 2016-07-31: qty 1

## 2016-07-31 MED ORDER — SODIUM CHLORIDE 0.9 % IR SOLN
Status: DC | PRN
  Administered 2016-07-31: 3000 mL

## 2016-07-31 MED ORDER — METOCLOPRAMIDE HCL 5 MG/ML IJ SOLN: 10.0000 mg | Freq: Once | INTRAMUSCULAR | Status: DC | PRN

## 2016-07-31 MED ORDER — HYDROCODONE-ACETAMINOPHEN 5-325 MG PO TABS
1.0000 | ORAL_TABLET | ORAL | Status: DC | PRN
  Administered 2016-07-31 – 2016-08-03 (×13): 2 via ORAL
  Filled 2016-07-31 (×12): qty 2

## 2016-07-31 MED ORDER — VANCOMYCIN HCL IN DEXTROSE 1-5 GM/200ML-% IV SOLN
1000.0000 mg | Freq: Two times a day (BID) | INTRAVENOUS | Status: AC
  Administered 2016-07-31: 1000 mg via INTRAVENOUS
  Filled 2016-07-31: qty 200

## 2016-07-31 MED ORDER — EMETROL 1.87-1.87-21.5 PO SOLN: 10.0000 mL | ORAL | Status: DC | PRN

## 2016-07-31 MED ORDER — BUPIVACAINE-EPINEPHRINE (PF) 0.5% -1:200000 IJ SOLN
INTRAMUSCULAR | Status: DC | PRN
  Administered 2016-07-31: 30 mL via PERINEURAL

## 2016-07-31 MED ORDER — DEXTROSE 5 % IV SOLN
500.0000 mg | Freq: Four times a day (QID) | INTRAVENOUS | Status: DC | PRN
  Filled 2016-07-31: qty 5

## 2016-07-31 MED ORDER — ALUM & MAG HYDROXIDE-SIMETH 200-200-20 MG/5ML PO SUSP: 30.0000 mL | ORAL | Status: DC | PRN

## 2016-07-31 MED ORDER — ONDANSETRON HCL 4 MG PO TABS: 4.0000 mg | ORAL_TABLET | Freq: Four times a day (QID) | ORAL | Status: DC | PRN

## 2016-07-31 MED ORDER — FENTANYL CITRATE (PF) 100 MCG/2ML IJ SOLN
INTRAMUSCULAR | Status: DC | PRN
Start: 2016-07-31 — End: 2016-07-31
  Administered 2016-07-31 (×4): 50 ug via INTRAVENOUS

## 2016-07-31 MED ORDER — VANCOMYCIN HCL IN DEXTROSE 1-5 GM/200ML-% IV SOLN
1000.0000 mg | INTRAVENOUS | Status: AC
  Administered 2016-07-31: 1000 mg via INTRAVENOUS
  Filled 2016-07-31: qty 200

## 2016-07-31 MED ORDER — PANTOPRAZOLE SODIUM 40 MG PO TBEC
40.0000 mg | DELAYED_RELEASE_TABLET | Freq: Every day | ORAL | Status: DC
  Administered 2016-08-01 – 2016-08-03 (×3): 40 mg via ORAL
  Filled 2016-07-31 (×3): qty 1

## 2016-07-31 MED ORDER — HYDROCODONE-ACETAMINOPHEN 5-325 MG PO TABS
ORAL_TABLET | ORAL | Status: AC
  Administered 2016-07-31: 2 via ORAL
  Filled 2016-07-31: qty 2

## 2016-07-31 MED ORDER — HYDROMORPHONE HCL 2 MG/ML IJ SOLN
0.5000 mg | INTRAMUSCULAR | Status: DC | PRN
  Administered 2016-08-01 – 2016-08-02 (×3): 1 mg via INTRAVENOUS
  Filled 2016-07-31 (×3): qty 1

## 2016-07-31 MED ORDER — FENTANYL CITRATE (PF) 100 MCG/2ML IJ SOLN
INTRAMUSCULAR | Status: AC
  Administered 2016-07-31: 50 ug via INTRAVENOUS
  Filled 2016-07-31: qty 2

## 2016-07-31 MED ORDER — BUPIVACAINE-EPINEPHRINE (PF) 0.25% -1:200000 IJ SOLN
INTRAMUSCULAR | Status: AC
  Filled 2016-07-31: qty 30

## 2016-07-31 MED ORDER — AMITRIPTYLINE HCL 25 MG PO TABS
25.0000 mg | ORAL_TABLET | Freq: Every day | ORAL | Status: DC
  Administered 2016-07-31 – 2016-08-02 (×3): 25 mg via ORAL
  Filled 2016-07-31 (×3): qty 1

## 2016-07-31 MED ORDER — FENTANYL CITRATE (PF) 100 MCG/2ML IJ SOLN
INTRAMUSCULAR | Status: AC
  Filled 2016-07-31: qty 2

## 2016-07-31 MED ORDER — METOCLOPRAMIDE HCL 5 MG/ML IJ SOLN: 5.0000 mg | Freq: Three times a day (TID) | INTRAMUSCULAR | Status: DC | PRN

## 2016-07-31 MED ORDER — MIDAZOLAM HCL 2 MG/2ML IJ SOLN
2.0000 mg | Freq: Once | INTRAMUSCULAR | Status: AC
  Administered 2016-07-31: 2 mg via INTRAVENOUS

## 2016-07-31 MED ORDER — METHOCARBAMOL 500 MG PO TABS
500.0000 mg | ORAL_TABLET | Freq: Four times a day (QID) | ORAL | Status: DC | PRN
  Administered 2016-08-01 – 2016-08-03 (×5): 500 mg via ORAL
  Filled 2016-07-31 (×5): qty 1

## 2016-07-31 MED ORDER — ONDANSETRON HCL 4 MG/2ML IJ SOLN
INTRAMUSCULAR | Status: DC | PRN
  Administered 2016-07-31: 4 mg via INTRAVENOUS

## 2016-07-31 MED ORDER — 0.9 % SODIUM CHLORIDE (POUR BTL) OPTIME
TOPICAL | Status: DC | PRN
  Administered 2016-07-31: 1000 mL

## 2016-07-31 MED ORDER — LIDOCAINE 2% (20 MG/ML) 5 ML SYRINGE
INTRAMUSCULAR | Status: AC
  Filled 2016-07-31: qty 5

## 2016-07-31 MED ORDER — ALUM & MAG HYDROXIDE-SIMETH 200-200-20 MG/5ML PO SUSP: 30.0000 mL | Freq: Four times a day (QID) | ORAL | Status: DC | PRN

## 2016-07-31 MED ORDER — SODIUM CHLORIDE 0.9 % IV SOLN
2000.0000 mg | Freq: Once | INTRAVENOUS | Status: DC
  Filled 2016-07-31 (×2): qty 20

## 2016-07-31 MED ORDER — DIPHENHYDRAMINE HCL 50 MG/ML IJ SOLN
INTRAMUSCULAR | Status: AC
  Filled 2016-07-31: qty 1

## 2016-07-31 MED ORDER — LACTATED RINGERS IV SOLN
INTRAVENOUS | Status: DC
  Administered 2016-07-31 (×2): via INTRAVENOUS

## 2016-07-31 MED ORDER — BUPIVACAINE-EPINEPHRINE 0.25% -1:200000 IJ SOLN
INTRAMUSCULAR | Status: DC | PRN
  Administered 2016-07-31: 20 mL

## 2016-07-31 MED ORDER — MEPERIDINE HCL 25 MG/ML IJ SOLN: 6.2500 mg | INTRAMUSCULAR | Status: DC | PRN

## 2016-07-31 MED ORDER — MIDAZOLAM HCL 2 MG/2ML IJ SOLN
INTRAMUSCULAR | Status: AC
  Filled 2016-07-31: qty 2

## 2016-07-31 MED ORDER — ACETAMINOPHEN 325 MG PO TABS
650.0000 mg | ORAL_TABLET | Freq: Four times a day (QID) | ORAL | Status: DC | PRN
Start: 2016-07-31 — End: 2016-08-03
  Administered 2016-08-02: 650 mg via ORAL
  Filled 2016-07-31: qty 2

## 2016-07-31 MED ORDER — TRANEXAMIC ACID 1000 MG/10ML IV SOLN
INTRAVENOUS | Status: DC | PRN
  Administered 2016-07-31: 2000 mg via TOPICAL

## 2016-07-31 MED ORDER — ONDANSETRON HCL 4 MG/2ML IJ SOLN: 4.0000 mg | Freq: Four times a day (QID) | INTRAMUSCULAR | Status: DC | PRN

## 2016-07-31 MED ORDER — METOCLOPRAMIDE HCL 5 MG PO TABS: 5.0000 mg | ORAL_TABLET | Freq: Three times a day (TID) | ORAL | Status: DC | PRN

## 2016-07-31 MED ORDER — APIXABAN 2.5 MG PO TABS
2.5000 mg | ORAL_TABLET | Freq: Two times a day (BID) | ORAL | Status: DC
  Administered 2016-08-01 – 2016-08-03 (×5): 2.5 mg via ORAL
  Filled 2016-07-31 (×5): qty 1

## 2016-07-31 MED ORDER — LIDOCAINE HCL (CARDIAC) 20 MG/ML IV SOLN
INTRAVENOUS | Status: DC | PRN
  Administered 2016-07-31: 80 mg via INTRAVENOUS
  Administered 2016-07-31: 20 mg via INTRAVENOUS

## 2016-07-31 MED ORDER — COLLOIDAL OATMEAL 1 % EX CREA: 1.0000 "application " | TOPICAL_CREAM | Freq: Every day | CUTANEOUS | Status: DC | PRN

## 2016-07-31 MED ORDER — BUPIVACAINE LIPOSOME 1.3 % IJ SUSP
INTRAMUSCULAR | Status: DC | PRN
  Administered 2016-07-31: 20 mL

## 2016-07-31 MED ORDER — SODIUM CHLORIDE 0.9 % IJ SOLN
INTRAMUSCULAR | Status: DC | PRN
Start: 2016-07-31 — End: 2016-07-31
  Administered 2016-07-31: 40 mL

## 2016-07-31 MED ORDER — SCOPOLAMINE 1 MG/3DAYS TD PT72
MEDICATED_PATCH | TRANSDERMAL | Status: AC
  Filled 2016-07-31: qty 1

## 2016-07-31 MED ORDER — PROPOFOL 10 MG/ML IV BOLUS
INTRAVENOUS | Status: DC | PRN
  Administered 2016-07-31: 10 mg via INTRAVENOUS
  Administered 2016-07-31 (×2): 30 mg via INTRAVENOUS
  Administered 2016-07-31 (×3): 20 mg via INTRAVENOUS
  Administered 2016-07-31: 150 mg via INTRAVENOUS

## 2016-07-31 MED ORDER — DOCUSATE SODIUM 100 MG PO CAPS
100.0000 mg | ORAL_CAPSULE | Freq: Two times a day (BID) | ORAL | Status: DC
  Administered 2016-07-31 – 2016-08-03 (×6): 100 mg via ORAL
  Filled 2016-07-31 (×6): qty 1

## 2016-07-31 MED ORDER — DIPHENHYDRAMINE HCL 50 MG/ML IJ SOLN
12.5000 mg | Freq: Once | INTRAMUSCULAR | Status: AC
  Administered 2016-07-31: 12.5 mg via INTRAVENOUS

## 2016-07-31 MED ORDER — FENTANYL CITRATE (PF) 100 MCG/2ML IJ SOLN
100.0000 ug | Freq: Once | INTRAMUSCULAR | Status: AC
  Administered 2016-07-31: 100 ug via INTRAVENOUS

## 2016-07-31 MED ORDER — SUMATRIPTAN SUCCINATE 6 MG/0.5ML ~~LOC~~ SOLN
6.0000 mg | SUBCUTANEOUS | Status: DC | PRN
  Filled 2016-07-31: qty 0.5

## 2016-07-31 MED ORDER — MENTHOL 3 MG MT LOZG: 1.0000 | LOZENGE | OROMUCOSAL | Status: DC | PRN

## 2016-07-31 MED ORDER — ZOLMITRIPTAN 5 MG NA SOLN: 1.0000 | NASAL | Status: DC | PRN

## 2016-07-31 MED ORDER — AVEENO SOOTHING BATH TREATMENT EX PACK
1.0000 | PACK | Freq: Every day | CUTANEOUS | Status: DC | PRN
  Filled 2016-07-31: qty 1

## 2016-07-31 MED ORDER — METHOCARBAMOL 500 MG PO TABS
ORAL_TABLET | ORAL | Status: AC
  Filled 2016-07-31: qty 1

## 2016-07-31 MED ORDER — BUPIVACAINE LIPOSOME 1.3 % IJ SUSP
20.0000 mL | Freq: Once | INTRAMUSCULAR | Status: DC
  Filled 2016-07-31: qty 20

## 2016-07-31 MED ORDER — CHLORHEXIDINE GLUCONATE 4 % EX LIQD: 60.0000 mL | Freq: Once | CUTANEOUS | Status: DC

## 2016-07-31 MED ORDER — BACLOFEN 10 MG PO TABS
10.0000 mg | ORAL_TABLET | Freq: Every day | ORAL | Status: DC | PRN
  Administered 2016-08-03: 10 mg via ORAL
  Filled 2016-07-31: qty 1

## 2016-07-31 MED ORDER — FENTANYL CITRATE (PF) 100 MCG/2ML IJ SOLN
25.0000 ug | INTRAMUSCULAR | Status: DC | PRN
  Administered 2016-07-31 (×3): 50 ug via INTRAVENOUS

## 2016-07-31 MED ORDER — LACTATED RINGERS IV SOLN: INTRAVENOUS | Status: DC

## 2016-07-31 MED ORDER — SUMATRIPTAN SUCCINATE 50 MG PO TABS
50.0000 mg | ORAL_TABLET | ORAL | Status: DC | PRN
  Administered 2016-08-02 – 2016-08-03 (×3): 50 mg via ORAL
  Filled 2016-07-31 (×6): qty 1

## 2016-07-31 MED ORDER — DIPHENHYDRAMINE HCL 12.5 MG/5ML PO ELIX
12.5000 mg | ORAL_SOLUTION | ORAL | Status: DC | PRN
  Administered 2016-07-31: 12.5 mg via ORAL
  Administered 2016-08-01: 25 mg via ORAL
  Administered 2016-08-01: 12.5 mg via ORAL
  Administered 2016-08-01: 25 mg via ORAL
  Filled 2016-07-31 (×4): qty 10

## 2016-07-31 SURGICAL SUPPLY — 80 items
BAG DECANTER FOR FLEXI CONT (MISCELLANEOUS) IMPLANT
BANDAGE ACE 4X5 VEL STRL LF (GAUZE/BANDAGES/DRESSINGS) ×1 IMPLANT
BANDAGE ACE 6X5 VEL STRL LF (GAUZE/BANDAGES/DRESSINGS) ×1 IMPLANT
BANDAGE ESMARK 6X9 LF (GAUZE/BANDAGES/DRESSINGS) ×1 IMPLANT
BLADE SAGITTAL 25.0X1.19X90 (BLADE) ×1 IMPLANT
BLADE SAW SGTL 13.0X1.19X90.0M (BLADE) IMPLANT
BLADE SURG ROTATE 9660 (MISCELLANEOUS) IMPLANT
BNDG CMPR 9X6 STRL LF SNTH (GAUZE/BANDAGES/DRESSINGS) ×1
BNDG CMPR MED 10X6 ELC LF (GAUZE/BANDAGES/DRESSINGS) ×1
BNDG COHESIVE 4X5 TAN STRL (GAUZE/BANDAGES/DRESSINGS) ×1 IMPLANT
BNDG ELASTIC 6X10 VLCR STRL LF (GAUZE/BANDAGES/DRESSINGS) ×2 IMPLANT
BNDG ESMARK 6X9 LF (GAUZE/BANDAGES/DRESSINGS) ×2
BNDG GAUZE ELAST 4 BULKY (GAUZE/BANDAGES/DRESSINGS) ×4 IMPLANT
BOWL SMART MIX CTS (DISPOSABLE) ×2 IMPLANT
CAP KNEE TOTAL 3 SIGMA ×1 IMPLANT
CEMENT HV SMART SET (Cement) ×4 IMPLANT
COVER SURGICAL LIGHT HANDLE (MISCELLANEOUS) ×2 IMPLANT
CUFF TOURNIQUET SINGLE 34IN LL (TOURNIQUET CUFF) ×2 IMPLANT
CUFF TOURNIQUET SINGLE 44IN (TOURNIQUET CUFF) IMPLANT
DECANTER SPIKE VIAL GLASS SM (MISCELLANEOUS) ×2 IMPLANT
DRAPE C-ARM 42X72 X-RAY (DRAPES) ×1 IMPLANT
DRAPE EXTREMITY T 121X128X90 (DRAPE) ×1 IMPLANT
DRAPE HALF SHEET 40X57 (DRAPES) ×1 IMPLANT
DRAPE INCISE IOBAN 66X45 STRL (DRAPES) ×1 IMPLANT
DRAPE ORTHO SPLIT 77X108 STRL (DRAPES)
DRAPE PROXIMA HALF (DRAPES) ×4 IMPLANT
DRAPE SURG ORHT 6 SPLT 77X108 (DRAPES) ×2 IMPLANT
DRAPE U-SHAPE 47X51 STRL (DRAPES) ×2 IMPLANT
DRSG ADAPTIC 3X8 NADH LF (GAUZE/BANDAGES/DRESSINGS) ×2 IMPLANT
DRSG EMULSION OIL 3X3 NADH (GAUZE/BANDAGES/DRESSINGS) ×1 IMPLANT
DRSG PAD ABDOMINAL 8X10 ST (GAUZE/BANDAGES/DRESSINGS) ×3 IMPLANT
DURAPREP 26ML APPLICATOR (WOUND CARE) ×3 IMPLANT
ELECT REM PT RETURN 9FT ADLT (ELECTROSURGICAL) ×2
ELECTRODE REM PT RTRN 9FT ADLT (ELECTROSURGICAL) ×1 IMPLANT
GAUZE SPONGE 4X4 12PLY STRL (GAUZE/BANDAGES/DRESSINGS) ×2 IMPLANT
GLOVE BIO SURGEON STRL SZ8 (GLOVE) ×8 IMPLANT
GLOVE BIOGEL M SZ8.5 STRL (GLOVE) ×2 IMPLANT
GLOVE BIOGEL PI IND STRL 8 (GLOVE) ×2 IMPLANT
GLOVE BIOGEL PI INDICATOR 8 (GLOVE) ×2
GOWN STRL REUS W/ TWL LRG LVL3 (GOWN DISPOSABLE) ×1 IMPLANT
GOWN STRL REUS W/ TWL XL LVL3 (GOWN DISPOSABLE) ×3 IMPLANT
GOWN STRL REUS W/TWL 2XL LVL3 (GOWN DISPOSABLE) ×1 IMPLANT
GOWN STRL REUS W/TWL LRG LVL3 (GOWN DISPOSABLE) ×2
GOWN STRL REUS W/TWL XL LVL3 (GOWN DISPOSABLE) ×6
HANDPIECE INTERPULSE COAX TIP (DISPOSABLE) ×2
HOOD PEEL AWAY FACE SHEILD DIS (HOOD) ×4 IMPLANT
IMMOBILIZER KNEE 22 UNIV (SOFTGOODS) ×2 IMPLANT
KIT BASIN OR (CUSTOM PROCEDURE TRAY) ×2 IMPLANT
KIT ROOM TURNOVER OR (KITS) ×2 IMPLANT
MANIFOLD NEPTUNE II (INSTRUMENTS) ×2 IMPLANT
NDL HYPO 21X1 ECLIPSE (NEEDLE) ×1 IMPLANT
NEEDLE 22X1 1/2 (OR ONLY) (NEEDLE) ×1 IMPLANT
NEEDLE HYPO 21X1 ECLIPSE (NEEDLE) IMPLANT
NS IRRIG 1000ML POUR BTL (IV SOLUTION) ×2 IMPLANT
PACK GENERAL/GYN (CUSTOM PROCEDURE TRAY) ×2 IMPLANT
PACK TOTAL JOINT (CUSTOM PROCEDURE TRAY) ×2 IMPLANT
PACK UNIVERSAL I (CUSTOM PROCEDURE TRAY) ×1 IMPLANT
PAD ARMBOARD 7.5X6 YLW CONV (MISCELLANEOUS) ×4 IMPLANT
PAD CAST 4YDX4 CTTN HI CHSV (CAST SUPPLIES) ×1 IMPLANT
PADDING CAST COTTON 4X4 STRL (CAST SUPPLIES)
SET HNDPC FAN SPRY TIP SCT (DISPOSABLE) ×1 IMPLANT
STAPLER VISISTAT 35W (STAPLE) ×2 IMPLANT
STOCKINETTE IMPERVIOUS 9X36 MD (GAUZE/BANDAGES/DRESSINGS) ×1 IMPLANT
STRIP CLOSURE SKIN 1/2X4 (GAUZE/BANDAGES/DRESSINGS) ×2 IMPLANT
SUCTION FRAZIER HANDLE 10FR (MISCELLANEOUS)
SUCTION TUBE FRAZIER 10FR DISP (MISCELLANEOUS) IMPLANT
SUT ETHILON 4 0 FS 1 (SUTURE) ×1 IMPLANT
SUT MNCRL AB 3-0 PS2 18 (SUTURE) ×1 IMPLANT
SUT VIC AB 0 CT1 27 (SUTURE) ×4
SUT VIC AB 0 CT1 27XBRD ANBCTR (SUTURE) ×2 IMPLANT
SUT VIC AB 2-0 CT1 27 (SUTURE) ×4
SUT VIC AB 2-0 CT1 TAPERPNT 27 (SUTURE) ×2 IMPLANT
SUT VLOC 180 0 24IN GS25 (SUTURE) ×2 IMPLANT
SYR 50ML LL SCALE MARK (SYRINGE) ×2 IMPLANT
TOWEL OR 17X24 6PK STRL BLUE (TOWEL DISPOSABLE) ×2 IMPLANT
TOWEL OR 17X26 10 PK STRL BLUE (TOWEL DISPOSABLE) ×2 IMPLANT
TRAY CATH 16FR W/PLASTIC CATH (SET/KITS/TRAYS/PACK) IMPLANT
TRAY FOLEY CATH 14FR (SET/KITS/TRAYS/PACK) IMPLANT
UPCHARGE REV TRAY MBT KNEE ×1 IMPLANT
WATER STERILE IRR 1000ML POUR (IV SOLUTION) ×2 IMPLANT

## 2016-07-31 NOTE — Anesthesia Postprocedure Evaluation (Signed)
Anesthesia Post Note  Patient: Karen Hicks  Procedure(s) Performed: Procedure(s) (LRB): TOTAL KNEE ARTHROPLASTY (Right) HARDWARE REMOVAL (Right)  Patient location during evaluation: PACU Anesthesia Type: General and Regional Level of consciousness: awake and alert and oriented Pain management: satisfactory to patient Vital Signs Assessment: post-procedure vital signs reviewed and stable Respiratory status: spontaneous breathing, nonlabored ventilation, respiratory function stable and patient connected to nasal cannula oxygen Cardiovascular status: blood pressure returned to baseline and stable Postop Assessment: patient able to bend at knees Anesthetic complications: no    Last Vitals:  Vitals:   07/31/16 1530 07/31/16 1545  BP: (!) 150/78 122/66  Pulse: 77 89  Resp: 19 19  Temp:      Last Pain:  Vitals:   07/31/16 1529  TempSrc:   PainSc: 0-No pain                 Haniya Fern A.

## 2016-07-31 NOTE — Anesthesia Procedure Notes (Signed)
Anesthesia Regional Block:  Adductor canal block  Pre-Anesthetic Checklist: ,, timeout performed, Correct Patient, Correct Site, Correct Laterality, Correct Procedure, Correct Position, site marked, Risks and benefits discussed,  Surgical consent,  Pre-op evaluation,  At surgeon's request and post-op pain management  Laterality: Right  Prep: chloraprep       Needles:  Injection technique: Single-shot  Needle Type: Echogenic Stimulator Needle     Needle Length: 9cm 9 cm Needle Gauge: 21 and 21 G  Needle insertion depth: 5 cm   Additional Needles:  Procedures: ultrasound guided (picture in chart) Adductor canal block Narrative:  Start time: 07/31/2016 12:21 PM End time: 07/31/2016 12:27 PM Injection made incrementally with aspirations every 5 mL.  Performed by: Personally  Anesthesiologist: Mal AmabileFOSTER, Reianna Batdorf  Additional Notes: Risks, benefits and alternatives of procedure discussed with procedure.Timeout performed.Relevant anatomy ID'd with US. Incremental 5ml injection with frequent aspiration.  Patient tolerated procedure well.

## 2016-07-31 NOTE — Anesthesia Procedure Notes (Signed)
Procedure Name: LMA Insertion Date/Time: 07/31/2016 1:14 PM Performed by: Fransisca KaufmannMEYER, Christian Treadway E Pre-anesthesia Checklist: Patient identified, Emergency Drugs available, Suction available and Patient being monitored Patient Re-evaluated:Patient Re-evaluated prior to inductionOxygen Delivery Method: Circle System Utilized Preoxygenation: Pre-oxygenation with 100% oxygen Intubation Type: IV induction Ventilation: Mask ventilation without difficulty LMA: LMA inserted LMA Size: 4.0 Number of attempts: 1 Placement Confirmation: positive ETCO2 and breath sounds checked- equal and bilateral Tube secured with: Tape Dental Injury: Teeth and Oropharynx as per pre-operative assessment

## 2016-07-31 NOTE — Transfer of Care (Signed)
Immediate Anesthesia Transfer of Care Note  Patient: Jamee Graham-Allen  Procedure(s) Performed: Procedure(s): TOTAL KNEE ARTHROPLASTY (Right) HARDWARE REMOVAL (Right)  Patient Location: PACU  Anesthesia Type:GA combined with regional for post-op pain  Level of Consciousness: sedated  Airway & Oxygen Therapy: Patient Spontanous Breathing and Patient connected to face mask oxygen  Post-op Assessment: Report given to RN and Post -op Vital signs reviewed and stable  Post vital signs: Reviewed and stable  Last Vitals:  Vitals:   07/31/16 1529 07/31/16 1530  BP:  (!) 150/78  Pulse:  77  Resp:  19  Temp: 36.3 C     Last Pain:  Vitals:   07/31/16 1529  TempSrc:   PainSc: 0-No pain      Patients Stated Pain Goal: 3 (07/31/16 1143)  Complications: No apparent anesthesia complications

## 2016-07-31 NOTE — Progress Notes (Signed)
Orthopedic Tech Progress Note Patient Details:  Karen Hicks 22-Aug-1971 [id number]  CPM Right Knee CPM Right Knee: On Right Knee Flexion (Degrees): 90 Right Knee Extension (Degrees): 0   Saul FordyceJennifer C Tarahji Ramthun 07/31/2016, 4:56 PM

## 2016-07-31 NOTE — Op Note (Signed)
PREOP DIAGNOSIS: DJD RIGHT KNEE POSTOP DIAGNOSIS: same PROCEDURE: RIGHT TKR and hardware removal ANESTHESIA: block and general ATTENDING SURGEON: Airel Magadan G ASSISTANT: Elodia FlorenceAndrew Nida PA  INDICATIONS FOR PROCEDURE: Karen Hicks is a 45 y.o. female who has struggled for a long time with pain due to degenerative arthritis of the right knee.  The patient has failed many conservative non-operative measures and at this point has pain which limits the ability to sleep and walk.  She has also been through previous knee surgeries. She is now offered total knee replacement.  Informed operative consent was obtained after discussion of possible risks of anesthesia, infection, neurovascular injury, DVT, and death.  The importance of the post-operative rehabilitation protocol to optimize result was stressed extensively with the patient.  SUMMARY OF FINDINGS AND PROCEDURE:  Karen Hicks was taken to the operative suite where under the above anesthesia a right knee replacement was performed.  There were advanced degenerative changes and the bone quality was excellent.  We used the DePuy LCS system and placed size standard femur, 3 MBT revision tibia, 32 mm all polyethylene patella, and a size 10 mm spacer.  We removed two staples from the tibia at the start of the case with osteotomes and pliers. Elodia FlorenceAndrew Nida PA-C assisted throughout and was invaluable to the completion of the case in that he helped retract and maintain exposure while I placed components.  He also helped close thereby minimizing OR time.  The patient was admitted for appropriate post-op care to include perioperative antibiotics and mechanical and pharmacologic measures for DVT prophylaxis.  DESCRIPTION OF PROCEDURE:  Karen Hicks was taken to the operative suite where the above anesthesia was applied.  The patient was positioned supine and prepped and draped in normal sterile fashion.  An appropriate time out was  performed.  After the administration of vancomycin pre-op antibiotic the leg was elevated and exsanguinated and a tourniquet inflated. A standard longitudinal incision was made on the anterior knee.  Dissection was carried down to the extensor mechanism.  All appropriate anti-infective measures were used including the pre-operative antibiotic, betadine impregnated drape, and closed hooded exhaust systems for each member of the surgical team.  A medial parapatellar incision was made in the extensor mechanism and the knee cap flipped and the knee flexed.  Some residual meniscal tissues were removed along with any remaining ACL/PCL tissue.  We localized two metal staples from old ACL reconstruction surgery and removed them with moderate difficulty and minimal bone loss. A guide was placed on the tibia and a flat cut was made on it's superior surface.  An intramedullary guide was placed in the femur and was utilized to make anterior and posterior cuts creating an appropriate flexion gap.  A second intramedullary guide was placed in the femur to make a distal cut properly balancing the knee with an extension gap equal to the flexion gap.  The three bones sized to the above mentioned sizes and the appropriate guides were placed and utilized.  A trial reduction was done and the knee easily came to full extension and the patella tracked well on flexion.  The trial components were removed and all bones were cleaned with pulsatile lavage and then dried thoroughly.  Cement was mixed and was pressurized onto the bones followed by placement of the aforementioned components.  Excess cement was trimmed and pressure was held on the components until the cement had hardened.  The tourniquet was deflated and a small amount of bleeding was controlled with cautery  and pressure.  The knee was irrigated thoroughly.  The extensor mechanism was re-approximated with V-loc suture in running fashion.  The knee was flexed and the repair was  solid.  The subcutaneous tissues were re-approximated with #0 and #2-0 vicryl and the skin closed with a subcuticular stitch and steristrips.  A sterile dressing was applied.  Intraoperative fluids, EBL, and tourniquet time can be obtained from anesthesia records.  DISPOSITION:  The patient was taken to recovery room in stable condition and admitted for appropriate post-op care to include peri-operative antibiotic and DVT prophylaxis with mechanical and pharmacologic measures.  Sharon Stapel G 07/31/2016, 2:52 PM

## 2016-07-31 NOTE — Interval H&P Note (Signed)
History and Physical Interval Note:  07/31/2016 11:59 AM  Karen Hicks  has presented today for surgery, with the diagnosis of RIGHT KNEE DEGENERATIVE JOINT DISEASE  The various methods of treatment have been discussed with the patient and family. After consideration of risks, benefits and other options for treatment, the patient has consented to  Procedure(s): TOTAL KNEE ARTHROPLASTY (Right) HARDWARE REMOVAL (Right) as a surgical intervention .  The patient's history has been reviewed, patient examined, no change in status, stable for surgery.  I have reviewed the patient's chart and labs.  Questions were answered to the patient's satisfaction.     Karen Hicks

## 2016-07-31 NOTE — Anesthesia Preprocedure Evaluation (Addendum)
Anesthesia Evaluation  Patient identified by MRN, date of birth, ID band Patient awake    Reviewed: Allergy & Precautions, NPO status , Patient's Chart, lab work & pertinent test results  History of Anesthesia Complications (+) PONV and history of anesthetic complications  Airway Mallampati: III  TM Distance: >3 FB Neck ROM: Full    Dental no notable dental hx. (+) Teeth Intact   Pulmonary sleep apnea and Continuous Positive Airway Pressure Ventilation , pneumonia, resolved,    Pulmonary exam normal breath sounds clear to auscultation       Cardiovascular negative cardio ROS Normal cardiovascular exam Rhythm:Regular Rate:Normal     Neuro/Psych  Headaches, Restless legs syndrome negative psych ROS   GI/Hepatic GERD  Medicated and Controlled,  Endo/Other    Renal/GU   negative genitourinary   Musculoskeletal  (+) Arthritis , OA right knee   Abdominal   Peds  Hematology  (+) anemia ,   Anesthesia Other Findings   Reproductive/Obstetrics                             Anesthesia Physical Anesthesia Plan  ASA: II  Anesthesia Plan: Regional and General   Post-op Pain Management:    Induction: Intravenous  Airway Management Planned: Oral ETT  Additional Equipment:   Intra-op Plan:   Post-operative Plan: Extubation in OR  Informed Consent: I have reviewed the patients History and Physical, chart, labs and discussed the procedure including the risks, benefits and alternatives for the proposed anesthesia with the patient or authorized representative who has indicated his/her understanding and acceptance.   Dental advisory given  Plan Discussed with: CRNA, Surgeon and Anesthesiologist  Anesthesia Plan Comments:        Anesthesia Quick Evaluation

## 2016-08-01 ENCOUNTER — Encounter (HOSPITAL_COMMUNITY): Payer: Self-pay | Admitting: Orthopaedic Surgery

## 2016-08-01 NOTE — Progress Notes (Signed)
Orthopedic Tech Progress Note Patient Details:  Karen Hicks 1970/10/28 [id number]  Patient ID: Karen GardnerIeschecia Hicks, female   DOB: 1970/10/28, 45 y.o.   MRN: [id number] Applied cpm 0-60  Trinna PostMartinez, Allesandra Huebsch J 08/01/2016, 5:46 AM

## 2016-08-01 NOTE — Evaluation (Signed)
Physical Therapy Evaluation Patient Details Name: [name] MRN: [id number] DOB: [birth year] Today's Date: 08/01/2016   History of Present Illness  Pt is a 45 year old female s/p R TKA with hardware removal and hx of L TKA with hardware removal  Clinical Impression  Pt is s/p R TKA resulting in the deficits listed below (see PT Problem List).  Pt will benefit from skilled PT to increase their independence and safety with mobility to allow discharge to the venue listed below.  Pt a little drowsy this morning however assisted with ambulating short distance.  Pt with hx of multiple knee surgeries and will likely progress well.     Follow Up Recommendations Home health PT    Equipment Recommendations  None recommended by PT    Recommendations for Other Services       Precautions / Restrictions Precautions Precautions: Fall;Knee Required Braces or Orthoses: Knee Immobilizer - Right Restrictions RLE Weight Bearing: Weight bearing as tolerated      Mobility  Bed Mobility Overal bed mobility: Needs Assistance Bed Mobility: Supine to Sit     Supine to sit: Min assist     General bed mobility comments: verbal cues for technique, assist for R LE  Transfers Overall transfer level: Needs assistance Equipment used: Rolling walker (2 wheeled) Transfers: Sit to/from Stand Sit to Stand: Min guard         General transfer comment: verbal cues for UE and LE positioning, min/guard for safety  Ambulation/Gait Ambulation/Gait assistance: Min guard Ambulation Distance (Feet): 25 Feet Assistive device: Rolling walker (2 wheeled) Gait Pattern/deviations: Step-to pattern;Decreased stance time - right;Antalgic Gait velocity: decreased   General Gait Details: verbal cues for sequence, RW positioning, step length, posture, distance limited by grogginess (pt states from benadryl)  Stairs            Wheelchair Mobility    Modified Rankin (Stroke Patients Only)        Balance                                             Pertinent Vitals/Pain Pain Assessment: 0-10 Pain Score: 6  Pain Location: R knee Pain Descriptors / Indicators: Aching;Sore Pain Intervention(s): Limited activity within patient's tolerance;Monitored during session;Repositioned    Home Living Family/patient expects to be discharged to:: Private residence Living Arrangements: Children Available Help at Discharge: Family;Available PRN/intermittently Type of Home: House Home Access: Stairs to enter Entrance Stairs-Rails: Right Entrance Stairs-Number of Steps: 3 Home Layout: One level Home Equipment: Walker - 2 wheels;Bedside commode;Crutches      Prior Function Level of Independence: Independent               Hand Dominance        Extremity/Trunk Assessment               Lower Extremity Assessment: RLE deficits/detail RLE Deficits / Details: unable to perform SLR, maintained KI       Communication   Communication: No difficulties  Cognition Arousal/Alertness: Awake/alert Behavior During Therapy: WFL for tasks assessed/performed Overall Cognitive Status: Within Functional Limits for tasks assessed                      General Comments      Exercises     Assessment/Plan    PT Assessment Patient needs continued PT services  PT Problem List  Decreased strength;Pain;Decreased range of motion;Decreased mobility          PT Treatment Interventions Functional mobility training;Stair training;Gait training;DME instruction;Therapeutic activities;Therapeutic exercise;Patient/family education    PT Goals (Current goals can be found in the Care Plan section)  Acute Rehab PT Goals PT Goal Formulation: With patient Time For Goal Achievement: 08/06/16 Potential to Achieve Goals: Good    Frequency 7X/week   Barriers to discharge        Co-evaluation               End of Session Equipment Utilized During  Treatment: Gait belt Activity Tolerance: Patient tolerated treatment well Patient left: in chair;with call bell/phone within reach           Time: 0923-0944 PT Time Calculation (min) (ACUTE ONLY): 21 min   Charges:   PT Evaluation $PT Eval Low Complexity: 1 Procedure     PT G Codes:        Shemuel Harkleroad,KATHrine E 08/01/2016, 12:38 PM Zenovia JarredKati Stevi Hollinshead, PT, DPT 08/01/2016 Pager: [phone]

## 2016-08-01 NOTE — Progress Notes (Signed)
Orthopedic Tech Progress Note Patient Details:  [name] [birth year] [id number] Put on cpm at 1820 Patient ID: Stacy GardnerIeschecia Graham-Allen, female   DOB: [birth year], 45 y.o.   MRN: [id number]   Jennye MoccasinHughes, Burnie Therien Craig 08/01/2016, 6:22 PM

## 2016-08-01 NOTE — Evaluation (Signed)
Occupational Therapy Evaluation Patient Details Name: Karen Hicks MRN: [id number] DOB: [birth year] Today's Date: 08/01/2016    History of Present Illness Pt is a 45 year old female s/p R TKA with hardware removal and hx of L TKA with hardware removal   Clinical Impression   Pt doing well and is at set up level with UB ADLs, min gaurd A - sup with ADL mobility and min A with LB ADLs. Pt will have assist at home form family and is familiar with DME and A/E from previous knee surgeries. All education completed and no further acute OT is indicated at this time    Follow Up Recommendations  No OT follow up;Supervision - Intermittent    Equipment Recommendations  None recommended by OT    Recommendations for Other Services       Precautions / Restrictions Precautions Precautions: Fall;Knee Required Braces or Orthoses: Knee Immobilizer - Right Restrictions Weight Bearing Restrictions: No RLE Weight Bearing: Weight bearing as tolerated      Mobility Bed Mobility Overal bed mobility: Needs Assistance Bed Mobility: Supine to Sit     Supine to sit: Min assist     General bed mobility comments: pt up in recliner  Transfers Overall transfer level: Needs assistance Equipment used: Rolling walker (2 wheeled) Transfers: Sit to/from Stand Sit to Stand: Min guard         General transfer comment: verbal cues for UE and LE positioning, min/guard for safety    Balance Overall balance assessment: Needs assistance   Sitting balance-Leahy Scale: Good       Standing balance-Leahy Scale: Fair                              ADL Overall ADL's : Needs assistance/impaired     Grooming: Wash/dry hands;Wash/dry face;Set up   Upper Body Bathing: Set up;Sitting   Lower Body Bathing: Minimal assistance   Upper Body Dressing : Set up;Sitting   Lower Body Dressing: Minimal assistance   Toilet Transfer: Min guard;Supervision/safety;RW;Ambulation    Toileting- Clothing Manipulation and Hygiene: Supervision/safety   Tub/ Shower Transfer: Min guard;Supervision/safety;3 in 1;Rolling walker;Ambulation   Functional mobility during ADLs: Min guard;Rolling walker General ADL Comments: Pt familair with DME and A/E from previous knee surgeries     Vision Vision Assessment?: No apparent visual deficits              Pertinent Vitals/Pain Pain Assessment: 0-10 Pain Score: 7  Pain Location: R knee Pain Descriptors / Indicators: Aching;Sore Pain Intervention(s): Limited activity within patient's tolerance;Monitored during session     Hand Dominance Right   Extremity/Trunk Assessment Upper Extremity Assessment Upper Extremity Assessment: Overall WFL for tasks assessed   Lower Extremity Assessment Lower Extremity Assessment: Defer to PT evaluation RLE Deficits / Details: unable to perform SLR, maintained KI   Cervical / Trunk Assessment Cervical / Trunk Assessment: Normal   Communication Communication Communication: No difficulties   Cognition Arousal/Alertness: Awake/alert Behavior During Therapy: WFL for tasks assessed/performed Overall Cognitive Status: Within Functional Limits for tasks assessed                     General Comments   pt very pleasant, cooperative, motivated                 Home Living Family/patient expects to be discharged to:: Private residence Living Arrangements: Children Available Help at Discharge: Family;Available PRN/intermittently Type of Home: House Home  Access: Stairs to enter Entergy CorporationEntrance Stairs-Number of Steps: 3 Entrance Stairs-Rails: Right Home Layout: One level     Bathroom Shower/Tub: Tub/shower unit;Walk-in shower   Bathroom Toilet: Standard     Home Equipment: Environmental consultantWalker - 2 wheels;Bedside commode;Crutches          Prior Functioning/Environment Level of Independence: Independent                 OT Problem List: Decreased activity tolerance;Pain   OT  Treatment/Interventions:      OT Goals(Current goals can be found in the care plan section) Acute Rehab OT Goals Patient Stated Goal: go home OT Goal Formulation: With patient  OT Frequency:     Barriers to D/C:    no barriers       Co-evaluation              End of Session Equipment Utilized During Treatment: Gait belt;Rolling walker;Other (comment) (3 in 1) CPM Right Knee CPM Right Knee: Off  Activity Tolerance: Patient tolerated treatment well Patient left: in bed;with call bell/phone within reach (sitting EOB)   Time: [id number] OT Time Calculation (min): 23 min Charges:  OT General Charges $OT Visit: 1 Procedure OT Evaluation $OT Eval Moderate Complexity: 1 Procedure OT Treatments $Therapeutic Activity: 8-22 mins G-Codes:    Galen ManilaSpencer, Misha Antonini Jeanette 08/01/2016, 2:47 PM

## 2016-08-01 NOTE — Plan of Care (Signed)
Problem: Safety: Goal: Ability to remain free from injury will improve Outcome: Progressing No fall or injury noted this shift  Problem: Pain Managment: Goal: General experience of comfort will improve Outcome: Progressing Medicated once for pain  Problem: Physical Regulation: Goal: Will remain free from infection Outcome: Progressing No s/s of infection noted  Problem: Tissue Perfusion: Goal: Risk factors for ineffective tissue perfusion will decrease Outcome: Progressing No s/s of dvt  Problem: Bowel/Gastric: Goal: Will not experience complications related to bowel motility Outcome: Progressing Denies gastric and bowel issues

## 2016-08-01 NOTE — Addendum Note (Signed)
Addendum  created 08/01/16 1326 by Mal AmabileMichael Kelsey Durflinger, MD   Anesthesia Staff edited

## 2016-08-01 NOTE — Progress Notes (Signed)
Physical Therapy Treatment Note    08/01/16 1600  PT Visit Information  Last PT Received On 08/01/16  Assistance Needed +1  History of Present Illness Pt is a 45 year old female s/p R TKA with hardware removal and hx of L TKA with hardware removal  Subjective Data  Subjective Pt performed LE exercises and very groggy this afternoon.  Pt declined ambulating again due to grogginess however reports being up to bathroom with OT earlier.  Precautions  Precautions Fall;Knee  Required Braces or Orthoses Knee Immobilizer - Right  Restrictions  RLE Weight Bearing WBAT  Pain Assessment  Pain Assessment 0-10  Pain Score 5  Pain Location R knee  Pain Descriptors / Indicators Aching;Sore  Pain Intervention(s) Limited activity within patient's tolerance;Monitored during session;Repositioned;Premedicated before session  Cognition  Arousal/Alertness Awake/alert  Behavior During Therapy [facility] HospitalWFL for tasks assessed/performed  Overall Cognitive Status Within Functional Limits for tasks assessed  Exercises  Exercises Total Joint  Total Joint Exercises  Ankle Circles/Pumps AROM;Both;10 reps  Quad Sets AROM;Right;10 reps  Short Arc [employer] Tool WorksQuad AAROM;Right;10 reps  Heel Slides AAROM;Right;10 reps  Hip ABduction/ADduction AAROM;Right;10 reps  Goniometric ROM approx 40* R knee AAROM  PT - End of Session  Activity Tolerance Patient tolerated treatment well  Patient left in chair;with call bell/phone within reach  PT - Assessment/Plan  PT Plan Current plan remains appropriate  PT Frequency (ACUTE ONLY) 7X/week  Follow Up Recommendations Home health PT  PT equipment None recommended by PT  PT Goal Progression  Progress towards PT goals Progressing toward goals  PT Time Calculation  PT Start Time (ACUTE ONLY) 1440  PT Stop Time (ACUTE ONLY) 1455  PT Time Calculation (min) (ACUTE ONLY) 15 min  PT General Charges  $$ ACUTE PT VISIT 1 Procedure  PT Treatments  $Therapeutic Exercise 8-22 mins   Zenovia JarredKati Creedon Danielski,  PT, DPT 08/01/2016 Pager: [phone]

## 2016-08-01 NOTE — Progress Notes (Signed)
Subjective: 1 Day Post-Op Procedure(s) (LRB): TOTAL KNEE ARTHROPLASTY (Right) HARDWARE REMOVAL (Right)  Activity level:  wbat Diet tolerance:  ok Voiding:  ok Patient reports pain as mild and moderate.    Objective: Vital signs in last 24 hours: Temp:  [97.4 F (36.3 C)-100 F (37.8 C)] 100 F (37.8 C) (11/29 0130) Pulse Rate:  [69-89] 74 (11/29 0130) Resp:  [10-21] 18 (11/29 0130) BP: (122-153)/(61-91) 134/63 (11/29 0130) SpO2:  [93 %-100 %] 100 % (11/29 0130) Weight:  [90.7 kg (200 lb)] 90.7 kg (200 lb) (11/28 1135)  Labs: No results for input(s): HGB in the last 72 hours. No results for input(s): WBC, RBC, HCT, PLT in the last 72 hours. No results for input(s): NA, K, CL, CO2, BUN, CREATININE, GLUCOSE, CALCIUM in the last 72 hours. No results for input(s): LABPT, INR in the last 72 hours.  Physical Exam:  Neurologically intact ABD soft Neurovascular intact Sensation intact distally Intact pulses distally Dorsiflexion/Plantar flexion intact Incision: dressing C/D/I and no drainage No cellulitis present Compartment soft  Assessment/Plan:  1 Day Post-Op Procedure(s) (LRB): TOTAL KNEE ARTHROPLASTY (Right) HARDWARE REMOVAL (Right) Advance diet Up with therapy D/C IV fluids Plan for discharge tomorrow Discharge home with home health if doing well and cleared by PT I will change dressing to aquacel tomorrow. 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 Follow up in office 2 weeks post op.  Braya Habermehl, Ginger OrganNDREW PAUL 08/01/2016, 7:52 AM

## 2016-08-02 ENCOUNTER — Inpatient Hospital Stay (HOSPITAL_COMMUNITY): Payer: BC Managed Care – PPO

## 2016-08-02 MED ORDER — SODIUM CHLORIDE 0.9 % IV BOLUS (SEPSIS)
500.0000 mL | Freq: Once | INTRAVENOUS | Status: AC
  Administered 2016-08-02: 500 mL via INTRAVENOUS

## 2016-08-02 NOTE — Progress Notes (Signed)
Orthopedic Tech Progress Note Patient Details:  Karen Hicks 1970/11/19 [id number]  CPM Right Knee CPM Right Knee: On Right Knee Flexion (Degrees): 60 Right Knee Extension (Degrees): 0 Additional Comments: Patient increased to 60-0.   Alvina ChouWilliams, Gurtha Picker C 08/02/2016, 2:16 PM

## 2016-08-02 NOTE — Progress Notes (Signed)
RN spoke with MD Orma FlamingBethune who called back to update RN of CXR results, RN told MD that patient temp increased, MD told RN to give PRN tylenol and recheck. RN Rosana BergerBelton, gave report hand off to Weyerhae[employer] CompanyN Charity. RN will continue to monitor patient.

## 2016-08-02 NOTE — Progress Notes (Signed)
PT Cancellation Note  Patient Details Name: Karen Hicks MRN: [id number] DOB: 1970-10-29   Cancelled Treatment:    Reason Eval/Treat Not Completed: Medical issues which prohibited therapy Pt has elevated BP and a headache. RN requesting that PT hold off until tomorrow.    Colin BroachSabra M. Isabell Bonafede PT, DPT  [phone]  08/02/2016, 4:56 PM

## 2016-08-02 NOTE — Progress Notes (Signed)
Rn paged On call MD about temp 100.4 oral, 99.8 axillary. Also patients Bp, Rn holding fluids until MD returns call. (PA Nida ordered 500ml bolus early this afternoon, to possibly treat dehydration, wanted rest of fluids to run at 1550ml/hr to finish 1000ml bag.

## 2016-08-02 NOTE — Discharge Instructions (Signed)
Information on my medicine - ELIQUIS® (apixaban) ° °This medication education was reviewed with me or my healthcare representative as part of my discharge preparation.  The pharmacist that spoke with me during my hospital stay was:  Irelynd Zumstein Dien, RPH ° °Why was Eliquis® prescribed for you? °Eliquis® was prescribed for you to reduce the risk of blood clots forming after orthopedic surgery.   ° °What do You need to know about Eliquis®? °Take your Eliquis® TWICE DAILY - one tablet in the morning and one tablet in the evening with or without food.  It would be best to take the dose about the same time each day. ° °If you have difficulty swallowing the tablet whole please discuss with your pharmacist how to take the medication safely. ° °Take Eliquis® exactly as prescribed by your doctor and DO NOT stop taking Eliquis® without talking to the doctor who prescribed the medication.  Stopping without other medication to take the place of Eliquis® may increase your risk of developing a clot. ° °After discharge, you should have regular check-up appointments with your healthcare provider that is prescribing your Eliquis®. ° °What do you do if you miss a dose? °If a dose of ELIQUIS® is not taken at the scheduled time, take it as soon as possible on the same day and twice-daily administration should be resumed.  The dose should not be doubled to make up for a missed dose.  Do not take more than one tablet of ELIQUIS at the same time. ° °Important Safety Information °A possible side effect of Eliquis® is bleeding. You should call your healthcare provider right away if you experience any of the following: °? Bleeding from an injury or your nose that does not stop. °? Unusual colored urine (red or dark brown) or unusual colored stools (red or black). °? Unusual bruising for unknown reasons. °? A serious fall or if you hit your head (even if there is no bleeding). ° °Some medicines may interact with Eliquis® and might increase  your risk of bleeding or clotting while on Eliquis®. To help avoid this, consult your healthcare provider or pharmacist prior to using any new prescription or non-prescription medications, including herbals, vitamins, non-steroidal anti-inflammatory drugs (NSAIDs) and supplements. ° °This website has more information on Eliquis® (apixaban): http://www.eliquis.com/eliquis/home ° °

## 2016-08-02 NOTE — Progress Notes (Signed)
Orthopedic Tech Progress Note Patient Details:  [name] 1971/02/03 [id number]  Patient ID: Stacy GardnerIeschecia Graham-Allen, female   DOB: 1971/02/03, 45 y.o.   MRN: [id number] Applied cpm 0-55  Trinna PostMartinez, Desarae Placide J 08/02/2016, 5:58 AM

## 2016-08-02 NOTE — Progress Notes (Signed)
Subjective: 2 Days Post-Op Procedure(s) (LRB): TOTAL KNEE ARTHROPLASTY (Right) HARDWARE REMOVAL (Right)   [name] is having a migraine headache which she has at home. He BP is also a little elevated likely form pain and dehydration per patient.  Activity level:  wbat Diet tolerance:  ok Voiding:  ok Patient reports pain as mild and moderate.    Objective: Vital signs in last 24 hours: Temp:  [98.2 F (36.8 C)-100.2 F (37.9 C)] 98.2 F (36.8 C) (11/30 1337) Pulse Rate:  [88-99] 88 (11/30 1337) Resp:  [17] 17 (11/30 0509) BP: (152-185)/(70-79) 171/79 (11/30 1303) SpO2:  [96 %-100 %] 100 % (11/30 1303)  Labs: No results for input(s): HGB in the last 72 hours. No results for input(s): WBC, RBC, HCT, PLT in the last 72 hours. No results for input(s): NA, K, CL, CO2, BUN, CREATININE, GLUCOSE, CALCIUM in the last 72 hours. No results for input(s): LABPT, INR in the last 72 hours.  Physical Exam:  Neurologically intact ABD soft Neurovascular intact Sensation intact distally Intact pulses distally Dorsiflexion/Plantar flexion intact Incision: dressing C/D/I and no drainage No cellulitis present Compartment soft  Assessment/Plan:  2 Days Post-Op Procedure(s) (LRB): TOTAL KNEE ARTHROPLASTY (Right) HARDWARE REMOVAL (Right) Advance diet Up with therapy Plan for discharge tomorrow Discharge home with home health if doing well and cleared by PT. Follow up in office 2 weeks post op. We will give 500cc bolus then finish off running fluid bag at 750ml/hr. We will also give her the home dose of imitrex that she has ordered. I changed her dressing to aquacel today. Continue on Eliquis for DVT prevention with her history of PE.  Karen Hicks, Karen Hicks 08/02/2016, 4:22 PM

## 2016-08-02 NOTE — Care Management Note (Signed)
Case Management Note  Patient Details  Name: Taylah Graham-Allen MRN: [id number] Gardner7070 Date of Birth: 1971/04/29  Subjective/Objective:   45 yr old female s/p right total knee arthroplasty.                  Action/Plan: Case manager spoke with patient concerning Home Health and DME needs. Choice was offered for Home Health Agency, patient states she has used [facility] for her previous surgeries and will use them now. CM called referral to Janeice RobinsonKaren Nusbaumm, [facility] Liaison. Patient has rolling walker, 3in1, crutches, and CPM at home.   Expected Discharge Date:   08/03/16               Expected Discharge Plan:  Home w Home Health Services  In-House Referral:  NA  Discharge planning Services  CM Consult  Post Acute Care Choice:  NA Choice offered to:  Patient  DME Arranged:  N/A DME Agency:     HH Arranged:  PT HH Agency:  [facility]  Status of Service:  Completed, signed off  If discussed at Long Length of Stay Meetings, dates discussed:    Additional Comments:  Durenda GuthrieBrady, Catelin Manthe Naomi, RN 08/02/2016, 11:14 AM

## 2016-08-03 MED ORDER — APIXABAN 2.5 MG PO TABS: 2.5000 mg | ORAL_TABLET | Freq: Two times a day (BID) | ORAL | 0 refills | Status: DC

## 2016-08-03 MED ORDER — HYDROCODONE-ACETAMINOPHEN 5-325 MG PO TABS: 1.0000 | ORAL_TABLET | ORAL | 0 refills | Status: DC | PRN

## 2016-08-03 MED ORDER — METHOCARBAMOL 500 MG PO TABS: 500.0000 mg | ORAL_TABLET | Freq: Four times a day (QID) | ORAL | 0 refills | Status: DC | PRN

## 2016-08-03 NOTE — Progress Notes (Signed)
Pt discharge education and instructions completed. Pt IV removed and handed her prescriptions for Eliquis, Vicodin and Robaxin. Pt discharge home with family to transport her home. Right knee incision dsg remains clean, dry and intact with not stain or active bleeding noted. Pt transported off unit via wheelchair with belongings and family to the side. Dionne BucyP. Amo Anu Stagner RN

## 2016-08-03 NOTE — Discharge Summary (Signed)
Patient ID: Karen Hicks MRN: [id number] DOB/AGE: 1971/02/27 45 y.o.  Admit date: 07/31/2016 Discharge date: 08/03/2016  Admission Diagnoses:  Principal Problem:   Primary localized osteoarthritis of right knee Active Problems:   Primary osteoarthritis of right knee   Discharge Diagnoses:  Same  Past Medical History:  Diagnosis Date  . Allergy   . Anemia    when she was young  . Arthritis   . Complication of anesthesia    takes her alittle longer to wake up  . GERD (gastroesophageal reflux disease)   . Irritable bowel   . Lactose intolerance   . Migraine   . PE (pulmonary embolism)    in 1994 meniscus repair.  Saw Dr. Susann Givens  . Pneumonia   . PONV (postoperative nausea and vomiting)   . Restless legs syndrome   . Sleep apnea    2007.  Uses the mask & machine when she needs it.  Dr. Susann Givens is aware    Surgeries: Procedure(s): TOTAL KNEE ARTHROPLASTY HARDWARE REMOVAL on 07/31/2016   Consultants:   Discharged Condition: Improved  Hospital Course: Karen Hicks is an 45 y.o. female who was admitted 07/31/2016 for operative treatment ofPrimary localized osteoarthritis of right knee. Patient has severe unremitting pain that affects sleep, daily activities, and work/hobbies. After pre-op clearance the patient was taken to the operating room on 07/31/2016 and underwent  Procedure(s): TOTAL KNEE ARTHROPLASTY HARDWARE REMOVAL.    Patient was given perioperative antibiotics: Anti-infectives    Start     Dose/Rate Route Frequency Ordered Stop   07/31/16 2330  vancomycin (VANCOCIN) IVPB 1000 mg/200 mL premix     1,000 mg 200 mL/hr over 60 Minutes Intravenous Every 12 hours 07/31/16 1816 08/01/16 0048   07/31/16 1130  vancomycin (VANCOCIN) IVPB 1000 mg/200 mL premix     1,000 mg 200 mL/hr over 60 Minutes Intravenous On call to O.R. 07/31/16 1130 07/31/16 1345       Patient was given sequential compression devices, early ambulation, and  chemoprophylaxis to prevent DVT.  Patient benefited maximally from hospital stay and there were no complications.    Recent vital signs: Patient Vitals for the past 24 hrs:  BP Temp Temp src Pulse Resp SpO2 Height  08/03/16 0606 (!) 145/74 99 F (37.2 C) Oral 91 17 96 % -  08/03/16 0448 - - - - - - 5\' 7"  (1.702 m)  08/03/16 0035 (!) 152/79 - - - - - -  08/02/16 2308 - 99.4 F (37.4 C) Oral - - - -  08/02/16 2202 - 99 F (37.2 C) Axillary - - - -  08/02/16 2200 - 100.3 F (37.9 C) Oral - - - -  08/02/16 1948 - 99.6 F (37.6 C) Axillary - - - -  08/02/16 1900 (!) 162/71 (!) 101.9 F (38.8 C) Oral 92 17 98 % -  08/02/16 1856 - 99.8 F (37.7 C) Axillary - - - -  08/02/16 1841 (!) 162/74 (!) 100.4 F (38 C) Oral (!) 102 - 99 % -  08/02/16 1337 - 98.2 F (36.8 C) - 88 - - -  08/02/16 1303 (!) 171/79 - - 88 - 100 % -  08/02/16 1301 (!) 165/79 - - 92 - - -  08/02/16 1250 (!) 185/79 - - - - - -     Recent laboratory studies: No results for input(s): WBC, HGB, HCT, PLT, NA, K, CL, CO2, BUN, CREATININE, GLUCOSE, INR, CALCIUM in the last 72 hours.  Invalid input(s): PT, 2  Discharge Medications:     Medication List    TAKE these medications   alum & mag hydroxide-simeth 200-200-20 MG/5ML suspension Commonly known as:  MAALOX/MYLANTA Take 30 mLs by mouth every 6 (six) hours as needed for indigestion or heartburn.   anti-nausea solution Take 10 mLs by mouth every 15 (fifteen) minutes as needed for nausea or vomiting.   apixaban 2.5 MG Tabs tablet Commonly known as:  ELIQUIS Take 1 tablet (2.5 mg total) by mouth every 12 (twelve) hours.   baclofen 10 MG tablet Commonly known as:  LIORESAL Take 1 tablet (10 mg total) by mouth 3 (three) times daily. What changed:  when to take this  reasons to take this   diphenhydrAMINE 25 mg capsule Commonly known as:  BENADRYL Take 25 mg by mouth daily as needed for allergies.   esomeprazole 20 MG capsule Commonly known as:   NEXIUM Take 1 capsule (20 mg total) by mouth daily before breakfast. What changed:  when to take this  reasons to take this   HYDROcodone-acetaminophen 5-325 MG tablet Commonly known as:  NORCO/VICODIN Take 1-2 tablets by mouth every 4 (four) hours as needed (breakthrough pain).   loratadine 10 MG tablet Commonly known as:  CLARITIN Take 10 mg by mouth daily as needed for allergies.   methocarbamol 500 MG tablet Commonly known as:  ROBAXIN Take 1 tablet (500 mg total) by mouth every 6 (six) hours as needed for muscle spasms.   NEOSPORIN ECZEMA ESSENTIALS 1 % Crea Generic drug:  Colloidal Oatmeal Apply 1 application topically daily as needed (for eczema).   ondansetron 4 MG disintegrating tablet Commonly known as:  ZOFRAN ODT Take 1 tablet (4 mg total) by mouth every 8 (eight) hours as needed for nausea or vomiting.   VISINE OP Apply 1 drop to eye 2 (two) times daily as needed (allergies).   ZOLMitriptan 2.5 MG tablet Commonly known as:  ZOMIG Take 2.5 mg by mouth as needed for migraine or headache. May repeat in 2 hours if headache persists or recurs.   zolmitriptan 5 MG nasal solution Commonly known as:  ZOMIG Place 1 spray into the nose as needed for migraine.            Durable Medical Equipment        Start     Ordered   07/31/16 1817  DME Walker rolling  Once    Question:  Patient needs a walker to treat with the following condition  Answer:  Primary osteoarthritis of right knee   07/31/16 1816   07/31/16 1817  DME 3 n 1  Once     07/31/16 1816   07/31/16 1817  DME Bedside commode  Once    Question:  Patient needs a bedside commode to treat with the following condition  Answer:  Primary osteoarthritis of right knee   07/31/16 1816      Diagnostic Studies: Dg Chest 2 View  Result Date: 07/25/2016 CLINICAL DATA:  Preop for total knee arthroplasty no chest complaint EXAM: CHEST  2 VIEW COMPARISON:  07/22/2014 FINDINGS: The heart size and mediastinal  contours are within normal limits. Both lungs are clear. The visualized skeletal structures are unremarkable. IMPRESSION: No active cardiopulmonary disease. Electronically Signed   By: Jasmine Pang M.D.   On: 07/25/2016 15:52   Dg Chest Port 1 View  Result Date: 08/02/2016 CLINICAL DATA:  Postoperative fever.  Substernal chest pain. EXAM: PORTABLE CHEST 1 VIEW COMPARISON:  None. FINDINGS: The heart size and  mediastinal contours are within normal limits. Both lungs are clear. The visualized skeletal structures are unremarkable. IMPRESSION: No active disease. Electronically Signed   By: Annia Belt M.D.   On: 08/02/2016 19:34    Disposition: 01-Home or Self Care  Discharge Instructions    Call MD / Call 911    Complete by:  As directed    If you experience chest pain or shortness of breath, CALL 911 and be transported to the hospital emergency room.  If you develope a fever above 101 F, pus (white drainage) or increased drainage or redness at the wound, or calf pain, call your surgeon's office.   Constipation Prevention    Complete by:  As directed    Drink plenty of fluids.  Prune juice may be helpful.  You may use a stool softener, such as Colace (over the counter) 100 mg twice a day.  Use MiraLax (over the counter) for constipation as needed.   Diet - low sodium heart healthy    Complete by:  As directed    Discharge instructions    Complete by:  As directed    INSTRUCTIONS AFTER JOINT REPLACEMENT   Remove items at home which could result in a fall. This includes throw rugs or furniture in walking pathways ICE to the affected joint every three hours while awake for 30 minutes at a time, for at least the first 3-5 days, and then as needed for pain and swelling.  Continue to use ice for pain and swelling. You may notice swelling that will progress down to the foot and ankle.  This is normal after surgery.  Elevate your leg when you are not up walking on it.   Continue to use the breathing  machine you got in the hospital (incentive spirometer) which will help keep your temperature down.  It is common for your temperature to cycle up and down following surgery, especially at night when you are not up moving around and exerting yourself.  The breathing machine keeps your lungs expanded and your temperature down.   DIET:  As you were doing prior to hospitalization, we recommend a well-balanced diet.  DRESSING / WOUND CARE / SHOWERING  You may shower 3 days after surgery, but keep the wounds dry during showering.  You may use an occlusive plastic wrap (Press'n Seal for example), NO SOAKING/SUBMERGING IN THE BATHTUB.  If the bandage gets wet, change with a clean dry gauze.  If the incision gets wet, pat the wound dry with a clean towel.  ACTIVITY  Increase activity slowly as tolerated, but follow the weight bearing instructions below.   No driving for 6 weeks or until further direction given by your physician.  You cannot drive while taking narcotics.  No lifting or carrying greater than 10 lbs. until further directed by your surgeon. Avoid periods of inactivity such as sitting longer than an hour when not asleep. This helps prevent blood clots.  You may return to work once you are authorized by your doctor.     WEIGHT BEARING   Weight bearing as tolerated with assist device (walker, cane, etc) as directed, use it as long as suggested by your surgeon or therapist, typically at least 4-6 weeks.   EXERCISES  Results after joint replacement surgery are often greatly improved when you follow the exercise, range of motion and muscle strengthening exercises prescribed by your doctor. Safety measures are also important to protect the joint from further injury. Any time any of these  exercises cause you to have increased pain or swelling, decrease what you are doing until you are comfortable again and then slowly increase them. If you have problems or questions, call your caregiver or  physical therapist for advice.   Rehabilitation is important following a joint replacement. After just a few days of immobilization, the muscles of the leg can become weakened and shrink (atrophy).  These exercises are designed to build up the tone and strength of the thigh and leg muscles and to improve motion. Often times heat used for twenty to thirty minutes before working out will loosen up your tissues and help with improving the range of motion but do not use heat for the first two weeks following surgery (sometimes heat can increase post-operative swelling).   These exercises can be done on a training (exercise) mat, on the floor, on a table or on a bed. Use whatever works the best and is most comfortable for you.    Use music or television while you are exercising so that the exercises are a pleasant break in your day. This will make your life better with the exercises acting as a break in your routine that you can look forward to.   Perform all exercises about fifteen times, three times per day or as directed.  You should exercise both the operative leg and the other leg as well.   Exercises include:   Quad Sets - Tighten up the muscle on the front of the thigh (Quad) and hold for 5-10 seconds.   Straight Leg Raises - With your knee straight (if you were given a brace, keep it on), lift the leg to 60 degrees, hold for 3 seconds, and slowly lower the leg.  Perform this exercise against resistance later as your leg gets stronger.  Leg Slides: Lying on your back, slowly slide your foot toward your buttocks, bending your knee up off the floor (only go as far as is comfortable). Then slowly slide your foot back down until your leg is flat on the floor again.  Angel Wings: Lying on your back spread your legs to the side as far apart as you can without causing discomfort.  Hamstring Strength:  Lying on your back, push your heel against the floor with your leg straight by tightening up the muscles of  your buttocks.  Repeat, but this time bend your knee to a comfortable angle, and push your heel against the floor.  You may put a pillow under the heel to make it more comfortable if necessary.   A rehabilitation program following joint replacement surgery can speed recovery and prevent re-injury in the future due to weakened muscles. Contact your doctor or a physical therapist for more information on knee rehabilitation.    CONSTIPATION  Constipation is defined medically as fewer than three stools per week and severe constipation as less than one stool per week.  Even if you have a regular bowel pattern at home, your normal regimen is likely to be disrupted due to multiple reasons following surgery.  Combination of anesthesia, postoperative narcotics, change in appetite and fluid intake all can affect your bowels.   YOU MUST use at least one of the following options; they are listed in order of increasing strength to get the job done.  They are all available over the counter, and you may need to use some, POSSIBLY even all of these options:    Drink plenty of fluids (prune juice may be helpful) and high fiber  foods Colace 100 mg by mouth twice a day  Senokot for constipation as directed and as needed Dulcolax (bisacodyl), take with full glass of water  Miralax (polyethylene glycol) once or twice a day as needed.  If you have tried all these things and are unable to have a bowel movement in the first 3-4 days after surgery call either your surgeon or your primary doctor.    If you experience loose stools or diarrhea, hold the medications until you stool forms back up.  If your symptoms do not get better within 1 week or if they get worse, check with your doctor.  If you experience "the worst abdominal pain ever" or develop nausea or vomiting, please contact the office immediately for further recommendations for treatment.   ITCHING:  If you experience itching with your medications, try taking  only a single pain pill, or even half a pain pill at a time.  You can also use Benadryl over the counter for itching or also to help with sleep.   TED HOSE STOCKINGS:  Use stockings on both legs until for at least 2 weeks or as directed by physician office. They may be removed at night for sleeping.  MEDICATIONS:  See your medication summary on the "After Visit Summary" that nursing will review with you.  You may have some home medications which will be placed on hold until you complete the course of blood thinner medication.  It is important for you to complete the blood thinner medication as prescribed.  PRECAUTIONS:  If you experience chest pain or shortness of breath - call 911 immediately for transfer to the hospital emergency department.   If you develop a fever greater that 101 F, purulent drainage from wound, increased redness or drainage from wound, foul odor from the wound/dressing, or calf pain - CONTACT YOUR SURGEON.                                                   FOLLOW-UP APPOINTMENTS:  If you do not already have a post-op appointment, please call the office for an appointment to be seen by your surgeon.  Guidelines for how soon to be seen are listed in your "After Visit Summary", but are typically between 1-4 weeks after surgery.  OTHER INSTRUCTIONS:   Knee Replacement:  Do not place pillow under knee, focus on keeping the knee straight while resting. CPM instructions: 0-90 degrees, 2 hours in the morning, 2 hours in the afternoon, and 2 hours in the evening. Place foam block, curve side up under heel at all times except when in CPM or when walking.  DO NOT modify, tear, cut, or change the foam block in any way.  MAKE SURE YOU:  Understand these instructions.  Get help right away if you are not doing well or get worse.    Thank you for letting us be a part of your medical care team.  It is a privilege we respect greatly.  We hope these instructions will help you stay on track  for a fast and full recovery!   Increase activity slowly as tolerated    Complete by:  As directed       Follow-up Information    DALLDORF,PETER G, MD. Schedule an appointment as soon as possible for a visit in 2 week(s).   Specialty:  Orthopedic  Surgery Contact information: [address] [place] [address] [phone]        [facility] Follow up.   Why:  Someone from [facility] will contact you to arrange start date and time for therapy. Contact information: [address] [place] [address] [phone]            Signed: Drema Halon 08/03/2016, 7:43 AM

## 2016-08-03 NOTE — Progress Notes (Signed)
Subjective: 3 Days Post-Op Procedure(s) (LRB): TOTAL KNEE ARTHROPLASTY (Right) HARDWARE REMOVAL (Right)   Patient feels better after fluid bolus. A CXR was done last night which was normal. Her headache is better after fluid and imitrex.  Activity level:  wbat Diet tolerance:  ok Voiding:  ok Patient reports pain as mild.    Objective: Vital signs in last 24 hours: Temp:  [98.2 F (36.8 C)-101.9 F (38.8 C)] 99 F (37.2 C) (12/01 0606) Pulse Rate:  [88-102] 91 (12/01 0606) Resp:  [17] 17 (12/01 0606) BP: (145-185)/(71-79) 145/74 (12/01 0606) SpO2:  [96 %-100 %] 96 % (12/01 0606)  Labs: No results for input(s): HGB in the last 72 hours. No results for input(s): WBC, RBC, HCT, PLT in the last 72 hours. No results for input(s): NA, K, CL, CO2, BUN, CREATININE, GLUCOSE, CALCIUM in the last 72 hours. No results for input(s): LABPT, INR in the last 72 hours.  Physical Exam:  Neurologically intact ABD soft Neurovascular intact Sensation intact distally Intact pulses distally Dorsiflexion/Plantar flexion intact Incision: dressing C/D/I and no drainage No cellulitis present Compartment soft  Assessment/Plan:  3 Days Post-Op Procedure(s) (LRB): TOTAL KNEE ARTHROPLASTY (Right) HARDWARE REMOVAL (Right) Advance diet Up with therapy Discharge home with home health this afternoon after PT if doing well and feeling good. Continue on Eliquis BID x 4 weeks for dvt prevention Follow up in office 2 weeks post op.  Marlon Vonruden, Ginger OrganNDREW PAUL 08/03/2016, 7:33 AM

## 2016-08-03 NOTE — Progress Notes (Signed)
Pt complaining of severe migraine medication given, up with PT, seems to be doing much better

## 2016-08-03 NOTE — Progress Notes (Signed)
Physical Therapy Treatment Patient Details Name: Karen Hicks MRN: [id number] DOB: 01-28-1971 Today's Date: 08/03/2016    History of Present Illness Pt is a 45 year old female s/p R TKA with hardware removal and hx of L TKA with hardware removal    PT Comments    Pt demonstrates improved tolerance for gait and improved transfers this session. Pt is able to recall all exercises in HEP and all therapy instructions. Improved knee flexion to 66 degrees noted during final therapy session. Pt to DC home.    Follow Up Recommendations  Home health PT     Equipment Recommendations  None recommended by PT    Recommendations for Other Services       Precautions / Restrictions Precautions Precautions: Fall;Knee Required Braces or Orthoses: Knee Immobilizer - Right Knee Immobilizer - Right: On when out of bed or walking Restrictions Weight Bearing Restrictions: Yes RLE Weight Bearing: Weight bearing as tolerated    Mobility  Bed Mobility Overal bed mobility: Needs Assistance Bed Mobility: Supine to Sit     Supine to sit: Supervision     General bed mobility comments: Supervision for safety  Transfers Overall transfer level: Needs assistance Equipment used: Rolling walker (2 wheeled) Transfers: Sit to/from Stand Sit to Stand: Supervision         General transfer comment: supervision for safety with verbal cues for UE positioning  Ambulation/Gait Ambulation/Gait assistance: Supervision Ambulation Distance (Feet): 250 Feet Assistive device: Rolling walker (2 wheeled) Gait Pattern/deviations: Step-to pattern;Decreased stance time - right;Decreased step length - left Gait velocity: decreased Gait velocity interpretation: Below normal speed for age/gender General Gait Details: moderate antalgic gait noted   Stairs Stairs: Yes Stairs assistance: Min guard Stair Management: Two rails;Alternating pattern;Forwards Number of Stairs: 2 General stair comments:  verbal cues for sequencing  Wheelchair Mobility    Modified Rankin (Stroke Patients Only)       Balance                                    Cognition Arousal/Alertness: Awake/alert Behavior During Therapy: WFL for tasks assessed/performed Overall Cognitive Status: Within Functional Limits for tasks assessed                      Exercises Total Joint Exercises Quad Sets: AROM;Right;10 reps Heel Slides: AAROM;Right;10 reps Goniometric ROM: 2-66    General Comments        Pertinent Vitals/Pain Pain Assessment: 0-10 Pain Score: 5  Pain Location: Right Knee Pain Descriptors / Indicators: Aching;Sore Pain Intervention(s): Monitored during session;Premedicated before session;Ice applied    Home Living                      Prior Function            PT Goals (current goals can now be found in the care plan section) Acute Rehab PT Goals Patient Stated Goal: go home Progress towards PT goals: Progressing toward goals    Frequency    7X/week      PT Plan Current plan remains appropriate    Co-evaluation             End of Session Equipment Utilized During Treatment: Gait belt Activity Tolerance: Patient tolerated treatment well Patient left: in chair;with call bell/phone within reach     Time: 1321-1340 PT Time Calculation (min) (ACUTE ONLY): 19 min  Charges:  $Gait  Training: 8-22 mins                    G Codes:      Colin BroachSabra M. Aedan Geimer PT, DPT  [phone]  08/03/2016, 1:45 PM

## 2016-08-03 NOTE — Progress Notes (Signed)
Physical Therapy Treatment Patient Details Name: Karen Hicks MRN: [id number] DOB: Feb 27, 1971 Today's Date: 08/03/2016    History of Present Illness Pt is a 45 year old female s/p R TKA with hardware removal and hx of L TKA with hardware removal    PT Comments    Pt presents POD 2 and is moving much better with therapy. Performed gait x 500' this session with KI in place and performed stair negotiation training this session. Pt with improvements in headache and no s/s of fever today. Pt will benefit from one additional PM session in order to review HEP and obtain knee measurements.    Follow Up Recommendations  Home health PT     Equipment Recommendations  None recommended by PT    Recommendations for Other Services       Precautions / Restrictions Precautions Precautions: Fall;Knee Required Braces or Orthoses: Knee Immobilizer - Right Knee Immobilizer - Right: On when out of bed or walking Restrictions Weight Bearing Restrictions: Yes RLE Weight Bearing: Weight bearing as tolerated    Mobility  Bed Mobility Overal bed mobility: Needs Assistance Bed Mobility: Supine to Sit     Supine to sit: Supervision     General bed mobility comments: Supervision for safety  Transfers Overall transfer level: Needs assistance Equipment used: Rolling walker (2 wheeled) Transfers: Sit to/from Stand Sit to Stand: Supervision         General transfer comment: supervision for safety with verbal cues for UE positioning  Ambulation/Gait Ambulation/Gait assistance: Min guard Ambulation Distance (Feet): 500 Feet Assistive device: Rolling walker (2 wheeled) Gait Pattern/deviations: Step-to pattern;Decreased stance time - right;Decreased step length - left;Antalgic Gait velocity: decreased Gait velocity interpretation: Below normal speed for age/gender General Gait Details: moderate antalgic gait noted   Stairs Stairs: Yes Stairs assistance: Min guard Stair  Management: Two rails;Alternating pattern;Forwards Number of Stairs: 2 General stair comments: verbal cues for sequencing  Wheelchair Mobility    Modified Rankin (Stroke Patients Only)       Balance                                    Cognition Arousal/Alertness: Awake/alert Behavior During Therapy: WFL for tasks assessed/performed Overall Cognitive Status: Within Functional Limits for tasks assessed                      Exercises      General Comments        Pertinent Vitals/Pain Pain Assessment: 0-10 Pain Score: 5  Pain Location: R knee Pain Descriptors / Indicators: Aching;Sore Pain Intervention(s): Monitored during session;Premedicated before session;Ice applied;Repositioned    Home Living                      Prior Function            PT Goals (current goals can now be found in the care plan section) Acute Rehab PT Goals Patient Stated Goal: go home Progress towards PT goals: Progressing toward goals    Frequency    7X/week      PT Plan Current plan remains appropriate    Co-evaluation             End of Session Equipment Utilized During Treatment: Gait belt Activity Tolerance: Patient tolerated treatment well Patient left: in chair;with call bell/phone within reach     Time: 1115-1141 PT Time Calculation (min) (ACUTE ONLY):  26 min  Charges:  $Gait Training: 23-37 mins                    G Codes:      Colin BroachSabra M. Ewing Fandino PT, DPT  [phone]  08/03/2016, 12:23 PM

## 2016-08-10 ENCOUNTER — Ambulatory Visit (HOSPITAL_COMMUNITY)
Admission: RE | Admit: 2016-08-10 | Discharge: 2016-08-10 | Disposition: A | Discharge status: Home / Self Care | Payer: BC Managed Care – PPO | Source: Ambulatory Visit | Attending: Family Medicine | Admitting: Family Medicine

## 2016-08-10 ENCOUNTER — Other Ambulatory Visit (HOSPITAL_COMMUNITY): Payer: Self-pay | Admitting: Orthopaedic Surgery

## 2016-08-10 DIAGNOSIS — M79661 Pain in right lower leg: Secondary | ICD-10-CM

## 2016-08-10 DIAGNOSIS — M25561 Pain in right knee: Secondary | ICD-10-CM | POA: Insufficient documentation

## 2016-08-10 DIAGNOSIS — M7989 Other specified soft tissue disorders: Secondary | ICD-10-CM

## 2016-08-10 NOTE — Progress Notes (Signed)
**  Preliminary report by tech**  Right lower extremity venous duplex complete. There is no evidence of deep or superficial vein thrombosis involving the right lower extremity. All visualized vessels appear patent and compressible. There is no evidence of a Baker's cyst on the right. [facility] [phone].  08/10/16 1:31 PM Olen CordialGreg Jaquelinne Glendening RVT

## 2017-02-21 ENCOUNTER — Encounter: Payer: Self-pay | Admitting: Family Medicine

## 2017-02-21 ENCOUNTER — Ambulatory Visit (INDEPENDENT_AMBULATORY_CARE_PROVIDER_SITE_OTHER): Payer: BC Managed Care – PPO | Admitting: Family Medicine

## 2017-02-21 VITALS — BP 120/72 | HR 66 | Temp 98.3°F | Wt 207.6 lb

## 2017-02-21 DIAGNOSIS — J0140 Acute pansinusitis, unspecified: Secondary | ICD-10-CM | POA: Diagnosis not present

## 2017-02-21 DIAGNOSIS — R51 Headache: Secondary | ICD-10-CM

## 2017-02-21 DIAGNOSIS — R519 Headache, unspecified: Secondary | ICD-10-CM

## 2017-02-21 MED ORDER — SULFAMETHOXAZOLE-TRIMETHOPRIM 800-160 MG PO TABS
1.0000 | ORAL_TABLET | Freq: Two times a day (BID) | ORAL | 0 refills | Status: DC
Start: 2017-02-21 — End: 2017-06-12

## 2017-02-21 MED ORDER — KETOROLAC TROMETHAMINE 60 MG/2ML IM SOLN
60.0000 mg | Freq: Once | INTRAMUSCULAR | Status: AC
  Administered 2017-02-21: 60 mg via INTRAMUSCULAR

## 2017-02-21 NOTE — Progress Notes (Signed)
Subjective: Chief Complaint  Patient presents with  . migraine    migraine- had this all week.. tried OTC and no relief. migraine comes and goes.      Karen Hicks is a 46 y.o. female who presents for a 4 day history of nasal pressure and pressure behind her eyes. States this seems to be more of a sinus issue and not her typical migraine headache. Reports associated symptoms of nasal congestion, rhinorrhea, and post nasal drainage for the past 4-5 days.   Today she stats she has a sensitivity to smells, nausea, photosensitivity and phonophobia. States her usual migraine headaches are in a different location and always go away with NAIDS and Zomeg and this time she got very little relief with these medications.   Denies fever, chills, blurred or double vision, dizziness, ear pain, sore throat, cough, abdominal pain, V/D. No numbness, tingling or weakness.   States her last migraine was in April.   Treatment to date: Claritin D, sudafed, Zomeg, Ibuprofen, Aleve.  Denies sick contacts.  No other aggravating or relieving factors.  No other c/o.  ROS as in subjective.   Objective: Vitals:   02/21/17 1134  BP: 120/72  Pulse: 66  Temp: 98.3 F (36.8 C)    General appearance: Alert, WD/WN, no distress, mildly ill appearing                             Skin: warm, no rash                           Head: + maxillary and ethmoid sinus tenderness                            Eyes: conjunctiva normal, corneas clear, PERRLA                            Ears: pearly TMs, external ear canals normal                          Nose: septum midline, turbinates swollen, with erythema and thick discharge             Mouth/throat: MMM, tongue normal, mild pharyngeal erythema                           Neck: supple, no adenopathy, no thyromegaly, nontender                      Neuro: PERRLA, EOMs intact, CN II-IX intact. Normal gait.       Assessment: Acute non-recurrent pansinusitis - Plan:  sulfamethoxazole-trimethoprim (BACTRIM DS,SEPTRA DS) 800-160 MG tablet  Acute intractable headache, unspecified headache type - Plan: ketorolac (TORADOL) injection 60 mg    Plan: Discussed diagnosis and treatment of acute sinusitis. Bactrim prescribed due to PCN allergic. This does not appear to be her typical migraine headache but she has not gotten relief with OTC NSAIDS. Toradol 60 mg injection given per patient request.  She reports good success with this in the past. Also advised her to go home to rest in a cool, dark room, hydrate and take good care of herself. She will follow up if symptoms worsen or if she is not back to baseline after completing the  antibiotic.

## 2017-06-12 ENCOUNTER — Ambulatory Visit (INDEPENDENT_AMBULATORY_CARE_PROVIDER_SITE_OTHER): Payer: BC Managed Care – PPO | Admitting: Family Medicine

## 2017-06-12 ENCOUNTER — Encounter: Payer: Self-pay | Admitting: Family Medicine

## 2017-06-12 VITALS — BP 132/84 | HR 76 | Temp 98.4°F | Wt 209.4 lb

## 2017-06-12 DIAGNOSIS — H1131 Conjunctival hemorrhage, right eye: Secondary | ICD-10-CM | POA: Diagnosis not present

## 2017-06-12 DIAGNOSIS — R030 Elevated blood-pressure reading, without diagnosis of hypertension: Secondary | ICD-10-CM | POA: Diagnosis not present

## 2017-06-12 DIAGNOSIS — R51 Headache: Secondary | ICD-10-CM | POA: Diagnosis not present

## 2017-06-12 DIAGNOSIS — R519 Headache, unspecified: Secondary | ICD-10-CM

## 2017-06-12 NOTE — Progress Notes (Signed)
   Subjective:    Patient ID: Karen Hicks, female    DOB: 1971-02-02, 46 y.o.   MRN: [id number]  HPI Chief Complaint  Patient presents with  . blood pressure    saw ortho this morning and they saw her eye so sent to eye doctor, her bp was elevated there but she was given drops for eye and told to let it heal on its on bp 147/109.    She is here due to her BP being elevated at her ophthalmologist earlier today. She was evaluated at [facility] for a conjunctival hemorrhage.   States her BP is checked every 2 weeks at work and she has no previous elevated blood pressures.   States she has had a mild dull headache for the past 2 days over the right eye. States she is not sure if it is related to her BP being elevated or the conjunctival hemorrhage.   Denies fever, chills, dizziness, vision changes, chest pain, palpitations, shortness of breath, abdominal pain, N/V/D.   No blood thinners.   Untreated sleep apnea. States she has not used her CPAP for years.   Reviewed allergies, medications, past medical, surgical, and social history.   Review of Systems Pertinent positives and negatives in the history of present illness.     Objective:   Physical Exam  Constitutional: She is oriented to person, place, and time. She appears well-developed and well-nourished. No distress.  HENT:  Right Ear: Tympanic membrane and ear canal normal.  Left Ear: Tympanic membrane and ear canal normal.  Nose: Nose normal.  Mouth/Throat: Uvula is midline, oropharynx is clear and moist and mucous membranes are normal.  Eyes: Pupils are equal, round, and reactive to light. EOM and lids are normal. Right conjunctiva has a hemorrhage.    Neck: Normal range of motion. Neck supple.  Cardiovascular: Normal rate, regular rhythm, normal heart sounds and normal pulses.   Pulmonary/Chest: Effort normal and breath sounds normal.  Neurological: She is alert and oriented to person, place, and time. She  has normal strength and normal reflexes. Coordination and gait normal.  Skin: Skin is warm and dry. No pallor.  Psychiatric: She has a normal mood and affect. Her speech is normal and behavior is normal. Thought content normal.   BP 132/84 (BP Location: Left Arm, Patient Position: Sitting)   Pulse 76   Temp 98.4 F (36.9 C) (Oral)   Wt 209 lb 6.4 oz (95 kg)   SpO2 98%   BMI 32.80 kg/m      Assessment & Plan:  Elevated BP without diagnosis of hypertension  Conjunctival hemorrhage of right eye  Frontal headache  Discussed that her BP has improved since earlier today at her other appointments. Discussed that different BP cuffs will vary with their readings. Plan to have her continue checking her BP every week or so and follow up in 4 weeks.  Discussed healthy lifestyle to prevent HTN.  She was evaluated by an eye doctor today and given drops for her conjunctival hemorrhage. Discussed that this will take some time but should gradually improve. She is not anticoagulated. No other bleeding signs.  Not clear as to why she is having a right frontal headache but this does not appear to be related to her BP.  She can see me or Dr. Susann Givens her PCP in 4 weeks for a recheck. Advised that if she has any new symptoms to call us or return.

## 2017-06-12 NOTE — Patient Instructions (Addendum)
Keep an eye on your blood pressure over the next 2-4 weeks and if you are seeing readings >130/80 let us know.    Subconjunctival Hemorrhage Subconjunctival hemorrhage is bleeding that happens between the white part of your eye (sclera) and the clear membrane that covers the outside of your eye (conjunctiva). There are many tiny blood vessels near the surface of your eye. A subconjunctival hemorrhage happens when one or more of these vessels breaks and bleeds, causing a red patch to appear on your eye. This is similar to a bruise. Depending on the amount of bleeding, the red patch may only cover a small area of your eye or it may cover the entire visible part of the sclera. If a lot of blood collects under the conjunctiva, there may also be swelling. Subconjunctival hemorrhages do not affect your vision or cause pain, but your eye may feel irritated if there is swelling. Subconjunctival hemorrhages usually do not require treatment, and they disappear on their own within two weeks. What are the causes? This condition may be caused by:  Mild trauma, such as rubbing your eye too hard.  Severe trauma or blunt injuries.  Coughing, sneezing, or vomiting.  Straining, such as when lifting a heavy object.  High blood pressure.  Recent eye surgery.  A history of diabetes.  Certain medicines, especially blood thinners (anticoagulants).  Other conditions, such as eye tumors, bleeding disorders, or blood vessel abnormalities.  Subconjunctival hemorrhages can happen without an obvious cause. What are the signs or symptoms? Symptoms of this condition include:  A bright red or dark red patch on the white part of the eye. ? The red area may spread out to cover a larger area of the eye before it goes away. ? The red area may turn brownish-yellow before it goes away.  Swelling.  Mild eye irritation.  How is this diagnosed? This condition is diagnosed with a physical exam. If your subconjunctival  hemorrhage was caused by trauma, your health care provider may refer you to an eye specialist (ophthalmologist) or another specialist to check for other injuries. You may have other tests, including:  An eye exam.  A blood pressure check.  Blood tests to check for bleeding disorders.  If your subconjunctival hemorrhage was caused by trauma, X-rays or a CT scan may be done to check for other injuries. How is this treated? Usually, no treatment is needed. Your health care provider may recommend eye drops or cold compresses to help with discomfort. Follow these instructions at home:  Take over-the-counter and prescription medicines only as directed by your health care provider.  Use eye drops or cold compresses to help with discomfort as directed by your health care provider.  Avoid activities, things, and environments that may irritate or injure your eye.  Keep all follow-up visits as told by your health care provider. This is important. Contact a health care provider if:  You have pain in your eye.  The bleeding does not go away within 3 weeks.  You keep getting new subconjunctival hemorrhages. Get help right away if:  Your vision changes or you have difficulty seeing.  You suddenly develop severe sensitivity to light.  You develop a severe headache, persistent vomiting, confusion, or abnormal tiredness (lethargy).  Your eye seems to bulge or protrude from your eye socket.  You develop unexplained bruises on your body.  You have unexplained bleeding in another area of your body. This information is not intended to replace advice given to you  by your health care provider. Make sure you discuss any questions you have with your health care provider. Document Released: 08/20/2005 Document Revised: 04/15/2016 Document Reviewed: 10/27/2014 Elsevier Interactive Patient Education  [employer].

## 2017-07-15 ENCOUNTER — Ambulatory Visit: Payer: BC Managed Care – PPO | Admitting: Family Medicine

## 2017-08-10 ENCOUNTER — Other Ambulatory Visit: Payer: Self-pay | Admitting: Neurology

## 2017-08-10 DIAGNOSIS — G43109 Migraine with aura, not intractable, without status migrainosus: Secondary | ICD-10-CM

## 2017-10-22 ENCOUNTER — Ambulatory Visit: Payer: BC Managed Care – PPO | Admitting: Family Medicine

## 2017-10-22 ENCOUNTER — Encounter: Payer: Self-pay | Admitting: Family Medicine

## 2017-10-22 VITALS — BP 142/86 | HR 97 | Wt 206.6 lb

## 2017-10-22 DIAGNOSIS — R0789 Other chest pain: Secondary | ICD-10-CM

## 2017-10-22 DIAGNOSIS — K219 Gastro-esophageal reflux disease without esophagitis: Secondary | ICD-10-CM | POA: Diagnosis not present

## 2017-10-22 MED ORDER — NITROGLYCERIN 0.4 MG SL SUBL: 0.4000 mg | SUBLINGUAL_TABLET | SUBLINGUAL | 3 refills | Status: DC | PRN

## 2017-10-22 NOTE — Patient Instructions (Signed)
Use the nitroglycerin and repeated in 5 minutes.  if still having difficulty, call EMS.

## 2017-10-22 NOTE — Progress Notes (Signed)
   Subjective:    Patient ID: Karen Hicks, female    DOB: Oct 21, 1970, 47 y.o.   MRN: [id number]  HPI Yesterday she noted the onset of chest tightness as well as shortness of breath that lasted roughly 20 not taking any for the as needed does smoke minutes.  No associated diaphoresis or weakness.  The symptoms occurred again last night and lasted about 10 minutes.  She is again having difficulty today with chest tightness.  Family history is significant for grandparents having heart disease.  She does have a history of reflux disease and uses her PPI on an as-needed basis however she states that she is having less of the symptoms recently.  She does not smoke   Review of Systems     Objective:   Physical Exam Alert and in no distress.  No palpable chest wall tenderness.  Cardiac exam shows a regular rhythm without murmurs or gallops.  Lungs are clear to auscultation.  EKG did show some nonspecific ST-T depression in V2 3 and 4.       Assessment & Plan:  Chest tightness - Plan: EKG 12-Lead, CBC with Differential/Platelet, Comprehensive metabolic panel, Lipid panel, Ambulatory referral to Cardiology, nitroGLYCERIN (NITROSTAT) 0.4 MG SL tablet  Gastroesophageal reflux disease without esophagitis  Explained the use of nitroglycerin and repeating it in 5 minutes and is still having difficulty, call EMS.  She voiced understanding.

## 2017-10-23 ENCOUNTER — Telehealth: Payer: Self-pay

## 2017-10-23 LAB — CBC WITH DIFFERENTIAL/PLATELET
Basophils Absolute: 0 10*3/uL (ref 0.0–0.2)
Basos: 0 %
EOS (ABSOLUTE): 0 10*3/uL (ref 0.0–0.4)
Eos: 0 %
Hematocrit: 40.3 % (ref 34.0–46.6)
Hemoglobin: 13.6 g/dL (ref 11.1–15.9)
Immature Grans (Abs): 0 10*3/uL (ref 0.0–0.1)
Immature Granulocytes: 0 %
Lymphocytes Absolute: 1.8 10*3/uL (ref 0.7–3.1)
Lymphs: 10 %
MCH: 28.5 pg (ref 26.6–33.0)
MCHC: 33.7 g/dL (ref 31.5–35.7)
MCV: 85 fL (ref 79–97)
Monocytes Absolute: 0.6 10*3/uL (ref 0.1–0.9)
Monocytes: 3 %
Neutrophils Absolute: 15.5 10*3/uL — ABNORMAL HIGH (ref 1.4–7.0)
Neutrophils: 87 %
Platelets: 321 10*3/uL (ref 150–379)
RBC: 4.77 x10E6/uL (ref 3.77–5.28)
RDW: 14.4 % (ref 12.3–15.4)
WBC: 17.9 10*3/uL — ABNORMAL HIGH (ref 3.4–10.8)

## 2017-10-23 LAB — COMPREHENSIVE METABOLIC PANEL
ALT: 66 IU/L — ABNORMAL HIGH (ref 0–32)
AST: 55 IU/L — ABNORMAL HIGH (ref 0–40)
Albumin/Globulin Ratio: 1.7 (ref 1.2–2.2)
Albumin: 4.9 g/dL (ref 3.5–5.5)
Alkaline Phosphatase: 94 IU/L (ref 39–117)
BUN/Creatinine Ratio: 9 (ref 9–23)
BUN: 8 mg/dL (ref 6–24)
Bilirubin Total: 0.4 mg/dL (ref 0.0–1.2)
CO2: 18 mmol/L — ABNORMAL LOW (ref 20–29)
Calcium: 9.7 mg/dL (ref 8.7–10.2)
Chloride: 101 mmol/L (ref 96–106)
Creatinine, Ser: 0.87 mg/dL (ref 0.57–1.00)
GFR calc Af Amer: 92 mL/min/{1.73_m2} (ref >59)
GFR calc non Af Amer: 80 mL/min/{1.73_m2} (ref >59)
Globulin, Total: 2.9 g/dL (ref 1.5–4.5)
Glucose: 202 mg/dL — ABNORMAL HIGH (ref 65–99)
Potassium: 4.5 mmol/L (ref 3.5–5.2)
Sodium: 137 mmol/L (ref 134–144)
Total Protein: 7.8 g/dL (ref 6.0–8.5)

## 2017-10-23 LAB — LIPID PANEL
Chol/HDL Ratio: 5.9 ratio — ABNORMAL HIGH (ref 0.0–4.4)
Cholesterol, Total: 253 mg/dL — ABNORMAL HIGH (ref 100–199)
HDL: 43 mg/dL (ref >39)
LDL Calculated: 168 mg/dL — ABNORMAL HIGH (ref 0–99)
Triglycerides: 211 mg/dL — ABNORMAL HIGH (ref 0–149)
VLDL Cholesterol Cal: 42 mg/dL — ABNORMAL HIGH (ref 5–40)

## 2017-10-23 NOTE — Telephone Encounter (Signed)
Called pt to let her know that her referral was set up with [facility] and she will be seeing Dr. Mayford Knifeurner on Friday at 3:40 . Their office numbrer was provided which is [phone]. Thanks [employer]

## 2017-10-25 ENCOUNTER — Ambulatory Visit: Payer: BC Managed Care – PPO | Admitting: Cardiology

## 2017-10-25 ENCOUNTER — Encounter: Payer: Self-pay | Admitting: Cardiology

## 2017-10-25 ENCOUNTER — Telehealth: Payer: Self-pay

## 2017-10-25 DIAGNOSIS — R079 Chest pain, unspecified: Secondary | ICD-10-CM | POA: Diagnosis not present

## 2017-10-25 LAB — HGB A1C W/O EAG: Hgb A1c MFr Bld: 7.1 % — ABNORMAL HIGH (ref 4.8–5.6)

## 2017-10-25 LAB — SPECIMEN STATUS REPORT

## 2017-10-25 NOTE — Patient Instructions (Addendum)
Medication Instructions:  Your physician recommends that you continue on your current medications as directed. Please refer to the Current Medication list given to you today.  Labwork: Today for D-Dimer and Troponin   Testing/Procedures: Your physician has requested that you have an echocardiogram. Echocardiography is a painless test that uses sound waves to create images of your heart. It provides your doctor with information about the size and shape of your heart and how well your heart's chambers and valves are working. This procedure takes approximately one hour. There are no restrictions for this procedure.  Your physician has requested that you have a stress echocardiogram. For further information please visit [email or web address]. Please follow instruction sheet as given.   Follow-Up: Your physician wants you to follow-up as needed with Dr. Mayford Knifeurner.   Any Other Special Instructions Will Be Listed Below (If Applicable).    [facility]    Karen PeroneRena Dariel Betzer, RN  [phone]  If you need a refill on your cardiac medications before your next appointment, please call your pharmacy.

## 2017-10-25 NOTE — Telephone Encounter (Signed)
Called pt to have her to schedule a appt with Dr. Susann GivensLalonde next week . No answer left a message to have her call. Thanks [employer]

## 2017-10-25 NOTE — Progress Notes (Signed)
Cardiology Office Note    Date:  10/25/2017   ID:  Karen Hicks, DOB 09-08-1970, MRN [id number]  PCP:  Ronnald Nian, MD  Cardiologist:  Armanda Magic, MD   Chief Complaint  Patient presents with  . Chest Pain    History of Present Illness:  Karen Hicks is a 47 y.o. female who is being seen today for the evaluation of chest pain at the request of Ronnald Nian, MD.  This is a 47yo female with a history of PE after a meniscus repair, OSA and anemia who is referred for evaluation of CP.  She says that it feels like someone is squeezing her chest and is tight and pressure.  It is nonexertional but gets worse with exertion and not associated diaphoresis, nausea or weakness. They last about 2-3 minutes at a time.  She took 1 SL NTG yesterday which eased it but it has never gone completely for the past 3-4 days.  She has had some belching.  She has GERD and took Nexium but it has not helped.   She was seen on 10/22/2017 by Dr. Susann Givens he had another episode of chest tightness that day.12-lead EKG showed normal sinus rhythm with very mild nonspecific ST T wave abnormality in the anterior leads. Today she has has some more pain on top of the baseline pain she has had for several days along with diffuse body aches in her arms and legs. She has not had any SOB today.  No one in her family has CAD in 1st degree relatives but had several grandparents with CAD but she does not know their ages when they were diagnosed.    Past Medical History:  Diagnosis Date  . Allergy   . Anemia    when she was young  . Arthritis   . Complication of anesthesia    takes her alittle longer to wake up  . GERD (gastroesophageal reflux disease)   . Irritable bowel   . Lactose intolerance   . Migraine   . PE (pulmonary embolism)    in 1994 meniscus repair.  Saw Dr. Susann Givens  . Pneumonia   . PONV (postoperative nausea and vomiting)   . Restless legs syndrome   . Sleep apnea    2007.  Uses  the mask & machine when she needs it.  Dr. Susann Givens is aware    Past Surgical History:  Procedure Laterality Date  . ABDOMINAL HYSTERECTOMY  01/2006   FIBROIDS  . CESAREAN SECTION    . HARDWARE REMOVAL Left 08/03/2014   Procedure: HARDWARE REMOVAL;  Surgeon: Velna Ochs, MD;  Location: [facility];  Service: Orthopedics;  Laterality: Left;  . HARDWARE REMOVAL Right 07/31/2016   Procedure: HARDWARE REMOVAL;  Surgeon: Marcene Corning, MD;  Location: [facility] OR;  Service: Orthopedics;  Laterality: Right;  . KNEE SURGERY Bilateral    multiple  . 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 right wrist arthroscopy  2009  . TOTAL KNEE ARTHROPLASTY Left 08/03/2014   dr Jerl Santos  . TOTAL KNEE ARTHROPLASTY Left 08/03/2014   Procedure: LEFT TOTAL KNEE ARTHROPLASTY;  Surgeon: Velna Ochs, MD;  Location: [facility] OR;  Service: Orthopedics;  Laterality: Left;  . TOTAL KNEE ARTHROPLASTY Right 07/31/2016   Procedure: TOTAL KNEE ARTHROPLASTY;  Surgeon: Marcene Corning, MD;  Location: [facility] OR;  Service: Orthopedics;  Laterality: Right;    Current Medications: Current Meds  Medication  Sig  . alum & mag hydroxide-simeth (MAALOX/MYLANTA) 200-200-20 MG/5ML suspension Take 30 mLs by mouth every 6 (six) hours as needed for indigestion or heartburn.  Marland Kitchen anti-nausea (EMETROL) solution Take 10 mLs by mouth every 15 (fifteen) minutes as needed for nausea or vomiting.  . Colloidal Oatmeal (NEOSPORIN ECZEMA ESSENTIALS) 1 % CREA Apply 1 application topically daily as needed (for eczema).  . diphenhydrAMINE (BENADRYL) 25 mg capsule Take 25 mg by mouth daily as needed for allergies.  Marland Kitchen esomeprazole (NEXIUM) 20 MG capsule Take 1 capsule (20 mg total) by mouth daily before breakfast. (Patient taking differently: Take 20 mg by mouth daily as needed (heartburn). )  . loratadine (CLARITIN) 10 MG tablet Take 10 mg by mouth daily as needed for allergies.  .  nitroGLYCERIN (NITROSTAT) 0.4 MG SL tablet Place 1 tablet (0.4 mg total) under the tongue every 5 (five) minutes as needed for chest pain.  Marland Kitchen Propylene Glycol 0.6 % SOLN systane eye drops  . Tetrahydrozoline HCl (VISINE OP) Apply 1 drop to eye 2 (two) times daily as needed (allergies).  . zolmitriptan (ZOMIG) 5 MG nasal solution Place 1 spray into the nose as needed for migraine.   Current Facility-Administered Medications for the 10/25/17 encounter (Office Visit) with Quintella Reichert, MD  Medication  . amitriptyline (ELAVIL) tablet 25 mg    Allergies:   Mushroom extract complex; Peach flavor; Adhesive [tape]; and Penicillins   Social History   Socioeconomic History  . Marital status: Single    Spouse name: None  . Number of children: None  . Years of education: None  . Highest education level: None  Social Needs  . Financial resource strain: None  . Food insecurity - worry: None  . Food insecurity - inability: None  . Transportation needs - medical: None  . Transportation needs - non-medical: None  Occupational History  . None  Tobacco Use  . Smoking status: Never Smoker  . Smokeless tobacco: Never Used  Substance and Sexual Activity  . Alcohol use: Yes    Comment: "with a good steak"  . Drug use: No  . Sexual activity: Not Currently  Other Topics Concern  . None  Social History Narrative   Lives with son in a one story home.  Works as a Loss adjuster, chartered at [employer].  Education: Masters     Family History:  The patient's family history includes Arthritis in her father and mother; Diabetes in her father and mother; Gallbladder disease in her father and mother; Glaucoma in her mother; Healthy in her son; Heart disease in her mother; Hypertension in her father and mother; Kidney disease in her father and mother; Migraines in her brother, mother, and sister; Seizures in her mother; Stroke in her mother.   ROS:   Please see the history of present illness.    ROS All other systems  reviewed and are negative.  No flowsheet data found.     PHYSICAL EXAM:   VS:  BP 130/82   Pulse 88   Ht 5\' 7"  (1.702 m)   Wt 206 lb 6.4 oz (93.6 kg)   SpO2 97%   BMI 32.33 kg/m    GEN: Well nourished, well developed, in no acute distress  HEENT: normal  Neck: no JVD, carotid bruits, or masses Cardiac: RRR; no murmurs, rubs, or gallops,no edema.  Intact distal pulses bilaterally.  Respiratory:  clear to auscultation bilaterally, normal work of breathing GI: soft, nontender, nondistended, + BS MS: no deformity or atrophy  Skin: warm and dry, no rash Neuro:  Alert and Oriented x 3, Strength and sensation are intact Psych: euthymic mood, full affect  Wt Readings from Last 3 Encounters:  10/25/17 206 lb 6.4 oz (93.6 kg)  10/22/17 206 lb 9.6 oz (93.7 kg)  06/12/17 209 lb 6.4 oz (95 kg)      Studies/Labs Reviewed:   EKG:  EKG is ordered today and showed NSR at 72bpm withy LVH by voltage and nonspecific T wave abnormality Recent Labs: 10/22/2017: ALT 66; BUN 8; Creatinine, Ser 0.87; Hemoglobin 13.6; Platelets 321; Potassium 4.5; Sodium 137   Lipid Panel    Component Value Date/Time   CHOL 253 (H) 10/22/2017 1405   TRIG 211 (H) 10/22/2017 1405   HDL 43 10/22/2017 1405   CHOLHDL 5.9 (H) 10/22/2017 1405   CHOLHDL 6.9 06/10/2013 1413   VLDL 47 (H) 06/10/2013 1413   LDLCALC 168 (H) 10/22/2017 1405    Additional studies/ records that were reviewed today include:  Office notes from PCP he take it easy we will touch base is significant for back    ASSESSMENT:    1. Chest pain, unspecified type      PLAN:  In order of problems listed above:  1. Chest pain - her pain is atypical in that it has been constant to some degree for several days, is nonexertional with no associated sx she also has other achiness in her arms and legs.  Her only  CRFs are ? family history of CAD but not at an early age and hyperlipidemia.  Her EKG is mildly abnormal with mild LVH and possible  repol abnormality with very mild nonspecific T wave abnormality in the anterior leads.  She has some mild chest pain currently with no change in EKG.  I will get a stat trop and d-dimer since she has a history of remote PE.  I suspect her CP is not cardiac in origin.  I will set her up for stress echo if enzymes normal and 2D echo to rule out pericardial effusion.      Medication Adjustments/Labs and Tests Ordered: Current medicines are reviewed at length with the patient today.  Concerns regarding medicines are outlined above.  Medication changes, Labs and Tests ordered today are listed in the Patient Instructions below.  There are no Patient Instructions on file for this visit.   Signed, Armanda Magicraci Mayson Mcneish, MD  10/25/2017 3:06 PM    [facility] [address], [place], [place]  [address] Phone: [phone]; Fax: [phone]

## 2017-10-26 LAB — D-DIMER, QUANTITATIVE (NOT AT ARMC): D-DIMER: 0.47 mg/L FEU (ref 0.00–0.49)

## 2017-10-28 ENCOUNTER — Telehealth: Payer: Self-pay | Admitting: Cardiology

## 2017-10-28 ENCOUNTER — Other Ambulatory Visit: Payer: Self-pay

## 2017-10-28 DIAGNOSIS — R079 Chest pain, unspecified: Secondary | ICD-10-CM

## 2017-10-28 NOTE — Telephone Encounter (Signed)
Spoke with patient. Patient made aware of d-dimer results. Patient verbalized understanding and thankful for the call

## 2017-10-28 NOTE — Telephone Encounter (Signed)
New Message ° ° °Patient is returning call in reference to labs. Please call to discuss.  °

## 2017-10-28 NOTE — Progress Notes (Signed)
Stat troponin level per Dr. Mayford Knifeurner

## 2017-10-28 NOTE — Addendum Note (Signed)
Addended by: Tonita PhoenixBOWDEN, Ebrahim Deremer K on: 10/28/2017 04:45 PM   Modules accepted: Orders

## 2017-10-28 NOTE — Addendum Note (Signed)
Addended by: Phineas SemenOBERTSON, Jacobey Gura on: 10/28/2017 01:28 PM   Modules accepted: Orders

## 2017-10-28 NOTE — Addendum Note (Signed)
Addended by: Tonita PhoenixBOWDEN, Roderick Calo K on: 10/28/2017 02:01 PM   Modules accepted: Orders

## 2017-10-29 LAB — TROPONIN T: Troponin T TROPT: <0.011 ng/mL (ref <0.011)

## 2017-11-07 ENCOUNTER — Telehealth (HOSPITAL_COMMUNITY): Payer: Self-pay | Admitting: *Deleted

## 2017-11-07 NOTE — Telephone Encounter (Signed)
Patient given detailed instructions per Stress Test Requisition Sheet for test on 11/12/17 at 2:30.Patient Notified to arrive 30 minutes early, and that it is imperative to arrive on time for appointment to keep from having the test rescheduled.  Patient verbalized understanding. Karen DolinSharon S Brooks

## 2017-11-08 ENCOUNTER — Other Ambulatory Visit: Payer: Self-pay

## 2017-11-08 ENCOUNTER — Ambulatory Visit: Payer: BC Managed Care – PPO | Admitting: Family Medicine

## 2017-11-08 ENCOUNTER — Encounter: Payer: Self-pay | Admitting: Family Medicine

## 2017-11-08 VITALS — BP 142/85 | HR 93 | Temp 98.0°F | Resp 16 | Ht 67.72 in | Wt 208.0 lb

## 2017-11-08 DIAGNOSIS — R300 Dysuria: Secondary | ICD-10-CM

## 2017-11-08 LAB — POCT URINALYSIS DIP (MANUAL ENTRY)
Bilirubin, UA: NEGATIVE
Blood, UA: NEGATIVE
Glucose, UA: NEGATIVE mg/dL
Ketones, POC UA: NEGATIVE mg/dL
Nitrite, UA: NEGATIVE
Protein Ur, POC: NEGATIVE mg/dL
Spec Grav, UA: 1.01 (ref 1.010–1.025)
Urobilinogen, UA: 0.2 E.U./dL
pH, UA: 6.5 (ref 5.0–8.0)

## 2017-11-08 MED ORDER — CIPROFLOXACIN HCL 500 MG PO TABS: 500.0000 mg | ORAL_TABLET | Freq: Two times a day (BID) | ORAL | 0 refills | Status: DC

## 2017-11-08 MED ORDER — PHENAZOPYRIDINE HCL 100 MG PO TABS: 100.0000 mg | ORAL_TABLET | Freq: Three times a day (TID) | ORAL | 0 refills | Status: DC | PRN

## 2017-11-08 NOTE — Progress Notes (Signed)
Subjective:    Patient ID: Karen Hicks, female    DOB: 1970/09/06, 47 y.o.   MRN: [id number]  11/08/2017  Dysuria    HPI This 47 y.o. female presents for evaluation of dysuria.   Yesterday, went to restroom with dysuria.  Painful.  Drank sprite yesterday. Cardiologist recommending echo.  Avoiding sodas. Suprapubic pressure; horrible pain.  9/10.  Unable to walk.   [employer]; phone out of service. Called OB/GYN. [place] tonight to [facility].   No fever; no n/v/ no vaginal odor; no vaginal discharge.  Hysterectomy.  One ovary remaining.     BP Readings from Last 3 Encounters:  11/08/17 (!) 142/85  10/25/17 130/82  10/22/17 (!) 142/86   Wt Readings from Last 3 Encounters:  11/08/17 208 lb (94.3 kg)  10/25/17 206 lb 6.4 oz (93.6 kg)  10/22/17 206 lb 9.6 oz (93.7 kg)    There is no immunization history on file for this patient.  Review of Systems  Constitutional: Negative for chills, diaphoresis, fatigue and fever.  HENT: Negative for ear pain, postnasal drip, rhinorrhea, sinus pressure, sore throat and trouble swallowing.   Respiratory: Negative for cough and shortness of breath.   Cardiovascular: Negative for chest pain, palpitations and leg swelling.  Gastrointestinal: Negative for abdominal pain, constipation, diarrhea, nausea and vomiting.  Genitourinary: Positive for dysuria, frequency and urgency. Negative for decreased urine volume, flank pain, genital sores, hematuria, menstrual problem, pelvic pain, vaginal bleeding, vaginal discharge and vaginal pain.    Past Medical History:  Diagnosis Date  . Allergy   . Anemia    when she was young  . Arthritis   . Complication of anesthesia    takes her alittle longer to wake up  . GERD (gastroesophageal reflux disease)   . Irritable bowel   . Lactose intolerance   . Migraine   . PE (pulmonary embolism)    in 1994 meniscus repair.  Saw Dr. Susann Givens  . Pneumonia   . PONV (postoperative nausea  and vomiting)   . Restless legs syndrome   . Sleep apnea    2007.  Uses the mask & machine when she needs it.  Dr. Susann Givens is aware   Past Surgical History:  Procedure Laterality Date  . ABDOMINAL HYSTERECTOMY  01/2006   FIBROIDS  . CESAREAN SECTION    . HARDWARE REMOVAL Left 08/03/2014   Procedure: HARDWARE REMOVAL;  Surgeon: Velna Ochs, MD;  Location: [facility] OR;  Service: Orthopedics;  Laterality: Left;  . HARDWARE REMOVAL Right 07/31/2016   Procedure: HARDWARE REMOVAL;  Surgeon: Marcene Corning, MD;  Location: [facility] OR;  Service: Orthopedics;  Laterality: Right;  . KNEE SURGERY Bilateral    multiple  . 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 right wrist arthroscopy  2009  . TOTAL KNEE ARTHROPLASTY Left 08/03/2014   dr Jerl Santos  . TOTAL KNEE ARTHROPLASTY Left 08/03/2014   Procedure: LEFT TOTAL KNEE ARTHROPLASTY;  Surgeon: Velna Ochs, MD;  Location: [facility] OR;  Service: Orthopedics;  Laterality: Left;  . TOTAL KNEE ARTHROPLASTY Right 07/31/2016   Procedure: TOTAL KNEE ARTHROPLASTY;  Surgeon: Marcene Corning, MD;  Location: [facility] OR;  Service: Orthopedics;  Laterality: Right;   Allergies  Allergen Reactions  . Mushroom Extract Complex Anaphylaxis  . Peach Flavor Anaphylaxis  . Adhesive [Tape] Hives  . Penicillins Hives and Rash    Tingling and shaking Has patient had a PCN reaction  causing immediate rash, facial/tongue/throat swelling, SOB or lightheadedness with hypotension: Yes Has patient had a PCN reaction causing severe rash involving mucus membranes or skin necrosis: Yes Has patient had a PCN reaction that required hospitalization No Has patient had a PCN reaction occurring within the last 10 years: Yes If all of the above answers are "NO", then may proceed with Cephalosporin use.    Current Outpatient Medications on File Prior to Visit  Medication Sig Dispense Refill  . alum & mag  hydroxide-simeth (MAALOX/MYLANTA) 200-200-20 MG/5ML suspension Take 30 mLs by mouth every 6 (six) hours as needed for indigestion or heartburn.    Marland Kitchen anti-nausea (EMETROL) solution Take 10 mLs by mouth every 15 (fifteen) minutes as needed for nausea or vomiting.    . Colloidal Oatmeal (NEOSPORIN ECZEMA ESSENTIALS) 1 % CREA Apply 1 application topically daily as needed (for eczema).    . diphenhydrAMINE (BENADRYL) 25 mg capsule Take 25 mg by mouth daily as needed for allergies.    Marland Kitchen esomeprazole (NEXIUM) 20 MG capsule Take 1 capsule (20 mg total) by mouth daily before breakfast. (Patient taking differently: Take 20 mg by mouth daily as needed (heartburn). ) 30 capsule 11  . loratadine (CLARITIN) 10 MG tablet Take 10 mg by mouth daily as needed for allergies.    . nitroGLYCERIN (NITROSTAT) 0.4 MG SL tablet Place 1 tablet (0.4 mg total) under the tongue every 5 (five) minutes as needed for chest pain. 50 tablet 3  . Propylene Glycol 0.6 % SOLN systane eye drops    . Tetrahydrozoline HCl (VISINE OP) Apply 1 drop to eye 2 (two) times daily as needed (allergies).    . zolmitriptan (ZOMIG) 5 MG nasal solution Place 1 spray into the nose as needed for migraine. 6 each 5   Current Facility-Administered Medications on File Prior to Visit  Medication Dose Route Frequency Provider Last Rate Last Dose  . amitriptyline (ELAVIL) tablet 25 mg  25 mg Oral QHS Ronnald Nian, MD       Social History   Socioeconomic History  . Marital status: Single    Spouse name: Not on file  . Number of children: Not on file  . Years of education: Not on file  . Highest education level: Not on file  Occupational History  . Not on file  Social Needs  . Financial resource strain: Not on file  . Food insecurity:    Worry: Not on file    Inability: Not on file  . Transportation needs:    Medical: Not on file    Non-medical: Not on file  Tobacco Use  . Smoking status: Never Smoker  . Smokeless tobacco: Never Used    Substance and Sexual Activity  . Alcohol use: Yes    Comment: "with a good steak"  . Drug use: No  . Sexual activity: Not Currently  Lifestyle  . Physical activity:    Days per week: Not on file    Minutes per session: Not on file  . Stress: Not on file  Relationships  . Social connections:    Talks on phone: Not on file    Gets together: Not on file    Attends religious service: Not on file    Active member of club or organization: Not on file    Attends meetings of clubs or organizations: Not on file    Relationship status: Not on file  . Intimate partner violence:    Fear of current or ex partner: Not  on file    Emotionally abused: Not on file    Physically abused: Not on file    Forced sexual activity: Not on file  Other Topics Concern  . Not on file  Social History Narrative   Lives with son in a one story home.  Works as a Loss adjuster, chartered at [employer].  Education: Masters   Family History  Problem Relation Age of Onset  . Arthritis Mother   . Diabetes Mother   . Heart disease Mother   . Hypertension Mother   . Kidney disease Mother   . Stroke Mother   . Gallbladder disease Mother   . Glaucoma Mother   . Seizures Mother   . Migraines Mother   . Arthritis Father   . Diabetes Father   . Hypertension Father   . Kidney disease Father   . Gallbladder disease Father   . Migraines Sister   . Migraines Brother   . Healthy Son        Objective:    BP (!) 142/85   Pulse 93   Temp 98 F (36.7 C) (Oral)   Resp 16   Ht 5' 7.72" (1.72 m)   Wt 208 lb (94.3 kg)   SpO2 98%   BMI 31.89 kg/m  Physical Exam  Constitutional: She is oriented to person, place, and time. She appears well-developed and well-nourished. No distress.  HENT:  Head: Normocephalic and atraumatic.  Eyes: Conjunctivae are normal. Pupils are equal, round, and reactive to light.  Neck: Normal range of motion. Neck supple.  Cardiovascular: Normal rate, regular rhythm and normal heart sounds. Exam  reveals no gallop and no friction rub.  No murmur heard. Pulmonary/Chest: Effort normal and breath sounds normal. She has no wheezes. She has no rales.  Abdominal: Soft. Bowel sounds are normal. She exhibits no distension and no mass. There is no tenderness. There is no rebound, no guarding and no CVA tenderness.  Neurological: She is alert and oriented to person, place, and time.  Skin: She is not diaphoretic.  Psychiatric: She has a normal mood and affect. Her behavior is normal.  Nursing note and vitals reviewed.  No results found. Depression screen PHQ 2/9 11/08/2017  Decreased Interest 0  Down, Depressed, Hopeless 0  PHQ - 2 Score 0   Fall Risk  11/08/2017 04/11/2016  Falls in the past year? No No   Results for orders placed or performed in visit on 11/08/17  Urine Culture  Result Value Ref Range   Urine Culture, Routine Final report    Organism ID, Bacteria No growth   POCT urinalysis dipstick  Result Value Ref Range   Color, UA yellow yellow   Clarity, UA cloudy (A) clear   Glucose, UA negative negative mg/dL   Bilirubin, UA negative negative   Ketones, POC UA negative negative mg/dL   Spec Grav, UA 1.610 9.604 - 1.025   Blood, UA negative negative   pH, UA 6.5 5.0 - 8.0   Protein Ur, POC negative negative mg/dL   Urobilinogen, UA 0.2 0.2 or 1.0 E.U./dL   Nitrite, UA Negative Negative   Leukocytes, UA Trace (A) Negative        Assessment & Plan:   1. Dysuria     New onset; send urine cx; due to PCN allergy, treat with Cipro and Pyridium.  RTC for acute worsening or vomiting, severe flank pain.   Orders Placed This Encounter  Procedures  . Urine Culture  . POCT urinalysis dipstick  Meds ordered this encounter  Medications  . ciprofloxacin (CIPRO) 500 MG tablet    Sig: Take 1 tablet (500 mg total) by mouth 2 (two) times daily.    Dispense:  10 tablet    Refill:  0  . phenazopyridine (PYRIDIUM) 100 MG tablet    Sig: Take 1 tablet (100 mg total) by mouth 3  (three) times daily as needed for pain.    Dispense:  10 tablet    Refill:  0    No follow-ups on file.   Malloree Raboin Paulita FujitaMartin Pennye Beeghly, M.D. [facility]  [place] [facility] [address] [place], [place]  [address] [phone] phone [phone] fax

## 2017-11-08 NOTE — Patient Instructions (Addendum)
     IF you received an x-ray today, you will receive an invoice from [facility]. Please contact  Radiology at [phone] with questions or concerns regarding your invoice.   IF you received labwork today, you will receive an invoice from [facility]. Please contact [facility] at [phone] with questions or concerns regarding your invoice.   Our billing staff will not be able to assist you with questions regarding bills from these companies.  You will be contacted with the lab results as soon as they are available. The fastest way to get your results is to activate your My Chart account. Instructions are located on the last page of this paperwork. If you have not heard from us regarding the results in 2 weeks, please contact this office.     Urinary Tract Infection, Adult A urinary tract infection (UTI) is an infection of any part of the urinary tract. The urinary tract includes the:  Kidneys.  Ureters.  Bladder.  Urethra.  These organs make, store, and get rid of pee (urine) in the body. Follow these instructions at home:  Take over-the-counter and prescription medicines only as told by your doctor.  If you were prescribed an antibiotic medicine, take it as told by your doctor. Do not stop taking the antibiotic even if you start to feel better.  Avoid the following drinks: ? Alcohol. ? Caffeine. ? Tea. ? Carbonated drinks.  Drink enough fluid to keep your pee clear or pale yellow.  Keep all follow-up visits as told by your doctor. This is important.  Make sure to: ? Empty your bladder often and completely. Do not to hold pee for long periods of time. ? Empty your bladder before and after sex. ? Wipe from front to back after a bowel movement if you are female. Use each tissue one time when you wipe. Contact a doctor if:  You have back pain.  You have a fever.  You feel sick to your stomach (nauseous).  You throw up (vomit).  Your symptoms do  not get better after 3 days.  Your symptoms go away and then come back. Get help right away if:  You have very bad back pain.  You have very bad lower belly (abdominal) pain.  You are throwing up and cannot keep down any medicines or water. This information is not intended to replace advice given to you by your health care provider. Make sure you discuss any questions you have with your health care provider. Document Released: 02/06/2008 Document Revised: 01/26/2016 Document Reviewed: 07/11/2015 Elsevier Interactive Patient Education  2018 [employer] Inc[employer]  

## 2017-11-09 LAB — URINE CULTURE: Organism ID, Bacteria: NO GROWTH

## 2017-11-12 ENCOUNTER — Ambulatory Visit (HOSPITAL_COMMUNITY): Payer: BC Managed Care – PPO | Attending: Cardiology

## 2017-11-12 ENCOUNTER — Other Ambulatory Visit: Payer: Self-pay

## 2017-11-12 ENCOUNTER — Ambulatory Visit (HOSPITAL_BASED_OUTPATIENT_CLINIC_OR_DEPARTMENT_OTHER): Payer: BC Managed Care – PPO

## 2017-11-12 DIAGNOSIS — R079 Chest pain, unspecified: Secondary | ICD-10-CM

## 2017-11-12 DIAGNOSIS — Z8249 Family history of ischemic heart disease and other diseases of the circulatory system: Secondary | ICD-10-CM | POA: Insufficient documentation

## 2017-11-12 DIAGNOSIS — Z86711 Personal history of pulmonary embolism: Secondary | ICD-10-CM | POA: Diagnosis not present

## 2017-11-12 DIAGNOSIS — G43909 Migraine, unspecified, not intractable, without status migrainosus: Secondary | ICD-10-CM | POA: Diagnosis not present

## 2017-11-12 DIAGNOSIS — G4733 Obstructive sleep apnea (adult) (pediatric): Secondary | ICD-10-CM | POA: Diagnosis not present

## 2017-11-18 ENCOUNTER — Telehealth: Payer: Self-pay | Admitting: Cardiology

## 2017-11-18 NOTE — Telephone Encounter (Signed)
New Message:    Pt returning phone call from [name] and states someone else called but not sure of the name. Pt was calling for results from her Echo she had done last week.

## 2017-11-18 NOTE — Telephone Encounter (Signed)
Patient aware of echo results. Per Dr. Mayford Knifeurner echo was normal. Patient stated her chest pain is resolved.

## 2017-12-31 ENCOUNTER — Encounter: Payer: Self-pay | Admitting: Family Medicine

## 2017-12-31 ENCOUNTER — Ambulatory Visit: Payer: BC Managed Care – PPO | Admitting: Family Medicine

## 2017-12-31 VITALS — BP 142/88 | HR 71 | Temp 97.9°F | Ht 67.0 in | Wt 210.0 lb

## 2017-12-31 DIAGNOSIS — E1169 Type 2 diabetes mellitus with other specified complication: Secondary | ICD-10-CM

## 2017-12-31 DIAGNOSIS — E119 Type 2 diabetes mellitus without complications: Secondary | ICD-10-CM

## 2017-12-31 DIAGNOSIS — E785 Hyperlipidemia, unspecified: Secondary | ICD-10-CM

## 2017-12-31 MED ORDER — ROSUVASTATIN CALCIUM 40 MG PO TABS: 40.0000 mg | ORAL_TABLET | Freq: Every day | ORAL | 3 refills | Status: DC

## 2017-12-31 NOTE — Progress Notes (Signed)
   Subjective:    Patient ID: Karen Hicks, female    DOB: 04/22/1971, 47 y.o.   MRN: [id number]  HPI She is here for a recheck.  She was recently seen by cardiology because of an abnormal EKG.  Cardiac evaluation was essentially negative.  During the work-up her blood sugar was elevated and hemoglobin A1c was noted to be 7.1.  She does have a strong family history of diabetes with all of her other siblings having this.  Presently she has no diabetes related symptoms.   Review of Systems     Objective:   Physical Exam Alert and in no distress otherwise not examined       Assessment & Plan:  New onset type 2 diabetes mellitus (HCC) - Plan: Amb Referral to Nutrition and Diabetic E  Hyperlipidemia associated with type 2 diabetes mellitus (HCC) - Plan: rosuvastatin (CRESTOR) 40 MG tablet I discussed diabetes in regard to diet, exercise, medications.  Also briefly discussed foot care, eye care, checking blood sugars before and after meals.  Recommended that she go to the American diabetes Association website or [email or web address] for more information concerning diabetes. I will place her on Crestor.  Discussed possible side effects of that medication.  Discussed placing her on an ACE inhibitor but will wait until her next visit.  Recheck here in 2 months.  Over 25 minutes, the entire time spent in counseling and coordination of care.

## 2017-12-31 NOTE — Patient Instructions (Signed)
Go to the American diabetes Association website or [email or web address].org 20 minutes of something physical daily or 150 minutes a week without

## 2018-01-06 ENCOUNTER — Telehealth: Payer: Self-pay

## 2018-01-06 ENCOUNTER — Other Ambulatory Visit: Payer: Self-pay | Admitting: Family Medicine

## 2018-01-06 ENCOUNTER — Other Ambulatory Visit: Payer: Self-pay

## 2018-01-06 MED ORDER — GLUCOSE BLOOD VI STRP: ORAL_STRIP | 12 refills | Status: DC

## 2018-01-06 NOTE — Telephone Encounter (Signed)
Need dx so I can send dx code with script. Thanks [employer]

## 2018-01-06 NOTE — Telephone Encounter (Signed)
Thanks [facility] 

## 2018-01-06 NOTE — Telephone Encounter (Signed)
Go ahead and call the scripts in.  She definitely has diabetes now

## 2018-01-06 NOTE — Telephone Encounter (Signed)
E11.9

## 2018-01-06 NOTE — Telephone Encounter (Signed)
Pt called needing test strips. Pt needs DX last a1c was 7.1 . Please advise so strips can be sent in. [facility]

## 2018-01-21 ENCOUNTER — Telehealth: Payer: Self-pay | Admitting: Family Medicine

## 2018-01-21 MED ORDER — GLUCOSE BLOOD VI STRP: ORAL_STRIP | 12 refills | Status: AC

## 2018-01-21 NOTE — Telephone Encounter (Signed)
Pt left message that Contour strips not covered at [facility] & One touch is preferred.  Called pharmacy and switched pt's diabetic supplies to One touch meter and strips.  Called and left pt message

## 2018-01-23 ENCOUNTER — Encounter: Payer: Self-pay | Admitting: Family Medicine

## 2018-02-05 ENCOUNTER — Encounter: Payer: BC Managed Care – PPO | Attending: Family Medicine | Admitting: *Deleted

## 2018-02-05 DIAGNOSIS — Z713 Dietary counseling and surveillance: Secondary | ICD-10-CM | POA: Insufficient documentation

## 2018-02-05 DIAGNOSIS — E119 Type 2 diabetes mellitus without complications: Secondary | ICD-10-CM | POA: Insufficient documentation

## 2018-02-21 ENCOUNTER — Encounter: Payer: Self-pay | Admitting: Registered"

## 2018-02-21 ENCOUNTER — Encounter: Payer: BC Managed Care – PPO | Admitting: Registered"

## 2018-02-21 DIAGNOSIS — E119 Type 2 diabetes mellitus without complications: Secondary | ICD-10-CM

## 2018-02-21 DIAGNOSIS — Z713 Dietary counseling and surveillance: Secondary | ICD-10-CM | POA: Diagnosis not present

## 2018-02-21 NOTE — Patient Instructions (Addendum)
Continue to have balanced meals and snacks Continue to include whole grains, lean protein, plenty of vegetables and fruit. Continue to eat mindfully and pay attention to body cues. Avoid skipping meals to prevent hypoglycemia If your lab work comes back with high triglycerides, talk to your doctor about adding omega 3 supplements.

## 2018-02-21 NOTE — Progress Notes (Signed)
Diabetes Self-Management Education  Visit Type: First/Initial  Appt. Start Time: 0810 Appt. End Time: 0915  02/21/2018  [name], identified by name and date of birth, is a 47 y.o. female with a diagnosis of Diabetes: Type 2.   ASSESSMENT Pt states she made many changes when she was told she was pre-diabetic (A1c 7.5%) including exercising minimum of 20 min/day, cut out soda, switched juice to diet, only occasional sweets (lunch & learn or going out the buffet), increased fish intake to ~3x week, fiber, berries, whole grains. Pt states she now only uses olive oil, mostly bakes, broil, [employer] grill, air fryer and occasionally will still fry.   Pt states she tries not to eat after 8 pm, but has a habit of having a snack in the evening, usually popcorn while at the computer working.  Pt states one time when she first made changes she saw a BG reading of 57 mg/dL but did not have symptoms and states she had skipped a meal. RD emphasized the importance of not skipping meals.   Pt states she has a goal of participating in a triathalon in August and has been training at the gym to be able to ride a bike 8 miles.   Sleep: 7-8 hrs states she wakes up refreshed and the exercise increases quality of sleep.  Pt states she has a goal weight of 150 lbs, both PCP and orthopedic doctors are encouraging weight loss. Pt states she has genetics of large body and is mostly concerned about her upper body & belly fat. RD discussed weight loss plateaus and tried to encourage Pt to focus on other measures of health besides the scale such as BG, lab work and reduced pain.  Diabetes Self-Management Education - 02/21/18 0833      Visit Information   Visit Type  First/Initial      Initial Visit   Diabetes Type  Type 2    Are you currently following a meal plan?  No    Are you taking your medications as prescribed?  Not on Medications      Health Coping   How would you rate your overall  health?  Fair      Psychosocial Assessment   Patient Belief/Attitude about Diabetes  Motivated to manage diabetes    How often do you need to have someone help you when you read instructions, pamphlets, or other written materials from your doctor or pharmacy?  1 - Never    What is the last grade level you completed in school?  masters degree      Complications   Last HgB A1C per patient/outside source  7.5 %    How often do you check your blood sugar?  1-2 times/day    Fasting Blood glucose range (mg/dL)  [phone]    Postprandial Blood glucose range (mg/dL)  28-413;[phone];130-179    Number of hypoglycemic episodes per month  0    Number of hyperglycemic episodes per week  0    Have you had a dilated eye exam in the past 12 months?  Yes    Have you had a dental exam in the past 12 months?  Yes    Are you checking your feet?  No      Dietary Intake   Breakfast  veggies OR 1/2 bagel with sunflower seeds or PB or chicken salad    Snack (morning)  grazing, sometimes doesn't eat meal    Lunch  (tuna, chicken or [place]),  crackers or whole grain    Snack (afternoon)  trail mix    Dinner  Investment banker, corporate) baked chicken, shrimp, cabbage, taco salad, chicken noodle soup, fruit, carrot    Snack (evening)  popcorn OR veggie fries OR veggies    Beverage(s)  water, diet juice, unsweet tea      Exercise   Exercise Type  Light (walking / raking leaves)    How many days per week to you exercise?  3    How many minutes per day do you exercise?  30    Total minutes per week of exercise  90      Patient Education   Previous Diabetes Education  No    Nutrition management   Role of diet in the treatment of diabetes and the relationship between the three main macronutrients and blood glucose level    Physical activity and exercise   Role of exercise on diabetes management, blood pressure control and cardiac health.    Monitoring  Identified appropriate SMBG and/or A1C goals.    Chronic complications  Assessed  and discussed foot care and prevention of foot problems      Individualized Goals (developed by patient)   Nutrition  General guidelines for healthy choices and portions discussed    Physical Activity  15 minutes per day      Outcomes   Expected Outcomes  Demonstrated interest in learning. Expect positive outcomes    Future DMSE  PRN    Program Status  Completed     Individualized Plan for Diabetes Self-Management Training:   Learning Objective:  Patient will have a greater understanding of diabetes self-management. Patient education plan is to attend individual and/or group sessions per assessed needs and concerns.   Patient Instructions  Continue to have balanced meals and snacks Continue to include whole grains, lean protein, plenty of vegetables and fruit. Continue to eat mindfully and pay attention to body cues. Avoid skipping meals to prevent hypoglycemia   Expected Outcomes:  Demonstrated interest in learning. Expect positive outcomes  Education material provided: ADA Diabetes: Your Take Control Guide and A1C conversion sheet  If problems or questions, patient to contact team via:  Phone and MyChart  Future DSME appointment: PRN

## 2018-03-05 ENCOUNTER — Ambulatory Visit: Payer: BC Managed Care – PPO | Admitting: Family Medicine

## 2018-03-05 ENCOUNTER — Encounter: Payer: Self-pay | Admitting: Family Medicine

## 2018-03-05 VITALS — BP 132/84 | HR 67 | Temp 97.7°F | Wt 207.6 lb

## 2018-03-05 DIAGNOSIS — E119 Type 2 diabetes mellitus without complications: Secondary | ICD-10-CM

## 2018-03-05 LAB — POCT GLYCOSYLATED HEMOGLOBIN (HGB A1C): Hemoglobin A1C: 7.6 % — AB (ref 4.0–5.6)

## 2018-03-05 NOTE — Progress Notes (Signed)
  Subjective:    Patient ID: Stacy GardnerIeschecia Graham-Allen, female    DOB: Mar 13, 1971, 47 y.o.   MRN: [id number]  [name] is a 47 y.o. female who presents for follow-up of Type 2 diabetes mellitus.  Patient is checking home blood sugars.   Home blood sugar records: BGs consistently in an acceptable range 140 range How often is blood sugars being checked:  current symptoms/problems include none and have been unchanged. Daily foot checks: no   Any foot concerns: no Last eye exam: n/a Exercise: in an exercise program The following portions of the patient's history were reviewed and updated as appropriate: allergies, current medications, past medical history, past social history and problem list.  ROS as in subjective above.     Objective:    Physical Exam Alert and in no distress otherwise not examined.   Lab Review Diabetic Labs Latest Ref Rng & Units 10/22/2017 07/25/2016 03/13/2015 12/14/2014 08/04/2014  HbA1c 4.8 - 5.6 % 7.1(H) - - - -  Chol 100 - 199 mg/dL 213(Y253(H) - - - -  HDL >86>39 mg/dL 43 - - - -  Calc LDL 0 - 99 mg/dL 578(I168(H) - - - -  Triglycerides 0 - 149 mg/dL 696(E211(H) - - - -  Creatinine 0.57 - 1.00 mg/dL 9.520.87 8.410.64 3.240.74 4.010.91 0.270.66   BP/Weight 12/31/2017 11/08/2017 10/25/2017 10/22/2017 06/12/2017  Systolic BP 142 142 130 142 132  Diastolic BP 88 85 82 86 84  Wt. (Lbs) 210 208 206.4 206.6 209.4  BMI 32.89 31.89 32.33 32.36 32.8   A1c is 7.6  Hawley  reports that she has never smoked. She has never used smokeless tobacco. She reports that she drinks alcohol. She reports that she does not use drugs.     Assessment & Plan:    New onset type 2 diabetes mellitus (HCC)   1. Rx changes: none 2. Education: Reviewed 'ABCs' of diabetes management (respective goals in parentheses):  A1C (<7), blood pressure (<130/80), and cholesterol (LDL <100). 3. Compliance at present is estimated to be excellent. Efforts to improve compliance (if necessary) will be directed at increased  exercise. 4. Follow up: 3 months I encouraged her to continue with present diet and exercise regimen as well as checking her blood sugars.  She is checking them before meals and 2 hours after meals.  We will also refer her to [facility]

## 2018-03-05 NOTE — Addendum Note (Signed)
Addended by: Renelda LomaHENRY, Goldia Ligman on: 03/05/2018 09:21 AM   Modules accepted: Orders

## 2018-06-11 ENCOUNTER — Ambulatory Visit: Payer: BC Managed Care – PPO | Admitting: Family Medicine

## 2018-06-12 ENCOUNTER — Encounter: Payer: Self-pay | Admitting: Family Medicine

## 2018-06-12 ENCOUNTER — Ambulatory Visit: Payer: BC Managed Care – PPO | Admitting: Family Medicine

## 2018-06-12 VITALS — BP 124/76 | HR 63 | Temp 97.5°F | Ht 67.0 in | Wt 206.7 lb

## 2018-06-12 DIAGNOSIS — E785 Hyperlipidemia, unspecified: Secondary | ICD-10-CM

## 2018-06-12 DIAGNOSIS — E119 Type 2 diabetes mellitus without complications: Secondary | ICD-10-CM | POA: Diagnosis not present

## 2018-06-12 DIAGNOSIS — G4730 Sleep apnea, unspecified: Secondary | ICD-10-CM | POA: Diagnosis not present

## 2018-06-12 DIAGNOSIS — Z23 Encounter for immunization: Secondary | ICD-10-CM

## 2018-06-12 DIAGNOSIS — E1169 Type 2 diabetes mellitus with other specified complication: Secondary | ICD-10-CM

## 2018-06-12 LAB — POCT GLYCOSYLATED HEMOGLOBIN (HGB A1C): Hemoglobin A1C: 7.5 % — AB (ref 4.0–5.6)

## 2018-06-12 NOTE — Progress Notes (Signed)
  Subjective:    Patient ID: Karen Hicks, female    DOB: 1971-05-26, 47 y.o.   MRN: [id number]  Karen Hicks is a 47 y.o. female who presents for follow-up of Type 2 diabetes mellitus.  Patient is checking home blood sugars.   Home blood sugar records: meter record avg am 123-130 mid day 120-150 How often is blood sugars being checked:Bid  Current symptoms/problems include being thristy and have been going on for the last three weeks. Daily foot checks: yes   Any foot concerns: dryness and foot pain  Last eye exam: dec 2019 Exercise: walking qd yard work She continues on Nexium and can usually take this every other day but does take it more often when she has symptoms that a lot of times she relates to her eating habits.  Her headaches are under good control.  Presently she is not on a blood pressure medication.  She continue with her diet and exercise has not lost any weight but has definitely lost some inches.  She has a remote history of OSA and apparently in the past was on CPAP but did lose some weight.  She has not been tested in over 10 years.  She continues on Crestor. The following portions of the patient's history were reviewed and updated as appropriate: allergies, current medications, past medical history, past social history and problem list.  ROS as in subjective above.     Objective:    Physical Exam Alert and in no distress otherwise not examined. A1c is 7.5  Lab Review Diabetic Labs Latest Ref Rng & Units 03/05/2018 10/22/2017 07/25/2016 03/13/2015 12/14/2014  HbA1c 4.0 - 5.6 % 7.6(A) 7.1(H) - - -  Chol 100 - 199 mg/dL - 409(W) - - -  HDL >11 mg/dL - 43 - - -  Calc LDL 0 - 99 mg/dL - 914(N) - - -  Triglycerides 0 - 149 mg/dL - 829(F) - - -  Creatinine 0.57 - 1.00 mg/dL - 6.21 3.08 6.57 8.46   BP/Weight 03/05/2018 12/31/2017 11/08/2017 10/25/2017 10/22/2017  Systolic BP 132 142 142 130 142  Diastolic BP 84 88 85 82 86  Wt. (Lbs) 207.6 210 208 206.4 206.6    BMI 32.51 32.89 31.89 32.33 32.36   No flowsheet data found.  Karen Hicks  reports that she has never smoked. She has never used smokeless tobacco. She reports that she drinks alcohol. She reports that she does not use drugs.     Assessment & Plan:    Type 2 diabetes mellitus without complication, without long-term current use of insulin (HCC) - Plan: POCT glycosylated hemoglobin (Hb A1C)  Hyperlipidemia associated with type 2 diabetes mellitus (HCC)  Need for influenza vaccination - Plan: Flu Vaccine QUAD 6+ mos PF IM (Fluarix Quad PF)  Sleep apnea, unspecified type - Plan: Home sleep test   1. Rx changes: none 2. Education: Reviewed 'ABCs' of diabetes management (respective goals in parentheses):  A1C (<7), blood pressure (<130/80), and cholesterol (LDL <100). 3. Compliance at present is estimated to be good. Efforts to improve compliance (if necessary) will be directed at increased exercise. 4. Follow up: 4 months She will become involved in a research project through [facility]  Also discussed adding an ACE inhibitor at some point in the near future. Since she has not had any follow-up on her OSA in quite some time I think is reasonable to readdress that.

## 2018-07-15 ENCOUNTER — Ambulatory Visit (HOSPITAL_BASED_OUTPATIENT_CLINIC_OR_DEPARTMENT_OTHER): Payer: BC Managed Care – PPO

## 2018-07-18 ENCOUNTER — Ambulatory Visit (HOSPITAL_BASED_OUTPATIENT_CLINIC_OR_DEPARTMENT_OTHER): Payer: BC Managed Care – PPO | Attending: Family Medicine | Admitting: Internal Medicine

## 2018-07-18 DIAGNOSIS — G4733 Obstructive sleep apnea (adult) (pediatric): Secondary | ICD-10-CM | POA: Insufficient documentation

## 2018-08-06 DIAGNOSIS — G4733 Obstructive sleep apnea (adult) (pediatric): Secondary | ICD-10-CM

## 2018-08-06 NOTE — Procedures (Signed)
   Patient Name: Karen Hicks, Karen Hicks Study Date: 07/19/2018 Gender: Female D.O.B: 21-Feb-1971 Age (years): 46 Referring Provider: Ronnald NianJohn C Lalonde Height (inches): 67 Interpreting Physician: Jetty Duhamellinton Damontae Loppnow MD, ABSM Weight (lbs): 209 RPSGT: [place], [place] BMI: 33 MRN: [id number] Neck Size: 15.00  CLINICAL INFORMATION Sleep Study Type: HST Indication for sleep study: OSA Epworth Sleepiness Score: 17  SLEEP STUDY TECHNIQUE A multi-channel overnight portable sleep study was performed. The channels recorded were: nasal airflow, thoracic respiratory movement, and oxygen saturation with a pulse oximetry. Snoring was also monitored.  MEDICATIONS Patient self administered medications include: none reported.  SLEEP ARCHITECTURE Patient was studied for 509.3 minutes. The sleep efficiency was 100.0 % and the patient was supine for 98.7%. The arousal index was 0.0 per hour.  RESPIRATORY PARAMETERS The overall AHI was 8.8 per hour, with a central apnea index of 0.0 per hour. The oxygen nadir was 83% during sleep.  CARDIAC DATA Mean heart rate during sleep was 58.6 bpm.  IMPRESSIONS - Mild obstructive sleep apnea occurred during this study (AHI = 8.8/h). - No significant central sleep apnea occurred during this study (CAI = 0.0/h). - Moderate oxygen desaturation was noted during this study (Min O2 = 83%). Mean 96%. - Patient snored.  DIAGNOSIS - Obstructive Sleep Apnea (327.23 [G47.33 ICD-10])  RECOMMENDATIONS - Treatment for mild OSA is directed at symptoms, with consideration of co-morbidities. Conservative measures might include observation, weight loss and sleep position off back. Other opptions, such as sleep medicine consultation, CPAP or a fitted oral appliance, would be based on clinical judgment. - Be careful with alcohol, sedatives and other CNS depressants that may worsen sleep apnea and disrupt normal sleep architecture. - Sleep hygiene should be reviewed to assess  factors that may improve sleep quality. - Weight management and regular exercise should be initiated or continued.  [Electronically signed] 08/06/2018 03:57 PM  Jetty Duhamellinton Temesgen Weightman MD, ABSM Diplomate, American Board of Sleep Medicine   NPI: [id number]                          Jetty Duhamellinton Caera Enwright Diplomate, American Board of Sleep Medicine  ELECTRONICALLY SIGNED ON:  08/06/2018, 3:47 PM [facility] PH: [phone]   FX: [phone] ACCREDITED BY THE AMERICAN ACADEMY OF SLEEP MEDICINE

## 2018-08-13 ENCOUNTER — Other Ambulatory Visit: Payer: Self-pay | Admitting: Orthopaedic Surgery

## 2018-08-13 NOTE — H&P (Signed)
Karen Hicks is an 47 y.o. female.   Chief Complaint: Left shoulder pain HPI: Karen Hicks has recurrent pain in her left shoulder.  She wonders if we can fix it as we seemingly have done on the right.  She is 6 months out from her right shoulder arthroscopy which included an acromioplasty and AC resection.  She has no pain there.  Unfortunately on the left she has difficulty sleeping on this side and using her arm up and overhead.  She says it feels identical to how the right side felt preoperatively.  We have injected the shoulder in the past.    Past Medical History:  Diagnosis Date  . Allergy   . Anemia    when she was young  . Arthritis   . Complication of anesthesia    takes her alittle longer to wake up  . GERD (gastroesophageal reflux disease)   . Irritable bowel   . Lactose intolerance   . Migraine   . PE (pulmonary embolism)    in 1994 meniscus repair.  Saw Dr. Susann GivensLaLonde  . Pneumonia   . PONV (postoperative nausea and vomiting)   . Restless legs syndrome   . Sleep apnea    2007.  Uses the mask & machine when she needs it.  Dr. Susann GivensLalonde is aware    Past Surgical History:  Procedure Laterality Date  . ABDOMINAL HYSTERECTOMY  01/2006   FIBROIDS  . CESAREAN SECTION    . HARDWARE REMOVAL Left 08/03/2014   Procedure: HARDWARE REMOVAL;  Surgeon: Velna OchsPeter G Dalldorf, MD;  Location: [facility] OR;  Service: Orthopedics;  Laterality: Left;  . HARDWARE REMOVAL Right 07/31/2016   Procedure: HARDWARE REMOVAL;  Surgeon: Marcene CorningPeter Dalldorf, MD;  [facility];  Service: Orthopedics;  Laterality: Right;  . KNEE SURGERY Bilateral    multiple  . 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 right wrist arthroscopy  2009  . TOTAL KNEE ARTHROPLASTY Left 08/03/2014   dr Jerl Santosdalldorf  . TOTAL KNEE ARTHROPLASTY Left 08/03/2014   Procedure: LEFT TOTAL KNEE ARTHROPLASTY;  Surgeon: Velna OchsPeter G Dalldorf, MD;  Location: [facility] OR;  Service:  Orthopedics;  Laterality: Left;  . TOTAL KNEE ARTHROPLASTY Right 07/31/2016   Procedure: TOTAL KNEE ARTHROPLASTY;  Surgeon: Marcene CorningPeter Dalldorf, MD;  Location: [facility] OR;  Service: Orthopedics;  Laterality: Right;    Family History  Problem Relation Age of Onset  . Arthritis Mother   . Diabetes Mother   . Heart disease Mother   . Hypertension Mother   . Kidney disease Mother   . Stroke Mother   . Gallbladder disease Mother   . Glaucoma Mother   . Seizures Mother   . Migraines Mother   . Arthritis Father   . Diabetes Father   . Hypertension Father   . Kidney disease Father   . Gallbladder disease Father   . Migraines Sister   . Migraines Brother   . Healthy Son    Social History:  reports that she has never smoked. She has never used smokeless tobacco. She reports that she drinks alcohol. She reports that she does not use drugs.  Allergies:  Allergies  Allergen Reactions  . Mushroom Extract Complex Anaphylaxis  . Peach Flavor Anaphylaxis  . Adhesive [Tape] Hives  . Penicillins Hives and Rash    Tingling and shaking Has patient had a PCN reaction causing immediate rash, facial/tongue/throat swelling, SOB or lightheadedness with hypotension: Yes Has  patient had a PCN reaction causing severe rash involving mucus membranes or skin necrosis: Yes Has patient had a PCN reaction that required hospitalization No Has patient had a PCN reaction occurring within the last 10 years: Yes If all of the above answers are "NO", then may proceed with Cephalosporin use.     No medications prior to admission.    No results found for this or any previous visit (from the past 48 hour(s)). No results found.  Review of Systems  Musculoskeletal: Positive for joint pain.       Left shoulder pain  All other systems reviewed and are negative.   There were no vitals taken for this visit. Physical Exam  Constitutional: She is oriented to person, place, and time. She appears well-developed and  well-nourished.  HENT:  Head: Normocephalic and atraumatic.  Eyes: Pupils are equal, round, and reactive to light.  Neck: Normal range of motion.  Cardiovascular: Normal rate and regular rhythm.  Respiratory: Effort normal.  GI: Soft.  Musculoskeletal:  Left shoulder motion is full but she has impingement in both positions.  She also has some pain at the [facility] joint and some pain to cross chest maneuvers.  Cervical motion is full and pain-free.  There is no palpable lymphadenopathy.  Sensation and motor function are intact in her hands with palpable pulses on both sides.  She has good cuff strength though it is painful to both tested positions.    Neurological: She is alert and oriented to person, place, and time.  Skin: Skin is warm and dry.  Psychiatric: She has a normal mood and affect. Her behavior is normal. Judgment and thought content normal.     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  She would basically like this fixed.  I think we can save her the expense of an MRI scan as I am sure it is going to look the same as the findings on the opposite side.  She has responded well to an acromioplasty and AC resection on the right and I think she should consider that on the left.  I reviewed risk of anesthesia, infection, DVT.  She would like to go forward at some point in the coming month and we will try to get her on the schedule.  With her last surgery we kept her overnight at the outpatient surgery center due to her sleep apnea.  Karen Hicks, Karen Organ, PA-C 08/13/2018, 1:18 PM

## 2018-08-15 ENCOUNTER — Encounter: Payer: Self-pay | Admitting: Family Medicine

## 2018-08-15 ENCOUNTER — Ambulatory Visit: Payer: BC Managed Care – PPO | Admitting: Family Medicine

## 2018-08-15 VITALS — BP 130/88 | HR 83 | Temp 98.0°F | Wt 217.2 lb

## 2018-08-15 DIAGNOSIS — J209 Acute bronchitis, unspecified: Secondary | ICD-10-CM

## 2018-08-15 DIAGNOSIS — H6591 Unspecified nonsuppurative otitis media, right ear: Secondary | ICD-10-CM

## 2018-08-15 MED ORDER — AZITHROMYCIN 500 MG PO TABS: 500.0000 mg | ORAL_TABLET | Freq: Every day | ORAL | 0 refills | Status: DC

## 2018-08-15 NOTE — Pre-Procedure Instructions (Signed)
Karen Hicks  08/15/2018      [id number], [place] - [address] [facility] [address] [address] [address] [facility] [address] [place] [address] Phone: [phone] Fax: [phone]    Your procedure is scheduled on December 17th.  [facility] at 1:00 P.M.  Call this number if you have problems the morning of surgery:  [phone]   Remember:  Do not eat or drink after midnight.    Take these medicines the morning of surgery with A SIP OF WATER  esomeprazole (NEXIUM) if needed rosuvastatin (CRESTOR)  7 days prior to surgery STOP taking any pseudoephedrine (SUDAFED), Aspirin(unless otherwise instructed by your surgeon), Aleve, Naproxen, Ibuprofen, Motrin, Advil, Goody's, BC's, all herbal medications, fish oil, and all vitamins     Do not wear jewelry, make-up or nail polish.  Do not wear lotions, powders, or perfumes, or deodorant.  Do not shave 48 hours prior to surgery.  Men may shave face and neck.  Do not bring valuables to the hospital.  [facility] is not responsible for any belongings or valuables.  Contacts, dentures or bridgework may not be worn into surgery.  Leave your suitcase in the car.  After surgery it may be brought to your room.  For patients admitted to the hospital, discharge time will be determined by your treatment team.  Patients discharged the day of surgery will not be allowed to drive home.    [employer]- Preparing For Surgery  Before surgery, you can play an important role. Because skin is not sterile, your skin needs to be as free of germs as possible. You can reduce the number of germs on your skin by washing with CHG (chlorahexidine gluconate) Soap before surgery.  CHG is an antiseptic cleaner which kills germs and bonds with the skin to continue killing germs even after washing.    Oral Hygiene is also important to reduce your risk of infection.  Remember - BRUSH YOUR TEETH THE MORNING  OF SURGERY WITH YOUR REGULAR TOOTHPASTE  Please do not use if you have an allergy to CHG or antibacterial soaps. If your skin becomes reddened/irritated stop using the CHG.  Do not shave (including legs and underarms) for at least 48 hours prior to first CHG shower. It is OK to shave your face.  Please follow these instructions carefully.   1. Shower the NIGHT BEFORE SURGERY and the MORNING OF SURGERY with CHG.   2. If you chose to wash your hair, wash your hair first as usual with your normal shampoo.  3. After you shampoo, rinse your hair and body thoroughly to remove the shampoo.  4. Use CHG as you would any other liquid soap. You can apply CHG directly to the skin and wash gently with a scrungie or a clean washcloth.   5. Apply the CHG Soap to your body ONLY FROM THE NECK DOWN.  Do not use on open wounds or open sores. Avoid contact with your eyes, ears, mouth and genitals (private parts). Wash Face and genitals (private parts)  with your normal soap.  6. Wash thoroughly, paying special attention to the area where your surgery will be performed.  7. Thoroughly rinse your body with warm water from the neck down.  8. DO NOT shower/wash with your normal soap after using and rinsing off the CHG Soap.  9. Pat yourself dry with a CLEAN TOWEL.  10. Wear CLEAN PAJAMAS to bed the night before  surgery, wear comfortable clothes the morning of surgery  11. Place CLEAN SHEETS on your bed the night of your first shower and DO NOT SLEEP WITH PETS.    Day of Surgery:  Do not apply any deodorants/lotions.  Please wear clean clothes to the hospital/surgery center.   Remember to brush your teeth WITH YOUR REGULAR TOOTHPASTE.    Please read over the following fact sheets that you were given.

## 2018-08-15 NOTE — Progress Notes (Signed)
   Subjective:    Patient ID: Karen Hicks, female    DOB: 09-10-1970, 47 y.o.   MRN: [id number]  HPI She states that last Saturday she developed nasal congestion, rhinorrhea, fever, chills, sore throat and a cough that has now become productive.  She is now having difficulty with fatigue and wheezing.  She also complains of some right earache.   Review of Systems     Objective:   Physical Exam Alert and in no distress. Tympanic membrane on his right is slightly dull and vascular, left is normal, canals are normal. Pharyngeal area is normal. Neck is supple without adenopathy or thyromegaly. Cardiac exam shows a regular sinus rhythm without murmurs or gallops. Lungs are clear to auscultation.        Assessment & Plan:  Acute bronchitis, unspecified organism - Plan: azithromycin (ZITHROMAX) 500 MG tablet  Other nonsuppurative otitis media of right ear, unspecified chronicity She will call if she has further trouble.  Sharlot GowdaJohn Luis Sami, MD   08/15/2018

## 2018-08-18 ENCOUNTER — Encounter (HOSPITAL_COMMUNITY): Payer: Self-pay

## 2018-08-18 ENCOUNTER — Other Ambulatory Visit: Payer: Self-pay

## 2018-08-18 ENCOUNTER — Encounter (HOSPITAL_COMMUNITY)
Admission: RE | Admit: 2018-08-18 | Discharge: 2018-08-18 | Disposition: A | Discharge status: Home / Self Care | Payer: BC Managed Care – PPO | Source: Ambulatory Visit | Attending: Orthopaedic Surgery | Admitting: Orthopaedic Surgery

## 2018-08-18 DIAGNOSIS — M25812 Other specified joint disorders, left shoulder: Secondary | ICD-10-CM | POA: Insufficient documentation

## 2018-08-18 DIAGNOSIS — Z01818 Encounter for other preprocedural examination: Secondary | ICD-10-CM | POA: Diagnosis not present

## 2018-08-18 HISTORY — DX: Prediabetes: R73.03

## 2018-08-18 LAB — COMPREHENSIVE METABOLIC PANEL
ALT: 53 U/L — ABNORMAL HIGH (ref 0–44)
AST: 50 U/L — ABNORMAL HIGH (ref 15–41)
Albumin: 4 g/dL (ref 3.5–5.0)
Alkaline Phosphatase: 84 U/L (ref 38–126)
Anion gap: 12 (ref 5–15)
BUN: 9 mg/dL (ref 6–20)
CO2: 21 mmol/L — ABNORMAL LOW (ref 22–32)
Calcium: 9.1 mg/dL (ref 8.9–10.3)
Chloride: 106 mmol/L (ref 98–111)
Creatinine, Ser: 0.95 mg/dL (ref 0.44–1.00)
GFR calc Af Amer: >60 mL/min (ref >60)
GFR calc non Af Amer: >60 mL/min (ref >60)
Glucose, Bld: 134 mg/dL — ABNORMAL HIGH (ref 70–99)
Potassium: 4.1 mmol/L (ref 3.5–5.1)
Sodium: 139 mmol/L (ref 135–145)
Total Bilirubin: 0.8 mg/dL (ref 0.3–1.2)
Total Protein: 7.4 g/dL (ref 6.5–8.1)

## 2018-08-18 LAB — CBC
HCT: 47.8 % — ABNORMAL HIGH (ref 36.0–46.0)
Hemoglobin: 14.5 g/dL (ref 12.0–15.0)
MCH: 26.9 pg (ref 26.0–34.0)
MCHC: 30.3 g/dL (ref 30.0–36.0)
MCV: 88.7 fL (ref 80.0–100.0)
Platelets: 308 10*3/uL (ref 150–400)
RBC: 5.39 MIL/uL — ABNORMAL HIGH (ref 3.87–5.11)
RDW: 13.8 % (ref 11.5–15.5)
WBC: 6.3 10*3/uL (ref 4.0–10.5)
nRBC: 0 % (ref 0.0–0.2)

## 2018-08-18 LAB — HEMOGLOBIN A1C
Hgb A1c MFr Bld: 6.9 % — ABNORMAL HIGH (ref 4.8–5.6)
Mean Plasma Glucose: 151.33 mg/dL

## 2018-08-18 LAB — GLUCOSE, CAPILLARY: Glucose-Capillary: 142 mg/dL — ABNORMAL HIGH (ref 70–99)

## 2018-08-18 NOTE — Progress Notes (Signed)
PCP - Sharlot GowdaJohn Lalonde Cardiologist - Traci Turner  Chest x-ray - not needed patient has a cough but no fever EKG - 10/30/17 Stress Test - 10/2017 ECHO - 10/2017 Cardiac Cath - denies  Sleep Study - 08/2018 - positive but was told she didn't need a CPAP yet CPAP -   Fasting Blood Sugar - 105-140s - new diagnosis of pre-diabetic Checks Blood Sugar __1___ times a day  Anesthesia review: yes, patient did have surgery this past June.  Cardiac studies at the beginning of 2019  Patient denies shortness of breath, fever, and chest pain at PAT appointment   Patient verbalized understanding of instructions that were given to them at the PAT appointment. Patient was also instructed that they will need to review over the PAT instructions again at home before surgery.

## 2018-08-18 NOTE — Anesthesia Preprocedure Evaluation (Addendum)
Anesthesia Evaluation  Patient identified by MRN, date of birth, ID band Patient awake    Reviewed: Allergy & Precautions, NPO status , Patient's Chart, lab work & pertinent test results  History of Anesthesia Complications (+) PONV and history of anesthetic complications  Airway Mallampati: III  TM Distance: >3 FB Neck ROM: Full    Dental  (+) Dental Advisory Given   Pulmonary sleep apnea , pneumonia,    Pulmonary exam normal breath sounds clear to auscultation       Cardiovascular negative cardio ROS Normal cardiovascular exam Rhythm:Regular Rate:Normal     Neuro/Psych  Headaches, negative psych ROS   GI/Hepatic Neg liver ROS, GERD  ,  Endo/Other  diabetes  Renal/GU negative Renal ROS  negative genitourinary   Musculoskeletal  (+) Arthritis ,   Abdominal (+) + obese,   Peds negative pediatric ROS (+)  Hematology  (+) Blood dyscrasia, anemia ,   Anesthesia Other Findings   Reproductive/Obstetrics negative OB ROS                                                              Anesthesia Evaluation  Patient identified by MRN, date of birth, ID band Patient awake    Reviewed: Allergy & Precautions, NPO status , Patient's Chart, lab work & pertinent test results  History of Anesthesia Complications (+) PONV and history of anesthetic complications  Airway Mallampati: III  TM Distance: >3 FB Neck ROM: Full    Dental no notable dental hx. (+) Teeth Intact   Pulmonary sleep apnea and Continuous Positive Airway Pressure Ventilation , pneumonia, resolved,    Pulmonary exam normal breath sounds clear to auscultation       Cardiovascular negative cardio ROS Normal cardiovascular exam Rhythm:Regular Rate:Normal     Neuro/Psych  Headaches, Restless legs syndrome negative psych ROS   GI/Hepatic GERD  Medicated and Controlled,  Endo/Other    Renal/GU   negative  genitourinary   Musculoskeletal  (+) Arthritis , OA right knee   Abdominal   Peds  Hematology  (+) anemia ,   Anesthesia Other Findings   Reproductive/Obstetrics                             Anesthesia Physical Anesthesia Plan  ASA: II  Anesthesia Plan: Regional and General   Post-op Pain Management:    Induction: Intravenous  Airway Management Planned: Oral ETT  Additional Equipment:   Intra-op Plan:   Post-operative Plan: Extubation in OR  Informed Consent: I have reviewed the patients History and Physical, chart, labs and discussed the procedure including the risks, benefits and alternatives for the proposed anesthesia with the patient or authorized representative who has indicated his/her understanding and acceptance.   Dental advisory given  Plan Discussed with: CRNA, Surgeon and Anesthesiologist  Anesthesia Plan Comments:        Anesthesia Quick Evaluation                                   Anesthesia Evaluation  Patient identified by MRN, date of birth, ID band Patient awake    Reviewed: Allergy & Precautions, H&P , NPO status , Patient's Chart, lab work &  pertinent test results, reviewed documented beta blocker date and time   History of Anesthesia Complications (+) PONV  Airway Mallampati: II       Dental  (+) Edentulous Upper   Pulmonary sleep apnea ,  breath sounds clear to auscultation        Cardiovascular Rhythm:Regular  EKG WNL   Neuro/Psych    GI/Hepatic GERD-  ,  Endo/Other    Renal/GU      Musculoskeletal   Abdominal (+)  Abdomen: soft.    Peds  Hematology   Anesthesia Other Findings   Reproductive/Obstetrics                            Anesthesia Physical Anesthesia Plan  ASA: III  Anesthesia Plan: Spinal   Post-op Pain Management: MAC Combined w/ Regional for Post-op pain   Induction: Intravenous  Airway Management Planned: LMA and Oral  ETT  Additional Equipment:   Intra-op Plan:   Post-operative Plan: Extubation in OR  Informed Consent: I have reviewed the patients History and Physical, chart, labs and discussed the procedure including the risks, benefits and alternatives for the proposed anesthesia with the patient or authorized representative who has indicated his/her understanding and acceptance.     Plan Discussed with:   Anesthesia Plan Comments:        Anesthesia Quick Evaluation  Anesthesia Physical Anesthesia Plan  ASA: II  Anesthesia Plan: General   Post-op Pain Management: GA combined w/ Regional for post-op pain   Induction: Intravenous  PONV Risk Score and Plan: 4 or greater and Ondansetron, Dexamethasone, Midazolam and Treatment may vary due to age or medical condition  Airway Management Planned: Oral ETT  Additional Equipment:   Intra-op Plan:   Post-operative Plan: Extubation in OR  Informed Consent: I have reviewed the patients History and Physical, chart, labs and discussed the procedure including the risks, benefits and alternatives for the proposed anesthesia with the patient or authorized representative who has indicated his/her understanding and acceptance.   Dental advisory given  Plan Discussed with: CRNA  Anesthesia Plan Comments: (PAT note written by Myra Gianotti, PA-C)       Anesthesia Quick Evaluation

## 2018-08-18 NOTE — Progress Notes (Signed)
Anesthesia Chart Review:  Case:  [id number] Date/Time:  08/19/18 1437   Procedure:  ARTHROSCOPY SHOULDER (Left )   Anesthesia type:  Choice   Pre-op diagnosis:  LEFT SHOULDER IMPINGEMENT AND AC PAIN   [facility]  [facility] 05 / [facility]   Surgeon:  Marcene Corning, MD      DISCUSSION: Patient is a 47 year old female scheduled for the above procedure.   History includes never smoker, post-operative N/V, GERD, PE '94, OSA ("mild" 07/18/18), pre-diabetes, IBS. BMI is consistent with obesity. Reports it "takes her a little longer to wake up" after anesthesia. She was treated last week with Zithromax for acute bronchitis, but reported told her PAT RN that she was doing better and did not appear ill at her visit. No persistent coughing. No fever.    She had no ischemic on stress echo in March 2019.  Anesthesiologist to evaluate on the day of surgery, but based on currently available information, I would anticipate that she can proceed as planned if no acute changes.   VS: BP 126/75   Pulse 82   Temp 36.8 C   Resp 20   Ht 5\' 7"  (1.702 m)   Wt 92 kg   SpO2 96%   BMI 31.78 kg/m     PROVIDERS: Ronnald Nian, MD is PCP Armanda Magic, MD is cardiologist. Last visit 10/25/17 for evaluation for chest pain felt to be atypical. She had a stress echo that was reassuring (see below).    LABS: Preoperative labs noted. Cr 0.95, H/H 14.5/47.8. AST/ALT mildly elevated ~ 50 (stable when compared to 10/22/17 labs). A1c 6.9. Overall, labs acceptable for OR.  (all labs ordered are listed, but only abnormal results are displayed)  Labs Reviewed  GLUCOSE, CAPILLARY - Abnormal; Notable for the following components:      Result Value   Glucose-Capillary 142 (*)    All other components within normal limits  HEMOGLOBIN A1C - Abnormal; Notable for the following components:   Hgb A1c MFr Bld 6.9 (*)    All other components within normal limits  COMPREHENSIVE METABOLIC PANEL - Abnormal; Notable for the  following components:   CO2 21 (*)    Glucose, Bld 134 (*)    AST 50 (*)    ALT 53 (*)    All other components within normal limits  CBC - Abnormal; Notable for the following components:   RBC 5.39 (*)    HCT 47.8 (*)    All other components within normal limits    SLEEP STUDY 07/18/18: IMPRESSIONS - Mild obstructive sleep apnea occurred during this study (AHI = 8.8/h). - No significant central sleep apnea occurred during this study (CAI = 0.0/h). - Moderate oxygen desaturation was noted during this study (Min O2 = 83%). Mean 96%. - Patient snored. RECOMMENDATIONS - Treatment for mild OSA is directed at symptoms, with consideration of co-morbidities. Conservative measures might include observation, weight loss and sleep position off back. Other opptions, such as sleep medicine consultation, CPAP or a fitted oral appliance, would be based on clinical judgment. - Be careful with alcohol, sedatives and other CNS depressants that may worsen sleep apnea and disrupt normal sleep architecture. - Sleep hygiene should be reviewed to assess factors that may improve sleep quality. - Weight management and regular exercise should be initiated or continued.   EKG: 10/25/17: NSR, minimal voltage criteria for LVH, may be normal variant. Non-specific ST/T wave abnormality.   CV: Stress echo 11/12/17: Study Conclusions -  Stress ECG conclusions: There were no stress arrhythmias or   conduction abnormalities. The stress ECG was consistent with   myocardial ischemia. - Staged echo: There was no echocardiographic evidence for   stress-induced ischemia. Impressions: - Stress echocardiogram with no chest pain; significant artifact on   ECGs but there appears to be 2 mm ST depression in the inferior   lateral leads with peak exertion (positive ECG abnormalities);   however no stress-induced wall motion abnormalities noted on echo   images. (Reviewed by Dr. Mayford Knifeurner, "False positive EKG and no ischemia  on stress echo.")  Echo 11/12/17: Study Conclusions - Left ventricle: The cavity size was normal. Wall thickness was   normal. Systolic function was normal. The estimated ejection   fraction was in the range of 55% to 60%. Wall motion was normal;   there were no regional wall motion abnormalities. The study is   not technically sufficient to allow evaluation of LV diastolic   function. Impressions: - Normal LV function; no significant valvular disease.   Past Medical History:  Diagnosis Date  . Allergy   . Anemia    when she was young  . Arthritis   . Complication of anesthesia    takes her alittle longer to wake up  . GERD (gastroesophageal reflux disease)   . Irritable bowel   . Lactose intolerance   . Migraine   . PE (pulmonary embolism)    in 1994 meniscus repair.  Saw Dr. Susann GivensLaLonde  . Pneumonia   . PONV (postoperative nausea and vomiting)   . Pre-diabetes   . Restless legs syndrome   . Sleep apnea    2007.  Uses the mask & machine when she needs it.  Dr. Susann GivensLalonde is aware    Past Surgical History:  Procedure Laterality Date  . ABDOMINAL HYSTERECTOMY  01/2006   FIBROIDS  . CESAREAN SECTION    . HARDWARE REMOVAL Left 08/03/2014   Procedure: HARDWARE REMOVAL;  Surgeon: Velna OchsPeter G Dalldorf, MD;  Location[facility];  Service: Orthopedics;  Laterality: Left;  . HARDWARE REMOVAL Right 07/31/2016   Procedure: HARDWARE REMOVAL;  Surgeon: Marcene CorningPeter Dalldorf, MD;  [facility];  Service: Orthopedics;  Laterality: Right;  . KNEE SURGERY Bilateral    multiple  . 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 right wrist arthroscopy  2009  . TOTAL KNEE ARTHROPLASTY Left 08/03/2014   dr Jerl Santosdalldorf  . TOTAL KNEE ARTHROPLASTY Left 08/03/2014   Procedure: LEFT TOTAL KNEE ARTHROPLASTY;  Surgeon: Velna OchsPeter G Dalldorf, MD;  [facility];  Service: Orthopedics;  Laterality: Left;  . TOTAL KNEE ARTHROPLASTY Right  07/31/2016   Procedure: TOTAL KNEE ARTHROPLASTY;  Surgeon: Marcene CorningPeter Dalldorf, MD;  [facility];  Service: Orthopedics;  Laterality: Right;    MEDICATIONS: . azithromycin (ZITHROMAX) 500 MG tablet  . Colloidal Oatmeal (AVEENO ECZEMA THERAPY EX)  . Dextromethorphan-guaiFENesin (MUCINEX DM MAXIMUM STRENGTH) 60-1200 MG TB12  . esomeprazole (NEXIUM) 20 MG capsule  . glucose blood test strip  . nitroGLYCERIN (NITROSTAT) 0.4 MG SL tablet  . phenol (CHLORASEPTIC) 1.4 % LIQD  . pseudoephedrine (SUDAFED) 30 MG tablet  . rosuvastatin (CRESTOR) 40 MG tablet   . amitriptyline (ELAVIL) tablet 25 mg    Velna Ochsllison Jailee Jaquez, PA-C [facility]/Anesthesiology Phone [phone] 08/18/2018 2:30 PM

## 2018-08-18 NOTE — Pre-Procedure Instructions (Signed)
Karen Hicks  08/18/2018      [id number] [address], [place] [facility] [address] [facility] [address] [place] [address] Phone: [phone] Fax: [phone]    Your procedure is scheduled on December 17th.  [facility] at 1:00 P.M.  Call this number if you have problems the morning of surgery:  [phone]   Remember:  Do not eat or drink after midnight.    Take these medicines the morning of surgery with A SIP OF WATER  esomeprazole (NEXIUM) if needed azithromycin (ZITHROMAX) nitroGLYCERIN (NITROSTAT)   7 days prior to surgery STOP taking any pseudoephedrine (SUDAFED), Aspirin(unless otherwise instructed by your surgeon), Aleve, Naproxen, Ibuprofen, Motrin, Advil, Goody's, BC's, all herbal medications, fish oil, and all vitamins     Do not wear jewelry, make-up or nail polish.  Do not wear lotions, powders, or perfumes, or deodorant.  Do not shave 48 hours prior to surgery.    Do not bring valuables to the hospital.  [facility] is not responsible for any belongings or valuables.  Contacts, dentures or bridgework may not be worn into surgery.  Leave your suitcase in the car.  After surgery it may be brought to your room.  For patients admitted to the hospital, discharge time will be determined by your treatment team.  Patients discharged the day of surgery will not be allowed to drive home.    [place]- Preparing For Surgery  Before surgery, you can play an important role. Because skin is not sterile, your skin needs to be as free of germs as possible. You can reduce the number of germs on your skin by washing with CHG (chlorahexidine gluconate) Soap before surgery.  CHG is an antiseptic cleaner which kills germs and bonds with the skin to continue killing germs even after washing.    Oral Hygiene is also important to reduce your risk of infection.  Remember - BRUSH YOUR TEETH THE  MORNING OF SURGERY WITH YOUR REGULAR TOOTHPASTE  Please do not use if you have an allergy to CHG or antibacterial soaps. If your skin becomes reddened/irritated stop using the CHG.  Do not shave (including legs and underarms) for at least 48 hours prior to first CHG shower. It is OK to shave your face.  Please follow these instructions carefully.   1. Shower the NIGHT BEFORE SURGERY and the MORNING OF SURGERY with CHG.   2. If you chose to wash your hair, wash your hair first as usual with your normal shampoo.  3. After you shampoo, rinse your hair and body thoroughly to remove the shampoo.  4. Use CHG as you would any other liquid soap. You can apply CHG directly to the skin and wash gently with a scrungie or a clean washcloth.   5. Apply the CHG Soap to your body ONLY FROM THE NECK DOWN.  Do not use on open wounds or open sores. Avoid contact with your eyes, ears, mouth and genitals (private parts). Wash Face and genitals (private parts)  with your normal soap.  6. Wash thoroughly, paying special attention to the area where your surgery will be performed.  7. Thoroughly rinse your body with warm water from the neck down.  8. DO NOT shower/wash with your normal soap after using and rinsing off the CHG Soap.  9. Pat yourself dry with a CLEAN TOWEL.  10. Wear CLEAN PAJAMAS to bed the night before surgery, wear  comfortable clothes the morning of surgery  11. Place CLEAN SHEETS on your bed the night of your first shower and DO NOT SLEEP WITH PETS.    Day of Surgery:  Do not apply any deodorants/lotions.  Please wear clean clothes to the hospital/surgery center.   Remember to brush your teeth WITH YOUR REGULAR TOOTHPASTE.    Please read over the following fact sheets that you were given.

## 2018-08-19 ENCOUNTER — Encounter (HOSPITAL_BASED_OUTPATIENT_CLINIC_OR_DEPARTMENT_OTHER): Payer: Self-pay

## 2018-08-19 ENCOUNTER — Ambulatory Visit (HOSPITAL_BASED_OUTPATIENT_CLINIC_OR_DEPARTMENT_OTHER)
Admission: RE | Admit: 2018-08-19 | Discharge: 2018-08-19 | Disposition: A | Discharge status: Home / Self Care | Payer: BC Managed Care – PPO | Source: Ambulatory Visit | Attending: Orthopaedic Surgery | Admitting: Orthopaedic Surgery

## 2018-08-19 ENCOUNTER — Ambulatory Visit (HOSPITAL_BASED_OUTPATIENT_CLINIC_OR_DEPARTMENT_OTHER): Payer: BC Managed Care – PPO | Admitting: Certified Registered Nurse Anesthetist

## 2018-08-19 ENCOUNTER — Other Ambulatory Visit: Payer: Self-pay

## 2018-08-19 ENCOUNTER — Encounter (HOSPITAL_BASED_OUTPATIENT_CLINIC_OR_DEPARTMENT_OTHER)
Admission: RE | Disposition: A | Discharge status: Home / Self Care | Payer: Self-pay | Source: Ambulatory Visit | Attending: Orthopaedic Surgery

## 2018-08-19 ENCOUNTER — Ambulatory Visit (HOSPITAL_BASED_OUTPATIENT_CLINIC_OR_DEPARTMENT_OTHER): Payer: BC Managed Care – PPO | Admitting: Vascular Surgery

## 2018-08-19 DIAGNOSIS — M7542 Impingement syndrome of left shoulder: Secondary | ICD-10-CM | POA: Diagnosis not present

## 2018-08-19 DIAGNOSIS — K219 Gastro-esophageal reflux disease without esophagitis: Secondary | ICD-10-CM | POA: Insufficient documentation

## 2018-08-19 DIAGNOSIS — Z79899 Other long term (current) drug therapy: Secondary | ICD-10-CM | POA: Insufficient documentation

## 2018-08-19 DIAGNOSIS — M25512 Pain in left shoulder: Secondary | ICD-10-CM | POA: Diagnosis not present

## 2018-08-19 DIAGNOSIS — G4730 Sleep apnea, unspecified: Secondary | ICD-10-CM | POA: Insufficient documentation

## 2018-08-19 DIAGNOSIS — Z888 Allergy status to other drugs, medicaments and biological substances status: Secondary | ICD-10-CM | POA: Diagnosis not present

## 2018-08-19 DIAGNOSIS — Z86711 Personal history of pulmonary embolism: Secondary | ICD-10-CM | POA: Insufficient documentation

## 2018-08-19 DIAGNOSIS — Z880 Allergy status to penicillin: Secondary | ICD-10-CM | POA: Insufficient documentation

## 2018-08-19 DIAGNOSIS — G2581 Restless legs syndrome: Secondary | ICD-10-CM | POA: Insufficient documentation

## 2018-08-19 DIAGNOSIS — K589 Irritable bowel syndrome without diarrhea: Secondary | ICD-10-CM | POA: Diagnosis not present

## 2018-08-19 SURGERY — SHOULDER ARTHROSCOPY WITH SUBACROMIAL DECOMPRESSION AND DISTAL CLAVICLE EXCISION
Anesthesia: General | Site: Shoulder | Laterality: Left

## 2018-08-19 MED ORDER — BUPIVACAINE HCL (PF) 0.25 % IJ SOLN
INTRAMUSCULAR | Status: AC
  Filled 2018-08-19: qty 30

## 2018-08-19 MED ORDER — OXYCODONE HCL 5 MG/5ML PO SOLN: 5.0000 mg | Freq: Once | ORAL | Status: DC | PRN

## 2018-08-19 MED ORDER — LIDOCAINE 2% (20 MG/ML) 5 ML SYRINGE
INTRAMUSCULAR | Status: AC
  Filled 2018-08-19: qty 5

## 2018-08-19 MED ORDER — LACTATED RINGERS IV SOLN
INTRAVENOUS | Status: DC
  Administered 2018-08-19 (×2): via INTRAVENOUS

## 2018-08-19 MED ORDER — BUPIVACAINE HCL (PF) 0.5 % IJ SOLN
INTRAMUSCULAR | Status: DC | PRN
  Administered 2018-08-19: 20 mL via PERINEURAL

## 2018-08-19 MED ORDER — ALBUTEROL SULFATE (2.5 MG/3ML) 0.083% IN NEBU
INHALATION_SOLUTION | RESPIRATORY_TRACT | Status: AC
  Filled 2018-08-19: qty 3

## 2018-08-19 MED ORDER — OXYCODONE HCL 5 MG PO TABS: 5.0000 mg | ORAL_TABLET | Freq: Once | ORAL | Status: DC | PRN

## 2018-08-19 MED ORDER — BUPIVACAINE HCL (PF) 0.5 % IJ SOLN
INTRAMUSCULAR | Status: AC
  Filled 2018-08-19: qty 30

## 2018-08-19 MED ORDER — PROPOFOL 10 MG/ML IV BOLUS
INTRAVENOUS | Status: DC | PRN
  Administered 2018-08-19: 200 mg via INTRAVENOUS

## 2018-08-19 MED ORDER — ROCURONIUM BROMIDE 50 MG/5ML IV SOSY
PREFILLED_SYRINGE | INTRAVENOUS | Status: DC | PRN
  Administered 2018-08-19: 40 mg via INTRAVENOUS

## 2018-08-19 MED ORDER — LACTATED RINGERS IV SOLN: INTRAVENOUS | Status: DC

## 2018-08-19 MED ORDER — ALBUTEROL SULFATE (2.5 MG/3ML) 0.083% IN NEBU
2.5000 mg | INHALATION_SOLUTION | Freq: Four times a day (QID) | RESPIRATORY_TRACT | Status: DC | PRN
  Administered 2018-08-19: 2.5 mg via RESPIRATORY_TRACT

## 2018-08-19 MED ORDER — FENTANYL CITRATE (PF) 100 MCG/2ML IJ SOLN
50.0000 ug | INTRAMUSCULAR | Status: AC | PRN
  Administered 2018-08-19 (×3): 50 ug via INTRAVENOUS

## 2018-08-19 MED ORDER — PROPOFOL 10 MG/ML IV BOLUS
INTRAVENOUS | Status: AC
  Filled 2018-08-19: qty 20

## 2018-08-19 MED ORDER — VANCOMYCIN HCL IN DEXTROSE 1-5 GM/200ML-% IV SOLN
INTRAVENOUS | Status: AC
  Filled 2018-08-19: qty 200

## 2018-08-19 MED ORDER — FENTANYL CITRATE (PF) 100 MCG/2ML IJ SOLN
INTRAMUSCULAR | Status: AC
  Filled 2018-08-19: qty 2

## 2018-08-19 MED ORDER — CLONIDINE HCL (ANALGESIA) 100 MCG/ML EP SOLN
EPIDURAL | Status: DC | PRN
  Administered 2018-08-19: 50 ug

## 2018-08-19 MED ORDER — ALBUTEROL SULFATE HFA 108 (90 BASE) MCG/ACT IN AERS
INHALATION_SPRAY | RESPIRATORY_TRACT | Status: AC
  Filled 2018-08-19: qty 6.7

## 2018-08-19 MED ORDER — LIDOCAINE 2% (20 MG/ML) 5 ML SYRINGE
INTRAMUSCULAR | Status: DC | PRN
  Administered 2018-08-19: 100 mg via INTRAVENOUS

## 2018-08-19 MED ORDER — MIDAZOLAM HCL 2 MG/2ML IJ SOLN
INTRAMUSCULAR | Status: AC
  Filled 2018-08-19: qty 2

## 2018-08-19 MED ORDER — SCOPOLAMINE 1 MG/3DAYS TD PT72: 1.0000 | MEDICATED_PATCH | Freq: Once | TRANSDERMAL | Status: DC | PRN

## 2018-08-19 MED ORDER — ONDANSETRON HCL 4 MG/2ML IJ SOLN
INTRAMUSCULAR | Status: DC | PRN
  Administered 2018-08-19: 4 mg via INTRAVENOUS

## 2018-08-19 MED ORDER — ONDANSETRON HCL 4 MG/2ML IJ SOLN
INTRAMUSCULAR | Status: AC
  Filled 2018-08-19: qty 2

## 2018-08-19 MED ORDER — EPHEDRINE 5 MG/ML INJ
INTRAVENOUS | Status: AC
  Filled 2018-08-19: qty 10

## 2018-08-19 MED ORDER — HYDROCODONE-ACETAMINOPHEN 5-325 MG PO TABS: 1.0000 | ORAL_TABLET | Freq: Four times a day (QID) | ORAL | 0 refills | Status: DC | PRN

## 2018-08-19 MED ORDER — ROCURONIUM BROMIDE 50 MG/5ML IV SOSY
PREFILLED_SYRINGE | INTRAVENOUS | Status: AC
  Filled 2018-08-19: qty 5

## 2018-08-19 MED ORDER — SODIUM CHLORIDE 0.9 % IR SOLN
Status: DC | PRN
  Administered 2018-08-19: 6000 mL

## 2018-08-19 MED ORDER — MEPERIDINE HCL 25 MG/ML IJ SOLN: 6.2500 mg | INTRAMUSCULAR | Status: DC | PRN

## 2018-08-19 MED ORDER — CHLORHEXIDINE GLUCONATE 4 % EX LIQD: 60.0000 mL | Freq: Once | CUTANEOUS | Status: DC

## 2018-08-19 MED ORDER — PROMETHAZINE HCL 12.5 MG PO TABS: 12.5000 mg | ORAL_TABLET | Freq: Four times a day (QID) | ORAL | 0 refills | Status: DC | PRN

## 2018-08-19 MED ORDER — METHYLPREDNISOLONE ACETATE 80 MG/ML IJ SUSP
INTRAMUSCULAR | Status: AC
  Filled 2018-08-19: qty 1

## 2018-08-19 MED ORDER — PROMETHAZINE HCL 25 MG/ML IJ SOLN: 6.2500 mg | INTRAMUSCULAR | Status: DC | PRN

## 2018-08-19 MED ORDER — VANCOMYCIN HCL IN DEXTROSE 1-5 GM/200ML-% IV SOLN
1000.0000 mg | INTRAVENOUS | Status: AC
  Administered 2018-08-19: 1000 mg via INTRAVENOUS

## 2018-08-19 MED ORDER — DEXAMETHASONE SODIUM PHOSPHATE 10 MG/ML IJ SOLN
INTRAMUSCULAR | Status: AC
  Filled 2018-08-19: qty 1

## 2018-08-19 MED ORDER — BUPIVACAINE-EPINEPHRINE (PF) 0.25% -1:200000 IJ SOLN
INTRAMUSCULAR | Status: AC
  Filled 2018-08-19: qty 30

## 2018-08-19 MED ORDER — MIDAZOLAM HCL 2 MG/2ML IJ SOLN
1.0000 mg | INTRAMUSCULAR | Status: DC | PRN
  Administered 2018-08-19: 2 mg via INTRAVENOUS

## 2018-08-19 MED ORDER — FENTANYL CITRATE (PF) 100 MCG/2ML IJ SOLN: 25.0000 ug | INTRAMUSCULAR | Status: DC | PRN

## 2018-08-19 MED ORDER — DEXAMETHASONE SODIUM PHOSPHATE 10 MG/ML IJ SOLN
INTRAMUSCULAR | Status: DC | PRN
  Administered 2018-08-19: 10 mg via INTRAVENOUS

## 2018-08-19 SURGICAL SUPPLY — 75 items
ADH SKN CLS APL DERMABOND .7 (GAUZE/BANDAGES/DRESSINGS)
AID PSTN UNV HD RSTRNT DISP (MISCELLANEOUS) ×2
APL SKNCLS STERI-STRIP NONHPOA (GAUZE/BANDAGES/DRESSINGS)
BENZOIN TINCTURE PRP APPL 2/3 (GAUZE/BANDAGES/DRESSINGS) IMPLANT
BLADE CUDA 5.5 (BLADE) IMPLANT
BLADE GREAT WHITE 4.2 (BLADE) ×3 IMPLANT
BLADE SURG 15 STRL LF DISP TIS (BLADE) IMPLANT
BLADE SURG 15 STRL SS (BLADE)
BUR VERTEX HOODED 4.5 (BURR) ×2 IMPLANT
CANNULA SHOULDER 7CM (CANNULA) ×3 IMPLANT
CANNULA TWIST IN 8.25X7CM (CANNULA) IMPLANT
COVER WAND RF STERILE (DRAPES) IMPLANT
DECANTER SPIKE VIAL GLASS SM (MISCELLANEOUS) IMPLANT
DERMABOND ADVANCED (GAUZE/BANDAGES/DRESSINGS)
DERMABOND ADVANCED .7 DNX12 (GAUZE/BANDAGES/DRESSINGS) IMPLANT
DRAPE STERI 35X30 U-POUCH (DRAPES) ×3 IMPLANT
DRAPE U-SHAPE 47X51 STRL (DRAPES) ×3 IMPLANT
DRAPE U-SHAPE 76X120 STRL (DRAPES) ×6 IMPLANT
DRSG EMULSION OIL 3X3 NADH (GAUZE/BANDAGES/DRESSINGS) ×3 IMPLANT
DRSG PAD ABDOMINAL 8X10 ST (GAUZE/BANDAGES/DRESSINGS) ×3 IMPLANT
DURAPREP 26ML APPLICATOR (WOUND CARE) ×3 IMPLANT
ELECT MENISCUS 165MM 90D (ELECTRODE) IMPLANT
ELECT REM PT RETURN 9FT ADLT (ELECTROSURGICAL)
ELECTRODE REM PT RTRN 9FT ADLT (ELECTROSURGICAL) ×1 IMPLANT
GAUZE SPONGE 4X4 12PLY STRL (GAUZE/BANDAGES/DRESSINGS) ×3 IMPLANT
GLOVE BIO SURGEON STRL SZ8 (GLOVE) ×6 IMPLANT
GLOVE BIOGEL PI IND STRL 7.0 (GLOVE) ×1 IMPLANT
GLOVE BIOGEL PI IND STRL 8 (GLOVE) ×6 IMPLANT
GLOVE BIOGEL PI INDICATOR 7.0 (GLOVE) ×1
GLOVE BIOGEL PI INDICATOR 8 (GLOVE) ×4
GLOVE SURG SS PI 8.0 STRL IVOR (GLOVE) ×4 IMPLANT
GLOVE SURG SYN 8.0 (GLOVE) ×3 IMPLANT
GLOVE SURG SYN 8.0 PF PI (GLOVE) IMPLANT
GOWN STRL REIN XL XLG (GOWN DISPOSABLE) ×2 IMPLANT
GOWN STRL REUS W/ TWL LRG LVL3 (GOWN DISPOSABLE) ×3 IMPLANT
GOWN STRL REUS W/ TWL XL LVL3 (GOWN DISPOSABLE) ×4 IMPLANT
GOWN STRL REUS W/TWL LRG LVL3 (GOWN DISPOSABLE) ×3
GOWN STRL REUS W/TWL XL LVL3 (GOWN DISPOSABLE) ×6
MANIFOLD NEPTUNE II (INSTRUMENTS) ×3 IMPLANT
NDL SCORPION MULTI FIRE (NEEDLE) IMPLANT
NDL SUT 6 .5 CRC .975X.05 MAYO (NEEDLE) IMPLANT
NEEDLE MAYO TAPER (NEEDLE)
NEEDLE SCORPION MULTI FIRE (NEEDLE) IMPLANT
NS IRRIG 1000ML POUR BTL (IV SOLUTION) IMPLANT
PACK ARTHROSCOPY DSU (CUSTOM PROCEDURE TRAY) ×3 IMPLANT
PACK BASIN DAY SURGERY FS (CUSTOM PROCEDURE TRAY) ×3 IMPLANT
PASSER SUT SWANSON 36MM LOOP (INSTRUMENTS) IMPLANT
PENCIL BUTTON HOLSTER BLD 10FT (ELECTRODE) IMPLANT
PROBE BIPOLAR ATHRO 135MM 90D (MISCELLANEOUS) ×3 IMPLANT
RESTRAINT HEAD UNIVERSAL NS (MISCELLANEOUS) ×3 IMPLANT
SHEET MEDIUM DRAPE 40X70 STRL (DRAPES) ×5 IMPLANT
SLEEVE SCD COMPRESS KNEE MED (MISCELLANEOUS) ×2 IMPLANT
SLING ARM FOAM STRAP LRG (SOFTGOODS) ×2 IMPLANT
SLING ARM MED ADULT FOAM STRAP (SOFTGOODS) IMPLANT
SLING ARM SM FOAM STRAP (SOFTGOODS) IMPLANT
SLING ARM XL FOAM STRAP (SOFTGOODS) IMPLANT
SPONGE LAP 4X18 RFD (DISPOSABLE) IMPLANT
STRIP CLOSURE SKIN 1/2X4 (GAUZE/BANDAGES/DRESSINGS) IMPLANT
SUCTION FRAZIER HANDLE 10FR (MISCELLANEOUS)
SUCTION TUBE FRAZIER 10FR DISP (MISCELLANEOUS) IMPLANT
SUT ETHIBOND 2 OS 4 DA (SUTURE) IMPLANT
SUT ETHILON 3 0 PS 1 (SUTURE) ×3 IMPLANT
SUT FIBERWIRE #2 38 T-5 BLUE (SUTURE)
SUT PDS AB 2-0 CT2 27 (SUTURE) IMPLANT
SUT VIC AB 0 SH 27 (SUTURE) IMPLANT
SUT VIC AB 2-0 SH 27 (SUTURE)
SUT VIC AB 2-0 SH 27XBRD (SUTURE) IMPLANT
SUT VICRYL 4-0 PS2 18IN ABS (SUTURE) IMPLANT
SUTURE FIBERWR #2 38 T-5 BLUE (SUTURE) IMPLANT
SYR BULB 3OZ (MISCELLANEOUS) IMPLANT
TOWEL GREEN STERILE FF (TOWEL DISPOSABLE) ×3 IMPLANT
TOWEL OR NON WOVEN STRL DISP B (DISPOSABLE) ×1 IMPLANT
TUBING ARTHRO INFLOW-ONLY STRL (TUBING) ×3 IMPLANT
WATER STERILE IRR 1000ML POUR (IV SOLUTION) ×3 IMPLANT
YANKAUER SUCT BULB TIP NO VENT (SUCTIONS) IMPLANT

## 2018-08-19 NOTE — Discharge Instructions (Signed)

## 2018-08-19 NOTE — Op Note (Signed)
Karen Gardnereschecia Graham-Allen [id number] 08/19/2018   PRE-OP DIAGNOSIS: left sh imp and AC pain  POST-OP DIAGNOSIS: same  PROCEDURE: left sh scope  ANESTHESIA: general and block  Lakeitha Basques G   Dictation #:  [id number]

## 2018-08-19 NOTE — Anesthesia Procedure Notes (Signed)
Anesthesia Regional Block: Interscalene brachial plexus block   Pre-Anesthetic Checklist: ,, timeout performed, Correct Patient, Correct Site, Correct Laterality, Correct Procedure, Correct Position, site marked, Risks and benefits discussed,  Surgical consent,  Pre-op evaluation,  At surgeon's request and post-op pain management  Laterality: Left  Prep: chloraprep       Needles:  Injection technique: Single-shot  Needle Type: Stimulator Needle - 40     Needle Length: 4cm  Needle Gauge: 22     Additional Needles:   Procedures:,,,, ultrasound used (permanent image in chart),,,,  Narrative:  Start time: 08/19/2018 2:05 PM End time: 08/19/2018 2:10 PM Injection made incrementally with aspirations every 5 mL.  Performed by: Personally  Anesthesiologist: Lewie LoronGermeroth, Neils Siracusa, MD  Additional Notes: BP cuff, EKG monitors applied. Sedation begun. Nerve location verified with U/S. Anesthetic injected incrementally, slowly , and after neg aspirations under direct u/s guidance. Good perineural spread. Tolerated well.

## 2018-08-19 NOTE — Transfer of Care (Signed)
Immediate Anesthesia Transfer of Care Note  Patient: Karen Hicks  Procedure(s) Performed: SHOULDER ARTHROSCOPY WITH SUBACROMIAL DECOMPRESSION AND DISTAL CLAVICLE EXCISION (Left Shoulder)  Patient Location: PACU  Anesthesia Type:GA combined with regional for post-op pain  Level of Consciousness: awake, alert , oriented and drowsy  Airway & Oxygen Therapy: Patient Spontanous Breathing and Patient connected to face mask oxygen  Post-op Assessment: Report given to RN and Post -op Vital signs reviewed and stable  Post vital signs: Reviewed and stable  Last Vitals:  Vitals Value Taken Time  BP 130/92 08/19/2018  4:02 PM  Temp 36.9 C 08/19/2018  4:01 PM  Pulse 86 08/19/2018  4:14 PM  Resp 21 08/19/2018  4:14 PM  SpO2 99 % 08/19/2018  4:14 PM  Vitals shown include unvalidated device data.  Last Pain:  Vitals:   08/19/18 1601  TempSrc:   PainSc: 0-No pain         Complications: No apparent anesthesia complications

## 2018-08-19 NOTE — Anesthesia Procedure Notes (Signed)
Procedure Name: Intubation Date/Time: 08/19/2018 2:39 PM Performed by: Willa Frater, CRNA Pre-anesthesia Checklist: Patient identified, Emergency Drugs available, Suction available and Patient being monitored Patient Re-evaluated:Patient Re-evaluated prior to induction Oxygen Delivery Method: Circle system utilized Preoxygenation: Pre-oxygenation with 100% oxygen Induction Type: IV induction Ventilation: Mask ventilation without difficulty Laryngoscope Size: Mac and 3 Grade View: Grade I Tube type: Oral Number of attempts: 1 Airway Equipment and Method: Stylet and Oral airway Placement Confirmation: ETT inserted through vocal cords under direct vision,  positive ETCO2 and breath sounds checked- equal and bilateral Secured at: 22 cm Tube secured with: Tape Dental Injury: Teeth and Oropharynx as per pre-operative assessment

## 2018-08-19 NOTE — Interval H&P Note (Signed)
History and Physical Interval Note:  08/19/2018 2:04 PM  Karen Hicks  has presented today for surgery, with the diagnosis of LEFT SHOULDER IMPINGEMENT AND AC PAIN  The various methods of treatment have been discussed with the patient and family. After consideration of risks, benefits and other options for treatment, the patient has consented to  Procedure(s): ARTHROSCOPY SHOULDER (Left) as a surgical intervention .  The patient's history has been reviewed, patient examined, no change in status, stable for surgery.  I have reviewed the patient's chart and labs.  Questions were answered to the patient's satisfaction.     Aws Shere G

## 2018-08-19 NOTE — Op Note (Signed)
NAMStacy Hicks: GRAHAM-ALLEN, Flynn MEDICAL RECORD [id number] ACCOUNT [id number] DATE OF BIRTH:08-28-1971 FACILITY: [facility] LOCATION: [facility] PHYSICIAN:Nyomie Ehrlich Reece AgarG. Chatara Lucente, MD  OPERATIVE REPORT  DATE OF PROCEDURE:  08/19/2018  PREOPERATIVE DIAGNOSES: 1.  Left shoulder impingement. 2.  Left shoulder acromioclavicular pain.  POSTOPERATIVE DIAGNOSES: 1.  Left shoulder impingement. 2.  Left shoulder acromioclavicular pain.  PROCEDURES: 1.  Left shoulder arthroscopic acromioplasty. 2.  Left shoulder arthroscopic debridement. 3.  Left shoulder arthroscopic acromioclavicular resection.  ANESTHESIA:  General and block.  ATTENDING SURGEON:  Marcene CorningPeter Jannis Atkins, MD  ASSISTANT:  Elodia FlorenceAndrew Nida, PA-C  INDICATION FOR PROCEDURE:  The patient is a 47 year old woman with a long history of left shoulder pain.  This persisted despite various conservative measures.  She has pain which limits her ability to rest and use her arm.  There is no history of  trauma, and we decided to forego MRI scan.  She was offered arthroscopy to consist most likely of acromioplasty and AC resection with the possibility of a cuff repair.  Informed operative consent was obtained after discussion of possible complications  including reaction to anesthesia and infection.  SUMMARY OF FINDINGS AND PROCEDURE:  Under general anesthesia and a block, a left shoulder arthroscopy was performed.  Glenohumeral joint showed no degenerative change, and the biceps tendon looked normal.  The cuff had some partial-thickness tearing seen  best from below.  She had a very prominent subacromial morphology dressed with an acromioplasty and also had bone-on-bone contact addressed with a formal AC resection, taking about a centimeter of the distal clavicle.  She did have some bursitis and  partial-thickness tearing of the bursal aspect of the cuff, and a thorough debridement was done in these areas.  She was scheduled to go home the same day.  DESCRIPTION  OF PROCEDURE:  The patient was taken to the operating suite where general anesthetic was applied without difficulty.  She was also given a block in the pre-anesthesia area.  She was positioned in beach-chair position and prepped and draped in  normal sterile fashion.  After the administration of preoperative IV vancomycin and an appropriate time-out, and arthroscopy of the left shoulder was performed to a total of 3 portals.  Findings were as noted above, and procedure consisted predominantly  of the acromioplasty, which was done with the bur in the lateral position, followed by transfer of the bur to the posterior position.  We also performed a formal AC resection through the anterior portal.  I then performed an extensive debridement of  both aspects of the rotator cuff supraspinatus and infraspinatus as well as debriding the subacromial space extensively to expose the rotator cuff.  No tear where the repair was found.  The shoulder was irrigated, followed by removal of arthroscopic  equipment.  We used simple sutures and nylon to loosely reapproximate the portals, followed by Adaptic, dry gauze, and tape.  ESTIMATED BLOOD LOSS:  None.  FLUIDS:  Obtain from the anesthesia records.  DISPOSITION:  The patient was extubated in the operating room and taken to recovery room in stable condition.  She was to go home the same day and follow up in the office next week.  I will contact her by phone tonight.  LN/NUANCE  D:08/19/2018 T:08/19/2018 JOB:[id number]

## 2018-08-19 NOTE — Progress Notes (Signed)
Assisted Dr. Germeroth with left, ultrasound guided, supraclavicular block. Side rails up, monitors on throughout procedure. See vital signs in flow sheet. Tolerated Procedure well. 

## 2018-08-22 NOTE — Anesthesia Postprocedure Evaluation (Signed)
Anesthesia Post Note  Patient: Engineer, siteeschecia Hicks  Procedure(s) Performed: SHOULDER ARTHROSCOPY WITH SUBACROMIAL DECOMPRESSION AND DISTAL CLAVICLE EXCISION (Left Shoulder)     Patient location during evaluation: PACU Anesthesia Type: General Level of consciousness: sedated and patient cooperative Pain management: pain level controlled Vital Signs Assessment: post-procedure vital signs reviewed and stable Respiratory status: spontaneous breathing Cardiovascular status: stable Anesthetic complications: no    Last Vitals:  Vitals:   08/19/18 1630 08/19/18 1648  BP:  134/81  Pulse:  81  Resp: 18 18  Temp:  36.7 C  SpO2: 95% 95%    Last Pain:  Vitals:   08/19/18 1648  TempSrc:   PainSc: 0-No pain                 Lewie LoronJohn Briana Farner

## 2018-09-19 ENCOUNTER — Telehealth: Payer: Self-pay | Admitting: Family Medicine

## 2018-09-19 NOTE — Telephone Encounter (Signed)
[facility] req [facility] Nas[facility] Spray refill

## 2018-09-21 MED ORDER — ZOLMITRIPTAN 5 MG NA SOLN: 1.0000 | NASAL | 0 refills | Status: DC | PRN

## 2018-10-15 ENCOUNTER — Ambulatory Visit: Payer: BC Managed Care – PPO | Admitting: Family Medicine

## 2018-10-15 ENCOUNTER — Encounter: Payer: Self-pay | Admitting: Family Medicine

## 2018-10-15 VITALS — BP 122/70 | HR 66 | Temp 97.8°F | Wt 208.8 lb

## 2018-10-15 DIAGNOSIS — E119 Type 2 diabetes mellitus without complications: Secondary | ICD-10-CM | POA: Diagnosis not present

## 2018-10-15 DIAGNOSIS — E1169 Type 2 diabetes mellitus with other specified complication: Secondary | ICD-10-CM

## 2018-10-15 DIAGNOSIS — E785 Hyperlipidemia, unspecified: Secondary | ICD-10-CM

## 2018-10-15 DIAGNOSIS — E669 Obesity, unspecified: Secondary | ICD-10-CM

## 2018-10-15 LAB — POCT GLYCOSYLATED HEMOGLOBIN (HGB A1C): Hemoglobin A1C: 6.6 % — AB (ref 4.0–5.6)

## 2018-10-15 MED ORDER — LISINOPRIL 5 MG PO TABS: 5.0000 mg | ORAL_TABLET | Freq: Every day | ORAL | 3 refills | Status: DC

## 2018-10-15 NOTE — Progress Notes (Signed)
  Subjective:    Patient ID: Karen Hicks, female    DOB: 04-22-1971, 48 y.o.   MRN: [id number]  Karen Hicks is a 48 y.o. female who presents for follow-up of Type 2 diabetes mellitus.  Patient is checking home blood sugars.   Home blood sugar records: meter records How often is blood sugars being checked: QD fasting and 2 hours post  80-140 Current symptoms/problems include thirsty a lot. Daily foot checks: yes   Any foot concerns: none at this time Last eye exam: Dec 2019 Exercise: three times a week.  She is also getting rehab on her shoulder because of recent shoulder surgery. She has been through the diabetes and nutrition program and has made dietary changes.  She did have a hemoglobin A1c done in December which was 6.9.  Presently she is taking Crestor and having no difficulty with that.  The following portions of the patient's history were reviewed and updated as appropriate: allergies, current medications, past medical history, past social history and problem list.  ROS as in subjective above.     Objective:    Physical Exam Alert and in no distress otherwise not examined.  Hemoglobin A1c 6.6 Lab Review Diabetic Labs Latest Ref Rng & Units 10/15/2018 08/18/2018 06/12/2018 03/05/2018 10/22/2017  HbA1c 4.0 - 5.6 % 6.6(A) 6.9(H) 7.5(A) 7.6(A) 7.1(H)  Chol 100 - 199 mg/dL - - - - 169(C)  HDL >78 mg/dL - - - - 43  Calc LDL 0 - 99 mg/dL - - - - 938(B)  Triglycerides 0 - 149 mg/dL - - - - 017(P)  Creatinine 0.44 - 1.00 mg/dL - 1.02 - - 5.85   BP/Weight 10/15/2018 08/19/2018 08/18/2018 08/15/2018 06/12/2018  Systolic BP 122 134 126 130 124  Diastolic BP 70 81 75 88 76  Wt. (Lbs) 208.8 202.82 202.9 217.2 206.7  BMI 32.7 31.77 31.78 34.02 32.37     Karen Hicks  reports that she has never smoked. She has never used smokeless tobacco. She reports current alcohol use. She reports that she does not use drugs.     Assessment & Plan:    Type 2 diabetes mellitus  without complication, without long-term current use of insulin (HCC) - Plan: lisinopril (PRINIVIL,ZESTRIL) 5 MG tablet, POCT glycosylated hemoglobin (Hb A1C)  Hyperlipidemia associated with type 2 diabetes mellitus (HCC)  Obesity (BMI 30-39.9)   1. Rx changes: Lisinopril low-dose lisinopril for kidney preservation.  Also discussed possible side effects of the medication. 2. Education: Reviewed 'ABCs' of diabetes management (respective goals in parentheses):  A1C (<7), blood pressure (<130/80), and cholesterol (LDL <100). 3. Compliance at present is estimated to be good. Efforts to improve compliance (if necessary) will be directed at Continue with present diet and exercise.. 4. Follow up: 6 months She is doing quite nicely and feel that I can stretch her out every 6 months.

## 2018-10-29 ENCOUNTER — Other Ambulatory Visit: Payer: Self-pay | Admitting: Family Medicine

## 2018-10-29 NOTE — Telephone Encounter (Signed)
[facility] to [facility] Zom[facility]. Please advise [facility]

## 2019-02-19 DIAGNOSIS — M24131 Other articular cartilage disorders, right wrist: Secondary | ICD-10-CM | POA: Diagnosis not present

## 2019-02-19 DIAGNOSIS — M25531 Pain in right wrist: Secondary | ICD-10-CM | POA: Diagnosis not present

## 2019-03-04 DIAGNOSIS — Z20828 Contact with and (suspected) exposure to other viral communicable diseases: Secondary | ICD-10-CM | POA: Diagnosis not present

## 2019-03-09 DIAGNOSIS — M25531 Pain in right wrist: Secondary | ICD-10-CM | POA: Diagnosis not present

## 2019-03-17 DIAGNOSIS — M25531 Pain in right wrist: Secondary | ICD-10-CM | POA: Diagnosis not present

## 2019-03-23 DIAGNOSIS — M25531 Pain in right wrist: Secondary | ICD-10-CM | POA: Diagnosis not present

## 2019-04-15 ENCOUNTER — Ambulatory Visit: Payer: BC Managed Care – PPO | Admitting: Family Medicine

## 2019-05-20 ENCOUNTER — Other Ambulatory Visit: Payer: Self-pay

## 2019-05-20 DIAGNOSIS — R6889 Other general symptoms and signs: Secondary | ICD-10-CM | POA: Diagnosis not present

## 2019-05-20 DIAGNOSIS — Z20822 Contact with and (suspected) exposure to covid-19: Secondary | ICD-10-CM

## 2019-05-21 LAB — NOVEL CORONAVIRUS, NAA: SARS-CoV-2, NAA: NOT DETECTED

## 2019-07-06 ENCOUNTER — Ambulatory Visit (INDEPENDENT_AMBULATORY_CARE_PROVIDER_SITE_OTHER): Payer: BC Managed Care – PPO | Admitting: Family Medicine

## 2019-07-06 ENCOUNTER — Other Ambulatory Visit: Payer: Self-pay

## 2019-07-06 ENCOUNTER — Encounter: Payer: Self-pay | Admitting: Family Medicine

## 2019-07-06 VITALS — BP 138/82 | HR 68 | Temp 97.3°F | Wt 207.8 lb

## 2019-07-06 DIAGNOSIS — Z23 Encounter for immunization: Secondary | ICD-10-CM

## 2019-07-06 DIAGNOSIS — H01005 Unspecified blepharitis left lower eyelid: Secondary | ICD-10-CM | POA: Diagnosis not present

## 2019-07-06 MED ORDER — ERYTHROMYCIN 5 MG/GM OP OINT: 1.0000 "application " | TOPICAL_OINTMENT | Freq: Three times a day (TID) | OPHTHALMIC | 0 refills | Status: DC

## 2019-07-06 NOTE — Progress Notes (Signed)
   Subjective:    Patient ID: Karen Hicks, female    DOB: 1971/05/21, 48 y.o.   MRN: [id number]  HPI She complains of a several day history of redness to the conjunctivo as well as some slight swelling to the left lower eyelid.  No drainage from the eye, photophobia, sore throat, earache   Review of Systems     Objective:   Physical Exam Alert and in no distress.  Left lower eyelid is slightly edematous and the mucosal surface is erythematous.  Conjunctive is normal.  Cornea normal.       Assessment & Plan:  Blepharitis of left lower eyelid, unspecified type - Plan: erythromycin ophthalmic ointment  Need for influenza vaccination - Plan: Flu Vaccine QUAD 6+ mos PF IM (Fluarix Quad PF) She is to use the medication 3 times per day.  She will call if further trouble.

## 2019-08-12 LAB — HM DIABETES EYE EXAM

## 2019-09-30 ENCOUNTER — Ambulatory Visit (INDEPENDENT_AMBULATORY_CARE_PROVIDER_SITE_OTHER): Payer: BC Managed Care – PPO | Admitting: Family Medicine

## 2019-09-30 ENCOUNTER — Encounter: Payer: Self-pay | Admitting: Family Medicine

## 2019-09-30 ENCOUNTER — Other Ambulatory Visit: Payer: Self-pay

## 2019-09-30 VITALS — BP 148/94 | HR 69 | Temp 97.1°F | Wt 205.0 lb

## 2019-09-30 DIAGNOSIS — J301 Allergic rhinitis due to pollen: Secondary | ICD-10-CM | POA: Diagnosis not present

## 2019-09-30 DIAGNOSIS — E119 Type 2 diabetes mellitus without complications: Secondary | ICD-10-CM

## 2019-09-30 NOTE — Progress Notes (Signed)
   Subjective:    Patient ID: Karen Hicks, female    DOB: 1970/10/11, 49 y.o.   MRN: [id number]  HPI She is here for consult concerning nosebleeds and elevated blood pressure.  She presently is on low-dose lisinopril for treatment of her underlying diabetes.  She does have allergies and has been using Sudafed as well as Claritin-D.  In the past she had used Nasacort but is not using it at the present time.  She states that the nosebleed is only on the right-hand side.   Review of Systems     Objective:   Physical Exam Alert and in no distress.  Exam of the nasal mucosa does show erythema but no actual recent bleeding or prominent vessels.  Blood pressure is recorded.       Assessment & Plan:  Type 2 diabetes mellitus without complication, without long-term current use of insulin (HCC)  Allergic rhinitis due to pollen, unspecified seasonality She presently is on an ACE mainly because of the diabetes. I will have her stop all of her decongestant type medications and keep an eye on her blood pressure.  She can do this at work.  She is also to use Nasacort nasal spray as well as plain Claritin.  Return here for routine diabetes follow-up.

## 2019-10-06 ENCOUNTER — Ambulatory Visit (INDEPENDENT_AMBULATORY_CARE_PROVIDER_SITE_OTHER): Payer: BC Managed Care – PPO | Admitting: Family Medicine

## 2019-10-06 ENCOUNTER — Other Ambulatory Visit: Payer: Self-pay

## 2019-10-06 ENCOUNTER — Encounter: Payer: Self-pay | Admitting: Family Medicine

## 2019-10-06 VITALS — BP 136/92 | HR 71 | Temp 96.4°F | Wt 204.0 lb

## 2019-10-06 DIAGNOSIS — Z96651 Presence of right artificial knee joint: Secondary | ICD-10-CM | POA: Diagnosis not present

## 2019-10-06 DIAGNOSIS — Z96652 Presence of left artificial knee joint: Secondary | ICD-10-CM | POA: Diagnosis not present

## 2019-10-06 DIAGNOSIS — G4730 Sleep apnea, unspecified: Secondary | ICD-10-CM

## 2019-10-06 DIAGNOSIS — E669 Obesity, unspecified: Secondary | ICD-10-CM

## 2019-10-06 DIAGNOSIS — E1169 Type 2 diabetes mellitus with other specified complication: Secondary | ICD-10-CM | POA: Diagnosis not present

## 2019-10-06 DIAGNOSIS — E119 Type 2 diabetes mellitus without complications: Secondary | ICD-10-CM | POA: Diagnosis not present

## 2019-10-06 DIAGNOSIS — G43009 Migraine without aura, not intractable, without status migrainosus: Secondary | ICD-10-CM

## 2019-10-06 DIAGNOSIS — Z79899 Other long term (current) drug therapy: Secondary | ICD-10-CM

## 2019-10-06 DIAGNOSIS — E785 Hyperlipidemia, unspecified: Secondary | ICD-10-CM | POA: Diagnosis not present

## 2019-10-06 LAB — HM MAMMOGRAPHY

## 2019-10-06 LAB — POCT GLYCOSYLATED HEMOGLOBIN (HGB A1C): Hemoglobin A1C: 9.5 % — AB (ref 4.0–5.6)

## 2019-10-06 LAB — POCT UA - MICROALBUMIN
Albumin/Creatinine Ratio, Urine, POC: 6.4
Creatinine, POC: 293.2 mg/dL
Microalbumin Ur, POC: 18.8 mg/L

## 2019-10-06 MED ORDER — SYNJARDY XR 10-1000 MG PO TB24: 1.0000 | ORAL_TABLET | Freq: Every day | ORAL | 5 refills | Status: DC

## 2019-10-06 NOTE — Progress Notes (Signed)
  Subjective:    Patient ID: Karen Hicks, female    DOB: 1971/08/28, 49 y.o.   MRN: [id number]  Karen Hicks is a 49 y.o. female who presents for follow-up of Type 2 diabetes mellitus. Home blood sugar records: meter records 215-240 she states that she usually checks her sugars twice per week and has noted increases as mentioned above approximately 3 weeks ago. Current symptoms/problems include elevated blood sugar readings the last two weeks. Daily foot checks: yes   Any foot concerns:none at this time Exercise: 30 min. walking Diet: not bad   She does have underlying sleep apnea but apparently does not need CPAP.  She continues on Crestor and having no difficulty with that.  I have her also on low-dose lisinopril.  She does have a history of migraine headaches but these occur rarely.  She has had both knees replaced and is doing well.  The following portions of the patient's history were reviewed and updated as appropriate: allergies, current medications, past medical history, past social history and problem list.  ROS as in subjective above.     Objective:    Physical Exam Alert and in no distress foot exam is normal.  Hemoglobin A1c is 9.5.    Lab Review Diabetic Labs Latest Ref Rng & Units 10/15/2018 08/18/2018 06/12/2018 03/05/2018 10/22/2017  HbA1c 4.0 - 5.6 % 6.6(A) 6.9(H) 7.5(A) 7.6(A) 7.1(H)  Chol 100 - 199 mg/dL - - - - 751(W)  HDL >25 mg/dL - - - - 43  Calc LDL 0 - 99 mg/dL - - - - 852(D)  Triglycerides 0 - 149 mg/dL - - - - 782(U)  Creatinine 0.44 - 1.00 mg/dL - 2.35 - - 3.61   BP/Weight 09/30/2019 07/06/2019 10/15/2018 08/19/2018 08/18/2018  Systolic BP 148 138 122 134 126  Diastolic BP 94 82 70 81 75  Wt. (Lbs) 205 207.8 208.8 202.82 202.9  BMI 32.11 32.55 32.7 31.77 31.78     Aariana  reports that she has never smoked. She has never used smokeless tobacco. She reports current alcohol use. She reports that she does not use drugs.     Assessment  & Plan:    Type 2 diabetes mellitus without complication, without long-term current use of insulin (HCC) - Plan: Empagliflozin-metFORMIN HCl ER (SYNJARDY XR) 06-999 MG TB24, CBC with Differential/Platelet, Comprehensive metabolic panel, Lipid panel, POCT UA - Microalbumin  Sleep apnea, unspecified type  Obesity (BMI 30-39.9) - Plan: CBC with Differential/Platelet, Comprehensive metabolic panel, Lipid panel  S/P TKR (total knee replacement), right  Status post total left knee replacement  Migraine without aura and without status migrainosus, not intractable  Hyperlipidemia associated with type 2 diabetes mellitus (HCC) - Plan: Lipid panel  Encounter for long-term (current) use of medications - Plan: Empagliflozin-metFORMIN HCl ER (SYNJARDY XR) 06-999 MG TB24, CBC with Differential/Platelet, Comprehensive metabolic panel, Lipid panel, POCT UA - Microalbumin   1. Rx changes: Place her on Synjardy I discussed possible side effects of this medication in regard to urinary problems.  Also discussed the various benefits from the medication. 2. Education: Reviewed 'ABCs' of diabetes management (respective goals in parentheses):  A1C (<7), blood pressure (<130/80), and cholesterol (LDL <100). 3. Compliance at present is estimated to be good. Efforts to improve compliance (if necessary) will be directed at Continue with present diet and exercise regimen.. 4. Follow up: 4 months If she has trouble getting this covered, she will call back.

## 2019-10-07 LAB — CBC WITH DIFFERENTIAL/PLATELET
Basophils Absolute: 0.1 10*3/uL (ref 0.0–0.2)
Basos: 1 %
EOS (ABSOLUTE): 0.2 10*3/uL (ref 0.0–0.4)
Eos: 2 %
Hematocrit: 44.1 % (ref 34.0–46.6)
Hemoglobin: 14 g/dL (ref 11.1–15.9)
Immature Grans (Abs): 0 10*3/uL (ref 0.0–0.1)
Immature Granulocytes: 0 %
Lymphocytes Absolute: 3.3 10*3/uL — ABNORMAL HIGH (ref 0.7–3.1)
Lymphs: 50 %
MCH: 26.5 pg — ABNORMAL LOW (ref 26.6–33.0)
MCHC: 31.7 g/dL (ref 31.5–35.7)
MCV: 84 fL (ref 79–97)
Monocytes Absolute: 0.4 10*3/uL (ref 0.1–0.9)
Monocytes: 5 %
Neutrophils Absolute: 2.9 10*3/uL (ref 1.4–7.0)
Neutrophils: 42 %
Platelets: 245 10*3/uL (ref 150–450)
RBC: 5.28 x10E6/uL (ref 3.77–5.28)
RDW: 13 % (ref 11.7–15.4)
WBC: 6.8 10*3/uL (ref 3.4–10.8)

## 2019-10-07 LAB — COMPREHENSIVE METABOLIC PANEL
ALT: 83 IU/L — ABNORMAL HIGH (ref 0–32)
AST: 90 IU/L — ABNORMAL HIGH (ref 0–40)
Albumin/Globulin Ratio: 1.7 (ref 1.2–2.2)
Albumin: 4.7 g/dL (ref 3.8–4.8)
Alkaline Phosphatase: 132 IU/L — ABNORMAL HIGH (ref 39–117)
BUN/Creatinine Ratio: 12 (ref 9–23)
BUN: 9 mg/dL (ref 6–24)
Bilirubin Total: 0.4 mg/dL (ref 0.0–1.2)
CO2: 23 mmol/L (ref 20–29)
Calcium: 9.8 mg/dL (ref 8.7–10.2)
Chloride: 100 mmol/L (ref 96–106)
Creatinine, Ser: 0.76 mg/dL (ref 0.57–1.00)
GFR calc Af Amer: 107 mL/min/{1.73_m2} (ref >59)
GFR calc non Af Amer: 93 mL/min/{1.73_m2} (ref >59)
Globulin, Total: 2.8 g/dL (ref 1.5–4.5)
Glucose: 235 mg/dL — ABNORMAL HIGH (ref 65–99)
Potassium: 4.8 mmol/L (ref 3.5–5.2)
Sodium: 139 mmol/L (ref 134–144)
Total Protein: 7.5 g/dL (ref 6.0–8.5)

## 2019-10-07 LAB — LIPID PANEL
Chol/HDL Ratio: 5.8 ratio — ABNORMAL HIGH (ref 0.0–4.4)
Cholesterol, Total: 222 mg/dL — ABNORMAL HIGH (ref 100–199)
HDL: 38 mg/dL — ABNORMAL LOW (ref >39)
LDL Chol Calc (NIH): 135 mg/dL — ABNORMAL HIGH (ref 0–99)
Triglycerides: 275 mg/dL — ABNORMAL HIGH (ref 0–149)
VLDL Cholesterol Cal: 49 mg/dL — ABNORMAL HIGH (ref 5–40)

## 2019-10-08 ENCOUNTER — Encounter: Payer: Self-pay | Admitting: *Deleted

## 2019-10-09 ENCOUNTER — Telehealth: Payer: Self-pay

## 2019-10-09 NOTE — Telephone Encounter (Signed)
Called pt to advise of U/S being scheduled for 10-14-19 at 9:25am. Pt will need to be fasting and off of med in the am if she can and med is not time sensitive . Pt will be having U/S at the [address] location. KH

## 2019-10-09 NOTE — Addendum Note (Signed)
Addended by: Ronnald Nian on: 10/09/2019 07:44 AM   Modules accepted: Orders

## 2019-10-10 LAB — SPECIMEN STATUS REPORT

## 2019-10-10 LAB — HEPATITIS PANEL, ACUTE
Hep A IgM: NEGATIVE
Hep B C IgM: NEGATIVE
Hep C Virus Ab: <0.1 s/co ratio (ref 0.0–0.9)
Hepatitis B Surface Ag: NEGATIVE

## 2019-10-14 ENCOUNTER — Ambulatory Visit
Admission: RE | Admit: 2019-10-14 | Discharge: 2019-10-14 | Disposition: A | Discharge status: Home / Self Care | Payer: BC Managed Care – PPO | Source: Ambulatory Visit | Attending: Family Medicine | Admitting: Family Medicine

## 2019-10-14 ENCOUNTER — Telehealth: Payer: Self-pay | Admitting: Family Medicine

## 2019-10-14 ENCOUNTER — Other Ambulatory Visit: Payer: Self-pay

## 2019-10-14 ENCOUNTER — Telehealth: Payer: Self-pay

## 2019-10-14 NOTE — Telephone Encounter (Signed)
Called pt to find out more info. LVM KH

## 2019-10-14 NOTE — Telephone Encounter (Signed)
Pt was advised and will call back to let us know how she is doing off the med. KH

## 2019-10-14 NOTE — Telephone Encounter (Signed)
Pt called and states that she is having reactions to new medication. She was placed on Synjardy XR. She states she is experiencing being lightheaded, calf muscle cramps and chest pain. Please advise pt at [phone]. Sending to [place] and walking back to [employer].

## 2019-10-14 NOTE — Telephone Encounter (Signed)
Have her stop the medicine for a few days and see if the symptoms will go away

## 2019-10-31 ENCOUNTER — Encounter: Payer: Self-pay | Admitting: Family Medicine

## 2019-11-03 ENCOUNTER — Other Ambulatory Visit: Payer: Self-pay

## 2019-11-03 DIAGNOSIS — R1011 Right upper quadrant pain: Secondary | ICD-10-CM

## 2019-11-10 ENCOUNTER — Encounter (HOSPITAL_COMMUNITY): Payer: BC Managed Care – PPO

## 2019-11-16 ENCOUNTER — Encounter (HOSPITAL_COMMUNITY)
Admission: RE | Admit: 2019-11-16 | Discharge: 2019-11-16 | Disposition: A | Discharge status: Home / Self Care | Payer: BC Managed Care – PPO | Source: Ambulatory Visit | Attending: Family Medicine | Admitting: Family Medicine

## 2019-11-16 ENCOUNTER — Other Ambulatory Visit: Payer: Self-pay

## 2019-11-16 DIAGNOSIS — R1011 Right upper quadrant pain: Secondary | ICD-10-CM | POA: Insufficient documentation

## 2019-11-16 MED ORDER — TECHNETIUM TC 99M MEBROFENIN IV KIT
5.5000 | PACK | Freq: Once | INTRAVENOUS | Status: AC | PRN
  Administered 2019-11-16: 5.5 via INTRAVENOUS

## 2019-11-17 ENCOUNTER — Telehealth: Payer: Self-pay | Admitting: Family Medicine

## 2019-11-17 NOTE — Telephone Encounter (Signed)
Requested records received from [facility]  °

## 2019-11-18 ENCOUNTER — Other Ambulatory Visit: Payer: Self-pay

## 2019-11-18 DIAGNOSIS — R1011 Right upper quadrant pain: Secondary | ICD-10-CM

## 2019-11-24 ENCOUNTER — Encounter: Payer: Self-pay | Admitting: Family Medicine

## 2019-12-28 ENCOUNTER — Other Ambulatory Visit: Payer: Self-pay | Admitting: General Surgery

## 2019-12-28 DIAGNOSIS — K219 Gastro-esophageal reflux disease without esophagitis: Secondary | ICD-10-CM

## 2020-01-12 ENCOUNTER — Ambulatory Visit
Admission: RE | Admit: 2020-01-12 | Discharge: 2020-01-12 | Disposition: A | Discharge status: Home / Self Care | Payer: BC Managed Care – PPO | Source: Ambulatory Visit | Attending: General Surgery | Admitting: General Surgery

## 2020-01-12 ENCOUNTER — Other Ambulatory Visit: Payer: Self-pay

## 2020-01-12 DIAGNOSIS — K219 Gastro-esophageal reflux disease without esophagitis: Secondary | ICD-10-CM

## 2020-01-12 DIAGNOSIS — R079 Chest pain, unspecified: Secondary | ICD-10-CM | POA: Diagnosis not present

## 2020-01-12 DIAGNOSIS — R1011 Right upper quadrant pain: Secondary | ICD-10-CM | POA: Diagnosis not present

## 2020-02-17 ENCOUNTER — Encounter: Payer: Self-pay | Admitting: Family Medicine

## 2020-02-18 HISTORY — PX: UPPER GI ENDOSCOPY: SHX6162

## 2020-02-18 LAB — HM COLONOSCOPY

## 2020-02-23 ENCOUNTER — Encounter: Payer: Self-pay | Admitting: Family Medicine

## 2020-02-23 ENCOUNTER — Other Ambulatory Visit: Payer: Self-pay

## 2020-02-23 ENCOUNTER — Ambulatory Visit (INDEPENDENT_AMBULATORY_CARE_PROVIDER_SITE_OTHER): Payer: BC Managed Care – PPO | Admitting: Family Medicine

## 2020-02-23 VITALS — BP 114/76 | HR 77 | Temp 96.8°F | Wt 196.4 lb

## 2020-02-23 DIAGNOSIS — E119 Type 2 diabetes mellitus without complications: Secondary | ICD-10-CM | POA: Diagnosis not present

## 2020-02-23 DIAGNOSIS — E669 Obesity, unspecified: Secondary | ICD-10-CM

## 2020-02-23 LAB — POCT GLYCOSYLATED HEMOGLOBIN (HGB A1C): Hemoglobin A1C: 10.4 % — AB (ref 4.0–5.6)

## 2020-02-23 NOTE — Progress Notes (Signed)
  Subjective:    Patient ID: Karen Hicks, female    DOB: [birth year], 49 y.o.   MRN: [id number]  Karen Hicks is a 49 y.o. female who presents for follow-up of Type 2 diabetes mellitus.  Home blood sugar records: meter record , 210-247 fasting and post meal Current symptoms/problems include cramps in feet and elevated blood sugar.  Daily foot checks:Yes   Any foot concerns: cramps Exercise: joined the gym Diet: Good She states that when she started on the [place] she noted increased heart rate, shortness of breath, dizziness and stopped it after several days.  I reviewed the record and do not see that she reported this to me.  During the same time she was having right upper quadrant pain and has been seen by surgery.  Her HIDA scan was 32%.  CT scan of the abdomen was also done and the surgeon decided to not pursue this.  Presently she seems to be doing fairly well from this.  She was referred to Dr. Elnoria Howard because of difficulty with swallowing and apparently esophageal stricture was seen and he did stretch this.  He did recommend diet and exercise. The following portions of the patient's history were reviewed and updated as appropriate: allergies, current medications, past medical history, past social history and problem list.  ROS as in subjective above.     Objective:    Physical Exam Alert and in no distress otherwise not examined. Hemoglobin A1c is 10.4   Lab Review Diabetic Labs Latest Ref Rng & Units 10/06/2019 10/15/2018 08/18/2018 06/12/2018 03/05/2018  HbA1c 4.0 - 5.6 % 9.5(A) 6.6(A) 6.9(H) 7.5(A) 7.6(A)  Microalbumin mg/L 18.8 - - - -  Micro/Creat Ratio - 6.4 - - - -  Chol 100 - 199 mg/dL 010(X) - - - -  HDL >32 mg/dL 35(T) - - - -  Calc LDL 0 - 99 mg/dL 732(K) - - - -  Triglycerides 0 - 149 mg/dL 025(K) - - - -  Creatinine 0.57 - 1.00 mg/dL 2.70 - 6.23 - -   BP/Weight 10/06/2019 09/30/2019 07/06/2019 10/15/2018 08/19/2018  Systolic BP 136 148 138 122 134    Diastolic BP 92 94 82 70 81  Wt. (Lbs) 204 205 207.8 208.8 202.82  BMI 31.95 32.11 32.55 32.7 31.77   Foot/eye exam completion dates Latest Ref Rng & Units 10/06/2019 08/12/2019  Eye Exam No Retinopathy - No Retinopathy  Foot Form Completion - Done -    Roxie  reports that she has never smoked. She has never used smokeless tobacco. She reports current alcohol use. She reports that she does not use drugs.     Assessment & Plan:     Type 2 diabetes mellitus without complication, without long-term current use of insulin (HCC)  Obesity (BMI 30-39.9)   1. Rx changes: I will go slow with her and give her Farxiga samples and if she does well with that then I will add Metformin in the form of Xigduo she will also eventually need further intervention with statins as well as kidney protection.  Explained the fact that the [place] can cause yeast infections.  She will keep me informed. 2. Education: Reviewed 'ABCs' of diabetes management (respective goals in parentheses):  A1C (<7), blood pressure (<130/80), and cholesterol (LDL <100). 3. Compliance at present is estimated to be poor. Efforts to improve compliance (if necessary) will be directed at increased exercise. 4. Follow up: 3 months

## 2020-02-23 NOTE — Patient Instructions (Signed)
Start with the [place] daily and then if doing okay then switch to the [place] and let me know

## 2020-02-26 ENCOUNTER — Encounter: Payer: Self-pay | Admitting: Family Medicine

## 2020-03-02 ENCOUNTER — Encounter: Payer: Self-pay | Admitting: Family Medicine

## 2020-03-08 ENCOUNTER — Encounter: Payer: Self-pay | Admitting: Family Medicine

## 2020-03-08 MED ORDER — METFORMIN HCL 1000 MG PO TABS
1000.0000 mg | ORAL_TABLET | Freq: Every day | ORAL | 1 refills | Status: DC
Start: 2020-03-08 — End: 2020-03-31

## 2020-03-08 MED ORDER — DAPAGLIFLOZIN PROPANEDIOL 5 MG PO TABS
5.0000 mg | ORAL_TABLET | Freq: Every day | ORAL | 1 refills | Status: DC
Start: 2020-03-08 — End: 2020-05-30

## 2020-03-23 ENCOUNTER — Encounter (HOSPITAL_COMMUNITY): Payer: Self-pay | Admitting: *Deleted

## 2020-03-23 ENCOUNTER — Ambulatory Visit (INDEPENDENT_AMBULATORY_CARE_PROVIDER_SITE_OTHER)
Admission: EM | Admit: 2020-03-23 | Discharge: 2020-03-23 | Disposition: A | Discharge status: Home / Self Care | Payer: BC Managed Care – PPO | Source: Home / Self Care

## 2020-03-23 ENCOUNTER — Emergency Department (HOSPITAL_COMMUNITY)
Admission: EM | Admit: 2020-03-23 | Discharge: 2020-03-24 | Disposition: A | Discharge status: Home / Self Care | Payer: BC Managed Care – PPO | Attending: Emergency Medicine | Admitting: Emergency Medicine

## 2020-03-23 ENCOUNTER — Other Ambulatory Visit: Payer: Self-pay

## 2020-03-23 ENCOUNTER — Encounter (HOSPITAL_COMMUNITY): Payer: Self-pay

## 2020-03-23 ENCOUNTER — Emergency Department (HOSPITAL_COMMUNITY): Payer: BC Managed Care – PPO

## 2020-03-23 DIAGNOSIS — Z91040 Latex allergy status: Secondary | ICD-10-CM | POA: Diagnosis not present

## 2020-03-23 DIAGNOSIS — Z96652 Presence of left artificial knee joint: Secondary | ICD-10-CM | POA: Insufficient documentation

## 2020-03-23 DIAGNOSIS — R0789 Other chest pain: Secondary | ICD-10-CM | POA: Diagnosis not present

## 2020-03-23 DIAGNOSIS — Z7984 Long term (current) use of oral hypoglycemic drugs: Secondary | ICD-10-CM | POA: Diagnosis not present

## 2020-03-23 DIAGNOSIS — R61 Generalized hyperhidrosis: Secondary | ICD-10-CM | POA: Insufficient documentation

## 2020-03-23 DIAGNOSIS — Z96651 Presence of right artificial knee joint: Secondary | ICD-10-CM | POA: Insufficient documentation

## 2020-03-23 DIAGNOSIS — I200 Unstable angina: Secondary | ICD-10-CM

## 2020-03-23 DIAGNOSIS — Z86711 Personal history of pulmonary embolism: Secondary | ICD-10-CM

## 2020-03-23 DIAGNOSIS — R079 Chest pain, unspecified: Secondary | ICD-10-CM | POA: Diagnosis not present

## 2020-03-23 DIAGNOSIS — E119 Type 2 diabetes mellitus without complications: Secondary | ICD-10-CM | POA: Insufficient documentation

## 2020-03-23 DIAGNOSIS — Z79899 Other long term (current) drug therapy: Secondary | ICD-10-CM | POA: Diagnosis not present

## 2020-03-23 DIAGNOSIS — E1165 Type 2 diabetes mellitus with hyperglycemia: Secondary | ICD-10-CM

## 2020-03-23 DIAGNOSIS — R0602 Shortness of breath: Secondary | ICD-10-CM | POA: Diagnosis not present

## 2020-03-23 LAB — CBC
HCT: 44.6 % (ref 36.0–46.0)
Hemoglobin: 13.8 g/dL (ref 12.0–15.0)
MCH: 27.4 pg (ref 26.0–34.0)
MCHC: 30.9 g/dL (ref 30.0–36.0)
MCV: 88.7 fL (ref 80.0–100.0)
Platelets: 251 10*3/uL (ref 150–400)
RBC: 5.03 MIL/uL (ref 3.87–5.11)
RDW: 13.5 % (ref 11.5–15.5)
WBC: 8.1 10*3/uL (ref 4.0–10.5)
nRBC: 0 % (ref 0.0–0.2)

## 2020-03-23 LAB — I-STAT BETA HCG BLOOD, ED (MC, WL, AP ONLY): I-stat hCG, quantitative: <5 m[IU]/mL (ref <5)

## 2020-03-23 LAB — BASIC METABOLIC PANEL
Anion gap: 9 (ref 5–15)
BUN: 10 mg/dL (ref 6–20)
CO2: 27 mmol/L (ref 22–32)
Calcium: 9.8 mg/dL (ref 8.9–10.3)
Chloride: 101 mmol/L (ref 98–111)
Creatinine, Ser: 0.75 mg/dL (ref 0.44–1.00)
GFR calc Af Amer: >60 mL/min (ref >60)
GFR calc non Af Amer: >60 mL/min (ref >60)
Glucose, Bld: 136 mg/dL — ABNORMAL HIGH (ref 70–99)
Potassium: 4.7 mmol/L (ref 3.5–5.1)
Sodium: 137 mmol/L (ref 135–145)

## 2020-03-23 LAB — TROPONIN I (HIGH SENSITIVITY)
Troponin I (High Sensitivity): 4 ng/L (ref <18)
Troponin I (High Sensitivity): 4 ng/L (ref <18)

## 2020-03-23 MED ORDER — ASPIRIN 81 MG PO CHEW
324.0000 mg | CHEWABLE_TABLET | Freq: Once | ORAL | Status: AC
  Administered 2020-03-23: 324 mg via ORAL

## 2020-03-23 MED ORDER — ASPIRIN 81 MG PO CHEW
CHEWABLE_TABLET | ORAL | Status: AC
  Filled 2020-03-23: qty 4

## 2020-03-23 MED ORDER — SODIUM CHLORIDE 0.9% FLUSH: 3.0000 mL | Freq: Once | INTRAVENOUS | Status: DC

## 2020-03-23 NOTE — ED Notes (Signed)
Patient is being discharged from the Urgent Care and sent to the Emergency Department via wheelchair . Per Karen Hicks, patient is in need of higher level of care due to chest pain. Patient is aware and verbalizes understanding of plan of care.  Vitals:   03/23/20 1442  BP: 138/82  Pulse: 81  Resp: 16  Temp: 98.2 F (36.8 C)  SpO2: 99%

## 2020-03-23 NOTE — ED Triage Notes (Signed)
Rt sided chest pain  Since this am hx of pe  She was transferred here from the [facility] for treatment lmp hysterectomy  She was given 324mg  of aspirin at  The ucc

## 2020-03-23 NOTE — Discharge Instructions (Addendum)
You are in need of a higher level of care than we can provide in the urgent care setting. You have unstable angina and despite not having ekg changes need to have troponin levels drawn and repeat ekg to rule out a heart event. Having chest pain like yours can also be a pulmonary embolism as you have a history of this and a chest CT scan can help rule this out.

## 2020-03-23 NOTE — ED Provider Notes (Signed)
[facility]   MRN: [id number] DOB: 02/26/1971  Subjective:   Karen Hicks is a 49 y.o. female presenting for acute onset of upper chest pain across right to left side with shortness of breath and nausea at 10 AM.  Patient took a nitroglycerin at 230 without any relief from her symptoms.  Has past medical history of pulmonary embolism related to surgery.  Has severely uncontrolled diabetes, last A1c was greater than 10% June 2021.  Recently started metformin and Farxiga in the past 3-4 weeks. Had endoscopy ~3 weeks ago, had dilation of esophagus but was otherwise normal.    No current facility-administered medications for this encounter.  Current Outpatient Medications:  .  acetaminophen (TYLENOL 8 HOUR) 650 MG CR tablet, Tylenol Arthritis Pain 650 mg tablet,extended release (Patient not taking: Reported on 02/23/2020), Disp: , Rfl:  .  Acetaminophen (TYLENOL ARTHRITIS PAIN PO), Tylenol Arthritis Pain 650 mg tablet,extended release (Patient not taking: Reported on 02/23/2020), Disp: , Rfl:  .  azithromycin (ZITHROMAX) 500 MG tablet, Take 1 tablet (500 mg total) by mouth daily. (Patient not taking: Reported on 10/15/2018), Disp: 3 tablet, Rfl: 0 .  Colloidal Oatmeal (AVEENO ECZEMA THERAPY EX), Apply 1 application topically 2 (two) times daily., Disp: , Rfl:  .  dapagliflozin propanediol (FARXIGA) 5 MG TABS tablet, Take 1 tablet (5 mg total) by mouth daily before breakfast., Disp: 30 tablet, Rfl: 1 .  Dextromethorphan-guaiFENesin (MUCINEX DM MAXIMUM STRENGTH) 60-1200 MG TB12, Take 1 tablet by mouth 2 (two) times daily as needed (for congestion)., Disp: , Rfl:  .  erythromycin ophthalmic ointment, Place 1 application into the left eye 3 (three) times daily. (Patient not taking: Reported on 09/30/2019), Disp: 3.5 g, Rfl: 0 .  esomeprazole (NEXIUM) 20 MG capsule, Take 1 capsule (20 mg total) by mouth daily before breakfast. (Patient taking differently: Take 20 mg by mouth 2 (two) times  daily as needed (for heartburn). ), Disp: 30 capsule, Rfl: 11 .  glucose blood test strip, Use as instructed, Disp: 100 each, Rfl: 12 .  HYDROcodone-acetaminophen (NORCO) 5-325 MG tablet, Take 1-2 tablets by mouth every 6 (six) hours as needed for moderate pain. (Patient not taking: Reported on 07/06/2019), Disp: 20 tablet, Rfl: 0 .  lisinopril (PRINIVIL,ZESTRIL) 5 MG tablet, Take 1 tablet (5 mg total) by mouth daily., Disp: 90 tablet, Rfl: 3 .  metFORMIN (GLUCOPHAGE) 1000 MG tablet, Take 1 tablet (1,000 mg total) by mouth daily with breakfast., Disp: 30 tablet, Rfl: 1 .  methylPREDNISolone (MEDROL DOSEPAK) 4 MG TBPK tablet, , Disp: , Rfl:  .  phenol (CHLORASEPTIC) 1.4 % LIQD, Use as directed 1-2 sprays in the mouth or throat as needed for throat irritation / pain., Disp: , Rfl:  .  promethazine (PHENERGAN) 12.5 MG tablet, Take 1-2 tablets (12.5-25 mg total) by mouth every 6 (six) hours as needed for nausea or vomiting., Disp: 30 tablet, Rfl: 0 .  pseudoephedrine (SUDAFED) 30 MG tablet, Take 30 mg by mouth every 4 (four) hours as needed for congestion. (Patient not taking: Reported on 02/23/2020), Disp: , Rfl:  .  rosuvastatin (CRESTOR) 40 MG tablet, Take 1 tablet (40 mg total) by mouth daily., Disp: 90 tablet, Rfl: 3 .  Turmeric 1053 MG TABS, turmeric, Disp: , Rfl:  .  venlafaxine (EFFEXOR) 37.5 MG tablet, , Disp: , Rfl:  .  zolmitriptan (ZOMIG) 5 MG nasal solution, PLACE 1 SPRAY INTO THE NOSE AS NEEDED FOR MIGRAINE., Disp: 6 each, Rfl: 0   Allergies  Allergen  Reactions  . Mushroom Extract Complex Anaphylaxis  . Peach Flavor Anaphylaxis  . Adhesive [Tape] Hives    Paper tape only  . Penicillins Hives, Rash and Other (See Comments)    Tingling and shaking PATIENT HAS HAD A PCN REACTION WITH IMMEDIATE RASH, FACIAL/TONGUE/THROAT SWELLING, SOB, OR LIGHTHEADEDNESS WITH HYPOTENSION:  #  #  YES  #  #  HAS PT DEVELOPED SEVERE RASH INVOLVING MUCUS MEMBRANES or SKIN NECROSIS: #  #  YES  #  #  Has  patient had a PCN reaction that required hospitalization No Has patient had a PCN reaction occurring within the last 10 years: #  #  #  YES  #  #  #    . Latex Itching    Past Medical History:  Diagnosis Date  . Allergy   . Anemia    when she was young  . Arthritis   . Complication of anesthesia    takes her alittle longer to wake up  . GERD (gastroesophageal reflux disease)   . Irritable bowel   . Lactose intolerance   . Migraine   . PE (pulmonary embolism)    in 1994 meniscus repair.  Saw Dr. Susann Givens  . Pneumonia   . PONV (postoperative nausea and vomiting)   . Pre-diabetes   . Restless legs syndrome   . Sleep apnea    2007.  Uses the mask & machine when she needs it.  Dr. Susann Givens is aware     Past Surgical History:  Procedure Laterality Date  . ABDOMINAL HYSTERECTOMY  01/2006   FIBROIDS  . CESAREAN SECTION    . HARDWARE REMOVAL Left 08/03/2014   Procedure: HARDWARE REMOVAL;  Surgeon: Velna Ochs, MD;  Location: [facility] OR;  Service: Orthopedics;  Laterality: Left;  . HARDWARE REMOVAL Right 07/31/2016   Procedure: HARDWARE REMOVAL;  Surgeon: Marcene Corning, MD;  Location: [facility];  Service: Orthopedics;  Laterality: Right;  . KNEE SURGERY Bilateral    multiple  . 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 right wrist arthroscopy  2009  . TOTAL KNEE ARTHROPLASTY Left 08/03/2014   dr Jerl Santos  . TOTAL KNEE ARTHROPLASTY Left 08/03/2014   Procedure: LEFT TOTAL KNEE ARTHROPLASTY;  Surgeon: Velna Ochs, MD;  Location: [facility];  Service: Orthopedics;  Laterality: Left;  . TOTAL KNEE ARTHROPLASTY Right 07/31/2016   Procedure: TOTAL KNEE ARTHROPLASTY;  Surgeon: Marcene Corning, MD;  Location: [facility];  Service: Orthopedics;  Laterality: Right;    Family History  Problem Relation Age of Onset  . Arthritis Mother   . Diabetes Mother   . Heart disease Mother   . Hypertension Mother   . Kidney  disease Mother   . Stroke Mother   . Gallbladder disease Mother   . Glaucoma Mother   . Seizures Mother   . Migraines Mother   . Arthritis Father   . Diabetes Father   . Hypertension Father   . Kidney disease Father   . Gallbladder disease Father   . Migraines Sister   . Migraines Brother   . Healthy Son     Social History   Tobacco Use  . Smoking status: Never Smoker  . Smokeless tobacco: Never Used  Substance Use Topics  . Alcohol use: Yes    Comment: "with a good steak"  . Drug use: No    ROS   Objective:   Vitals: BP 138/82 (  BP Location: Right Arm)   Pulse 81   Temp 98.2 F (36.8 C) (Oral)   Resp 16   SpO2 99%   Physical Exam Constitutional:      General: She is not in acute distress.    Appearance: Normal appearance. She is well-developed and normal weight. She is not ill-appearing, toxic-appearing or diaphoretic.  HENT:     Head: Normocephalic and atraumatic.     Right Ear: External ear normal.     Left Ear: External ear normal.     Nose: Nose normal.     Mouth/Throat:     Mouth: Mucous membranes are moist.     Pharynx: Oropharynx is clear.  Eyes:     General: No scleral icterus.    Extraocular Movements: Extraocular movements intact.     Pupils: Pupils are equal, round, and reactive to light.  Cardiovascular:     Rate and Rhythm: Normal rate and regular rhythm.     Pulses: Normal pulses.     Heart sounds: Normal heart sounds. No murmur heard.  No friction rub. No gallop.   Pulmonary:     Effort: Pulmonary effort is normal. No respiratory distress.     Breath sounds: Normal breath sounds. No stridor. No wheezing, rhonchi or rales.  Chest:     Chest wall: Tenderness (overlying area outlined) present.    Abdominal:     General: Bowel sounds are normal. There is no distension.     Palpations: Abdomen is soft. There is no mass.     Tenderness: There is no abdominal tenderness. There is no right CVA tenderness, left CVA tenderness, guarding or  rebound.  Skin:    General: Skin is warm and dry.     Coloration: Skin is not pale.     Findings: No rash.  Neurological:     General: No focal deficit present.     Mental Status: She is alert and oriented to person, place, and time.  Psychiatric:        Mood and Affect: Mood normal.        Behavior: Behavior normal.        Thought Content: Thought content normal.        Judgment: Judgment normal.     ED ECG REPORT   Date: 03/23/2020  Rate: 73bpm  Rhythm: normal sinus rhythm  QRS Axis: normal  Intervals: normal  ST/T Wave abnormalities: non-specific t-wave flattening in III, V2-V6  Conduction Disutrbances:none  Narrative Interpretation: sinus rhythm at 73bpm with non-specific t-wave changes as above, very comparable to previous ekg.   Old EKG Reviewed: unchanged  I have personally reviewed the EKG tracing and agree with the computerized printout as noted.   Assessment and Plan :   PDMP not reviewed this encounter.  1. Unstable angina (HCC)   2. Atypical chest pain   3. History of pulmonary embolism   4. Uncontrolled type 2 diabetes mellitus with hyperglycemia (HCC)     Patient has significant risk factors including uncontrolled diabetes, history of pulmonary embolism, recent normal endoscopy and therefore given her current symptoms has unstable angina.  She is also not on any anticoagulation and needs work-up for both unstable angina and rule out of a chest PE.  Patient was not given any additional nitroglycerin to avoid hypotension and in the setting of possible chest PE would not help either way.  Patient was agreeable to an ER evaluation, nurse Irving Burton transport the patient there by wheelchair.   Wallis Bamberg, PA-C 03/23/20  1512  

## 2020-03-23 NOTE — ED Triage Notes (Signed)
Pt presents to UC for chest pain, SOB, nausea since 1000 this morning. Pt took home nitroglycerin and tylenol at 1030 with minimal relief. Pt also states it feels like "something is sitting on my chest".

## 2020-03-24 DIAGNOSIS — R0789 Other chest pain: Secondary | ICD-10-CM | POA: Diagnosis not present

## 2020-03-24 LAB — D-DIMER, QUANTITATIVE: D-Dimer, Quant: 0.38 ug/mL-FEU (ref 0.00–0.50)

## 2020-03-24 NOTE — Discharge Instructions (Addendum)
You have been seen here for chest pain.  Labs and imaging all look reassuring.  I recommend that you follow-up with your cardiologist for further evaluation as the last time you saw her was 2 years ago.    Please continue to take all your at home medications as prescribed.  I recommend that you take over-the-counter pain medications like Tylenol or ibuprofen for pain as needed please follow the bottle.   I want you to come back to the emergency department if you develop chest pain, shortness of breath, uncontrolled nausea, vomiting, diarrhea, fever as the symptoms require further evaluation management.

## 2020-03-24 NOTE — ED Provider Notes (Signed)
[facility] EMERGENCY DEPARTMENT Provider Note   CSN: [id number] Arrival date & time: 03/23/20  1513     History Chief Complaint  Patient presents with  . Chest Pain    Karen Hicks is a 49 y.o. female.  HPI   Patient presents to the emergency department with chief complaint of right-sided chest pain as being on since yesterday morning around 10 AM.  Patient describes right-sided chest pain as a sharp crampy-like pain that is substernal and does not radiate.  Patient admits that she became diaphoretic, short of breath and nauseous for a few minutes and then went away.  Patient took a nitroglycerin as well as Tylenol and helped to decrease the pain.  Went to urgent care where she was evaluated and they felt that she needed any further evaluation emergency department.  Patient has been evaluated by a cardiologist in 2019 where she had a negative stress test.  Patient denies hormone use, long immobilization but admits to an endoscopy on 06/17. patient has significant medical history of diabetes, a PE in 19 94 due to surgery, hypertension.  Patient denies headache, fever, chills, sore throat, congestion, shortness of breath, abdominal pain, nausea, vomiting, pedal edema, leg pain.  Past Medical History:  Diagnosis Date  . Allergy   . Anemia    when she was young  . Arthritis   . Complication of anesthesia    takes her alittle longer to wake up  . GERD (gastroesophageal reflux disease)   . Irritable bowel   . Lactose intolerance   . Migraine   . PE (pulmonary embolism)    in 1994 meniscus repair.  Saw Dr. Susann Givens  . Pneumonia   . PONV (postoperative nausea and vomiting)   . Pre-diabetes   . Restless legs syndrome   . Sleep apnea    2007.  Uses the mask & machine when she needs it.  Dr. Susann Givens is aware    Patient Active Problem List   Diagnosis Date Noted  . Hyperlipidemia associated with type 2 diabetes mellitus (HCC) 10/06/2019  . Type 2 diabetes  mellitus without complications (HCC) 02/21/2018  . Chest pain 10/25/2017  . S/P TKR (total knee replacement), right 07/31/2016  . Status post total left knee replacement 12/14/2014  . Arthritis 07/21/2014  . GERD (gastroesophageal reflux disease) 06/12/2011  . Sleep apnea 06/12/2011  . Obesity (BMI 30-39.9) 06/12/2011  . Allergic rhinitis due to pollen 06/12/2011  . Migraine headache 06/12/2011    Past Surgical History:  Procedure Laterality Date  . ABDOMINAL HYSTERECTOMY  01/2006   FIBROIDS  . CESAREAN SECTION    . HARDWARE REMOVAL Left 08/03/2014   Procedure: HARDWARE REMOVAL;  Surgeon: Velna Ochs, MD;  Location: [facility] OR;  Service: Orthopedics;  Laterality: Left;  . HARDWARE REMOVAL Right 07/31/2016   Procedure: HARDWARE REMOVAL;  Surgeon: Marcene Corning, MD;  Location: [facility] OR;  Service: Orthopedics;  Laterality: Right;  . KNEE SURGERY Bilateral    multiple  . 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 right wrist arthroscopy  2009  . TOTAL KNEE ARTHROPLASTY Left 08/03/2014   dr Jerl Santos  . TOTAL KNEE ARTHROPLASTY Left 08/03/2014   Procedure: LEFT TOTAL KNEE ARTHROPLASTY;  Surgeon: Velna Ochs, MD;  Location: [facility];  Service: Orthopedics;  Laterality: Left;  . TOTAL KNEE ARTHROPLASTY Right 07/31/2016   Procedure: TOTAL KNEE ARTHROPLASTY;  Surgeon: Marcene Corning, MD;  Location: [facility];  Service: Orthopedics;  Laterality: Right;     OB History   No obstetric history on file.     Family History  Problem Relation Age of Onset  . Arthritis Mother   . Diabetes Mother   . Heart disease Mother   . Hypertension Mother   . Kidney disease Mother   . Stroke Mother   . Gallbladder disease Mother   . Glaucoma Mother   . Seizures Mother   . Migraines Mother   . Arthritis Father   . Diabetes Father   . Hypertension Father   . Kidney disease Father   . Gallbladder disease Father   . Migraines  Sister   . Migraines Brother   . Healthy Son     Social History   Tobacco Use  . Smoking status: Never Smoker  . Smokeless tobacco: Never Used  Substance Use Topics  . Alcohol use: Yes    Comment: "with a good steak"  . Drug use: No    Home Medications Prior to Admission medications   Medication Sig Start Date End Date Taking? Authorizing Provider  Colloidal Oatmeal (AVEENO ECZEMA THERAPY EX) Apply 1 application topically 2 (two) times daily.   Yes [provider]  dapagliflozin propanediol (FARXIGA) 5 MG TABS tablet Take 1 tablet (5 mg total) by mouth daily before breakfast. 03/08/20  Yes Ronnald NianLalonde, John C, MD  diphenhydrAMINE (BENADRYL) 25 MG tablet Take 25 mg by mouth daily as needed for allergies.   Yes [provider]  esomeprazole (NEXIUM) 20 MG capsule Take 1 capsule (20 mg total) by mouth daily before breakfast. Patient taking differently: Take 20 mg by mouth 2 (two) times daily as needed (for heartburn).  06/10/13  Yes Ronnald NianLalonde, John C, MD  lisinopril (PRINIVIL,ZESTRIL) 5 MG tablet Take 1 tablet (5 mg total) by mouth daily. 10/15/18  Yes Ronnald NianLalonde, John C, MD  loratadine (CLARITIN) 10 MG tablet Take 10 mg by mouth daily as needed for allergies.   Yes [provider]  metFORMIN (GLUCOPHAGE) 1000 MG tablet Take 1 tablet (1,000 mg total) by mouth daily with breakfast. 03/08/20  Yes Ronnald NianLalonde, John C, MD  promethazine (PHENERGAN) 12.5 MG tablet Take 1-2 tablets (12.5-25 mg total) by mouth every 6 (six) hours as needed for nausea or vomiting. 08/19/18  Yes Elodia FlorenceNida, Andrew, PA-C  rosuvastatin (CRESTOR) 40 MG tablet Take 1 tablet (40 mg total) by mouth daily. 12/31/17  Yes Ronnald NianLalonde, John C, MD  TURMERIC PO Take 1 tablet by mouth daily.   Yes [provider]  venlafaxine (EFFEXOR) 37.5 MG tablet Take 37.5 mg by mouth at bedtime.  12/16/19  Yes [provider]  zolmitriptan (ZOMIG) 5 MG nasal solution PLACE 1 SPRAY INTO THE NOSE AS NEEDED FOR  MIGRAINE. Patient taking differently: Place 1 spray into the nose daily as needed for migraine.  10/29/18  Yes Ronnald NianLalonde, John C, MD  azithromycin (ZITHROMAX) 500 MG tablet Take 1 tablet (500 mg total) by mouth daily. Patient not taking: Reported on 10/15/2018 08/15/18   Ronnald NianLalonde, John C, MD  erythromycin ophthalmic ointment Place 1 application into the left eye 3 (three) times daily. Patient not taking: Reported on 09/30/2019 07/06/19   Ronnald NianLalonde, John C, MD  glucose blood test strip Use as instructed 01/21/18   Ronnald NianLalonde, John C, MD  HYDROcodone-acetaminophen [facility](NORCO) 5-325 MG tablet Take 1-2 tablets by mouth every 6 (six) hours as needed for moderate pain. Patient not taking: Reported on 07/06/2019 08/19/18   Elodia FlorenceNida, Andrew,  PA-C  pseudoephedrine (SUDAFED) 30 MG tablet Take 30 mg by mouth every 4 (four) hours as needed for congestion. Patient not taking: Reported on 02/23/2020    [provider]    Allergies    Mushroom extract complex, Peach flavor, Adhesive [tape], Penicillins, and Latex  Review of Systems   Review of Systems  Constitutional: Negative for chills and fever.  HENT: Negative for congestion and sore throat.   Eyes: Negative for visual disturbance.  Respiratory: Negative for cough and shortness of breath.   Cardiovascular: Positive for chest pain. Negative for palpitations and leg swelling.  Gastrointestinal: Negative for abdominal pain, diarrhea, nausea and vomiting.  Genitourinary: Negative for enuresis.  Musculoskeletal: Negative for back pain and joint swelling.  Skin: Negative for rash.  Neurological: Negative for dizziness and headaches.  Hematological: Does not bruise/bleed easily.    Physical Exam Updated Vital Signs BP (!) 144/75   Pulse 68   Temp 98.1 F (36.7 C) (Oral)   Resp 14   Ht 5\' 7"  (1.702 m)   Wt 89.1 kg   SpO2 100%   BMI 30.77 kg/m   Physical Exam Vitals and nursing note reviewed.  Constitutional:      General: She is not in acute  distress.    Appearance: She is not ill-appearing.  HENT:     Head: Normocephalic and atraumatic.     Nose: No congestion.     Mouth/Throat:     Mouth: Mucous membranes are moist.     Pharynx: Oropharynx is clear.  Eyes:     General: No scleral icterus. Cardiovascular:     Rate and Rhythm: Normal rate and regular rhythm.     Pulses: Normal pulses.     Heart sounds: No murmur heard.  No friction rub. No gallop.      Comments: Patient chest was visualized, good rise and fall during breathing, no flail chest sign noted.  Patient had point tenderness on the right side of her sternum along the fourth rib.  Chest pain was reproducible upon palpation, no swelling, ecchymosis, lacerations or other gross abnormalities noted. Pulmonary:     Effort: No respiratory distress.     Breath sounds: No wheezing, rhonchi or rales.  Abdominal:     General: There is no distension.     Tenderness: There is no abdominal tenderness. There is no guarding.  Musculoskeletal:        General: No swelling or tenderness.     Right lower leg: No edema.     Left lower leg: No edema.  Skin:    General: Skin is warm and dry.     Capillary Refill: Capillary refill takes less than 2 seconds.     Findings: No rash.  Neurological:     Mental Status: She is alert.  Psychiatric:        Mood and Affect: Mood normal.     ED Results / Procedures / Treatments   Labs (all labs ordered are listed, but only abnormal results are displayed) Labs Reviewed  BASIC METABOLIC PANEL - Abnormal; Notable for the following components:      Result Value   Glucose, Bld 136 (*)    All other components within normal limits  CBC  D-DIMER, QUANTITATIVE (NOT AT [facility])  I-STAT BETA HCG BLOOD, ED ([facility], [facility], [facility] ONLY)  TROPONIN I (HIGH SENSITIVITY)  TROPONIN I (HIGH SENSITIVITY)    EKG EKG Interpretation  Date/Time:  Wednesday March 23 2020 16:04:52 EDT Ventricular Rate:  67  PR Interval:  138 QRS Duration: 98 QT  Interval:  422 QTC Calculation: 445 R Axis:   72 Text Interpretation: Normal sinus rhythm Normal ECG Since last tracing of earlier today No significant change was found Confirmed by Mancel Bale [phone]) on 03/24/2020 10:21:28 AM   Radiology DG Chest 2 View  Result Date: 03/23/2020 CLINICAL DATA:  Chest pain.  Shortness of breath. EXAM: CHEST - 2 VIEW COMPARISON:  08/02/2016 FINDINGS: The heart size and mediastinal contours are within normal limits. Both lungs are clear. The visualized skeletal structures are unremarkable. IMPRESSION: No active cardiopulmonary disease. Electronically Signed   By: Norva Pavlov M.D.   On: 03/23/2020 17:39    Procedures Procedures (including critical care time)  Medications Ordered in ED Medications  sodium chloride flush (NS) 0.9 % injection 3 mL (has no administration in time range)    ED Course  I have reviewed the triage vital signs and the nursing notes.  Pertinent labs & imaging results that were available during my care of the patient were reviewed by me and considered in my medical decision making (see chart for details).    MDM Rules/Calculators/A&P                          I have personally reviewed all imaging, labs and have interpreted them.  Due to patient complaint most concern for cardiac abnormality versus pneumonia versus PE.  Unlikely patient suffering from a PE as she denies pleuritic chest pain, leg swelling, leg pain, D-dimer came back within normal limits.  Unlikely patient suffering from pneumonia as chest x-ray showed no acute abnormalities, no infiltrates, edema, consolidations, widening mediastinum.  Unlikely patient suffering from a cardiac abnormality as patient had delta troponin of 0, EKG showed sinus rhythm without signs of ischemia.  Chest pain is more skeletal muscular in nature can reproduce upon palpation unlikely this is cardiac related.  Due to patient's nontoxic-appearing, stable vital signs and reassuring  physical exam further lab work and imaging was not indicated.  Patient appears to be resting calmly in bed showing no acute signs distress.  Vital signs remained stable, she does not meet criteria to be admitted to the hospital.  Patient's chest pain differential includes costochondritis versus muscular strain versus acid reflux versus anxiety recommend that patient follows up with her cardiologist due to cardiac risk factors  diabetes and hypertension.  Patient was discussed with attending who agrees with assessment and plan.  Patient was given at home care as well as strict return precautions.  Patient verbalized that she understood and agrees the plan. Final Clinical Impression(s) / ED Diagnoses Final diagnoses:  Right-sided chest wall pain    Rx / DC Orders ED Discharge Orders    None       Carroll Sage, PA-C 03/24/20 1417    Mancel Bale, MD 03/24/20 [phone]

## 2020-03-25 ENCOUNTER — Telehealth: Payer: Self-pay | Admitting: *Deleted

## 2020-03-25 NOTE — Telephone Encounter (Signed)
2nd attempt to contact pt to complete transition of care assessment; left message on voicemail. Katrice Chandy Tarman, RN, BSN, CCRN  [facility] [phone]   

## 2020-03-25 NOTE — Telephone Encounter (Signed)
Attempted to contact pt to complete transition of care assessment; left message on voicemail.  Katrice Olanrewaju Osborn, RN, BSN, CCRN [facility] [phone]  

## 2020-03-31 ENCOUNTER — Other Ambulatory Visit: Payer: Self-pay | Admitting: Family Medicine

## 2020-04-19 ENCOUNTER — Ambulatory Visit: Payer: BC Managed Care – PPO | Admitting: Physician Assistant

## 2020-04-19 ENCOUNTER — Encounter: Payer: Self-pay | Admitting: Physician Assistant

## 2020-04-19 ENCOUNTER — Other Ambulatory Visit: Payer: Self-pay

## 2020-04-19 VITALS — BP 122/80 | HR 74 | Ht 67.0 in | Wt 198.0 lb

## 2020-04-19 DIAGNOSIS — E785 Hyperlipidemia, unspecified: Secondary | ICD-10-CM

## 2020-04-19 DIAGNOSIS — R0789 Other chest pain: Secondary | ICD-10-CM | POA: Diagnosis not present

## 2020-04-19 DIAGNOSIS — E119 Type 2 diabetes mellitus without complications: Secondary | ICD-10-CM | POA: Diagnosis not present

## 2020-04-19 DIAGNOSIS — E669 Obesity, unspecified: Secondary | ICD-10-CM | POA: Diagnosis not present

## 2020-04-19 DIAGNOSIS — K219 Gastro-esophageal reflux disease without esophagitis: Secondary | ICD-10-CM

## 2020-04-19 DIAGNOSIS — E1169 Type 2 diabetes mellitus with other specified complication: Secondary | ICD-10-CM

## 2020-04-19 NOTE — Progress Notes (Signed)
Cardiology Office Note    Date:  04/19/2020   ID:  Karen Hicks, DOB 29-Dec-1970, MRN [id number]  PCP:  Ronnald Nian, MD  Cardiologist:  Dr. Mayford Knife  Chief Complaint: ER follow up  History of Present Illness:   Karen Hicks is a 49 y.o. female with a history of PE after a meniscus repair, DM, HLD, fatty liver disease and OSA (now off CPAP) seen for ER follow up.   Seen for Dr. Mayford Knife once 10/2017 for chest pain which felt atypical. Follow up echo 11/2017 showed normal LVEF. Stress echo 11/2017 showed "False positive EKG and no ischemia" per Dr. Mayford Knife.   Seen in ER 03/24/20 for chest pain. Felt pain more likely MSK in nature. Discharge from ER. Troponin and d-dimer negative. EKG non ischemic (chornic non specific TWI in anterior leads).   Patient had episode of chest pain 7/22 while at work.  She bent over and suddenly felt shortness of breath with sharp right-sided chest pain.  She took Tylenol and sublingual nitroglycerin x1 without improvement.  Symptoms lasted for greater than 2 hours and evaluated in the ER as above.  She ruled out and discharged.  Her felt more consistent with musculoskeletal in nature.  No recurrence since then.  She is very active at baseline.  Does light cardio workout, lifting and push mowing lawn.  In ER her pain was reproducible with palpation.  Past Medical History:  Diagnosis Date  . Allergy   . Anemia    when she was young  . Arthritis   . Complication of anesthesia    takes her alittle longer to wake up  . GERD (gastroesophageal reflux disease)   . Irritable bowel   . Lactose intolerance   . Migraine   . PE (pulmonary embolism)    in 1994 meniscus repair.  Saw Dr. Susann Givens  . Pneumonia   . PONV (postoperative nausea and vomiting)   . Pre-diabetes   . Restless legs syndrome   . Sleep apnea    2007.  Uses the mask & machine when she needs it.  Dr. Susann Givens is aware    Past Surgical History:  Procedure Laterality Date  .  ABDOMINAL HYSTERECTOMY  01/2006   FIBROIDS  . CESAREAN SECTION    . HARDWARE REMOVAL Left 08/03/2014   Procedure: HARDWARE REMOVAL;  Surgeon: Velna Ochs, MD;  Location: [facility] OR;  Service: Orthopedics;  Laterality: Left;  . HARDWARE REMOVAL Right 07/31/2016   Procedure: HARDWARE REMOVAL;  Surgeon: Marcene Corning, MD;  Location[facility];  Service: Orthopedics;  Laterality: Right;  . KNEE SURGERY Bilateral    multiple  . 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 right wrist arthroscopy  2009  . TOTAL KNEE ARTHROPLASTY Left 08/03/2014   dr Jerl Santos  . TOTAL KNEE ARTHROPLASTY Left 08/03/2014   Procedure: LEFT TOTAL KNEE ARTHROPLASTY;  Surgeon: Velna Ochs, MD;  Location: [facility] OR;  Service: Orthopedics;  Laterality: Left;  . TOTAL KNEE ARTHROPLASTY Right 07/31/2016   Procedure: TOTAL KNEE ARTHROPLASTY;  Surgeon: Marcene Corning, MD;  Location: [facility] OR;  Service: Orthopedics;  Laterality: Right;    Current Medications: Prior to Admission medications   Medication Sig Start Date End Date Taking? Authorizing Provider  azithromycin (ZITHROMAX) 500 MG tablet Take 1 tablet (500 mg total) by mouth daily. Patient not taking: Reported on 10/15/2018 08/15/18   Ronnald Nian, MD  Colloidal Oatmeal (  AVEENO ECZEMA THERAPY EX) Apply 1 application topically 2 (two) times daily.    [provider]  dapagliflozin propanediol (FARXIGA) 5 MG TABS tablet Take 1 tablet (5 mg total) by mouth daily before breakfast. 03/08/20   Ronnald Nian, MD  diphenhydrAMINE (BENADRYL) 25 MG tablet Take 25 mg by mouth daily as needed for allergies.    [provider]  erythromycin ophthalmic ointment Place 1 application into the left eye 3 (three) times daily. Patient not taking: Reported on 09/30/2019 07/06/19   Ronnald Nian, MD  esomeprazole (NEXIUM) 20 MG capsule Take 1 capsule (20 mg total) by mouth daily before  breakfast. Patient taking differently: Take 20 mg by mouth 2 (two) times daily as needed (for heartburn).  06/10/13   Ronnald Nian, MD  glucose blood test strip Use as instructed 01/21/18   Ronnald Nian, MD  HYDROcodone-acetaminophen [facility]) 5-325 MG tablet Take 1-2 tablets by mouth every 6 (six) hours as needed for moderate pain. Patient not taking: Reported on 07/06/2019 08/19/18   Elodia Florence, PA-C  lisinopril (PRINIVIL,ZESTRIL) 5 MG tablet Take 1 tablet (5 mg total) by mouth daily. 10/15/18   Ronnald Nian, MD  loratadine (CLARITIN) 10 MG tablet Take 10 mg by mouth daily as needed for allergies.    [provider]  metFORMIN (GLUCOPHAGE) 1000 MG tablet TAKE 1 TABLET BY MOUTH EVERY DAY WITH BREAKFAST 03/31/20   Ronnald Nian, MD  promethazine (PHENERGAN) 12.5 MG tablet Take 1-2 tablets (12.5-25 mg total) by mouth every 6 (six) hours as needed for nausea or vomiting. 08/19/18   Elodia Florence, PA-C  pseudoephedrine (SUDAFED) 30 MG tablet Take 30 mg by mouth every 4 (four) hours as needed for congestion. Patient not taking: Reported on 02/23/2020    [provider]  rosuvastatin (CRESTOR) 40 MG tablet Take 1 tablet (40 mg total) by mouth daily. 12/31/17   Ronnald Nian, MD  TURMERIC PO Take 1 tablet by mouth daily.    [provider]  venlafaxine (EFFEXOR) 37.5 MG tablet Take 37.5 mg by mouth at bedtime.  12/16/19   [provider]  zolmitriptan (ZOMIG) 5 MG nasal solution PLACE 1 SPRAY INTO THE NOSE AS NEEDED FOR MIGRAINE. Patient taking differently: Place 1 spray into the nose daily as needed for migraine.  10/29/18   Ronnald Nian, MD    Allergies:   Mushroom extract complex, Peach flavor, Adhesive [tape], Penicillins, and Latex   Social History   Socioeconomic History  . Marital status: Single    Spouse name: Not on file  . Number of children: Not on file  . Years of education: Not on file  . Highest education level: Not on file  Occupational  History  . Not on file  Tobacco Use  . Smoking status: Never Smoker  . Smokeless tobacco: Never Used  Substance and Sexual Activity  . Alcohol use: Yes    Comment: "with a good steak"  . Drug use: No  . Sexual activity: Not Currently  Other Topics Concern  . Not on file  Social History Narrative   Lives with son in a one story home.  Works as a Loss adjuster, chartered at [employer].  Education: Masters   Chemical engineer Strain:   . Difficulty of Paying Living Expenses:   Food Insecurity:   . Worried About Programme researcher, broadcasting/film/video in the Last Year:   . [employer] of Food in the Last  Year:   Transportation Needs:   . Freight forwarder (Medical):   Marland Kitchen Lack of Transportation (Non-Medical):   Physical Activity:   . Days of Exercise per Week:   . Minutes of Exercise per Session:   Stress:   . Feeling of Stress :   Social Connections:   . Frequency of Communication with Friends and Family:   . Frequency of Social Gatherings with Friends and Family:   . Attends Religious Services:   . Active Member of Clubs or Organizations:   . Attends Banker Meetings:   Marland Kitchen Marital Status:      Family History:  The patient's family history includes Arthritis in her father and mother; Diabetes in her father and mother; Gallbladder disease in her father and mother; Glaucoma in her mother; Healthy in her son; Heart disease in her mother; Hypertension in her father and mother; Kidney disease in her father and mother; Migraines in her brother, mother, and sister; Seizures in her mother; Stroke in her mother.  ROS:   Please see the history of present illness.    ROS All other systems reviewed and are negative.   PHYSICAL EXAM:   VS:  BP 122/80   Pulse 74   Ht 5\' 7"  (1.702 m)   Wt 198 lb (89.8 kg)   SpO2 96%   BMI 31.01 kg/m    GEN: Well nourished, well developed, in no acute distress  HEENT: normal  Neck: no JVD, carotid bruits, or masses Cardiac: RRR; no  murmurs, rubs, or gallops,no edema  Respiratory:  clear to auscultation bilaterally, normal work of breathing GI: soft, nontender, nondistended, + BS MS: no deformity or atrophy  Skin: warm and dry, no rash Neuro:  Alert and Oriented x 3, Strength and sensation are intact Psych: euthymic mood, full affect  Wt Readings from Last 3 Encounters:  04/19/20 198 lb (89.8 kg)  03/23/20 196 lb 6.9 oz (89.1 kg)  02/23/20 196 lb 6.4 oz (89.1 kg)      Studies/Labs Reviewed:   EKG:  EKG is not  ordered today.   Recent Labs: 10/06/2019: ALT 83 03/23/2020: BUN 10; Creatinine, Ser 0.75; Hemoglobin 13.8; Platelets 251; Potassium 4.7; Sodium 137   Lipid Panel    Component Value Date/Time   CHOL 222 (H) 10/06/2019 0909   TRIG 275 (H) 10/06/2019 0909   HDL 38 (L) 10/06/2019 0909   CHOLHDL 5.8 (H) 10/06/2019 0909   CHOLHDL 6.9 06/10/2013 1413   VLDL 47 (H) 06/10/2013 1413   LDLCALC 135 (H) 10/06/2019 0909    Additional studies/ records that were reviewed today include:   Echocardiogram: 11/2017 Study Conclusions   - Left ventricle: The cavity size was normal. Wall thickness was  normal. Systolic function was normal. The estimated ejection  fraction was in the range of 55% to 60%. Wall motion was normal;  there were no regional wall motion abnormalities. The study is  not technically sufficient to allow evaluation of LV diastolic  function.   Impressions:   - Normal LV function; no significant valvular disease.   Stress echocardiogram 11/12/2017 Study Conclusions   - Stress ECG conclusions: There were no stress arrhythmias or  conduction abnormalities. The stress ECG was consistent with  myocardial ischemia.  - Staged echo: There was no echocardiographic evidence for  stress-induced ischemia.   Impressions:   - Stress echocardiogram with no chest pain; significant artifact on  ECGs but there appears to be 2 mm ST depression in the inferior  lateral leads with  peak exertion (positive ECG abnormalities);  however no stress-induced wall motion abnormalities noted on echo  images.     ASSESSMENT & PLAN:    1. Chest pain Noncardiac in reason.  Patient has no exertional chest pain or shortness of breath.  Patient does upper extremity activity and has intermittent chest pain with palpation.  Her symptoms more consistent with musculoskeletal in nature.  She has fatty liver disease and GERD.  She takes Nexium every day.  I have recommended observing her symptoms. -Reassuring stress echocardiogram 2 years ago.  2.  Diabetes mellitus -Followed by PCP.  3.  Hyperlipidemia -Continue statin.    Medication Adjustments/Labs and Tests Ordered: Current medicines are reviewed at length with the patient today.  Concerns regarding medicines are outlined above.  Medication changes, Labs and Tests ordered today are listed in the Patient Instructions below. Patient Instructions  Medication Instructions:  Your physician recommends that you continue on your current medications as directed. Please refer to the Current Medication list given to you today.  *If you need a refill on your cardiac medications before your next appointment, please call your pharmacy*   Lab Work: None ordered  If you have labs (blood work) drawn today and your tests are completely normal, you will receive your results only by: Marland Kitchen. MyChart Message (if you have MyChart) OR . A paper copy in the mail If you have any lab test that is abnormal or we need to change your treatment, we will call you to review the results.   Testing/Procedures: None ordered    Follow-Up: At [facility], you and your health needs are our priority.  As part of our continuing mission to provide you with exceptional heart care, we have created designated Provider Care Teams.  These Care Teams include your primary Cardiologist (physician) and Advanced Practice Providers (APPs -  Physician Assistants and  Nurse Practitioners) who all work together to provide you with the care you need, when you need it.  We recommend signing up for the patient portal called "MyChart".  Sign up information is provided on this After Visit Summary.  MyChart is used to connect with patients for Virtual Visits (Telemedicine).  Patients are able to view lab/test results, encounter notes, upcoming appointments, etc.  Non-urgent messages can be sent to your provider as well.   To learn more about what you can do with MyChart, go to [email or web address].    Your next appointment:   6 month(s)  The format for your next appointment:   In Person  Provider:   You may see Dr. Armanda Magicraci Turner, or one of the following Advanced Practice Providers on your designated Care Team:    Ronie Spiesayna Dunn, PA-C  Jacolyn ReedyMichele Lenze, PA-C    Other Instructions      Signed, Manson PasseyBhavinkumar Song Garris, [place]  04/19/2020 8:54 AM    [facility] [address], [place], [place]  [address] Phone: [phone]) [phone]; Fax: [phone]) [phone]

## 2020-04-19 NOTE — Patient Instructions (Addendum)
Medication Instructions:  Your physician recommends that you continue on your current medications as directed. Please refer to the Current Medication list given to you today.  *If you need a refill on your cardiac medications before your next appointment, please call your pharmacy*   Lab Work: None ordered  If you have labs (blood work) drawn today and your tests are completely normal, you will receive your results only by: Marland Kitchen MyChart Message (if you have MyChart) OR . A paper copy in the mail If you have any lab test that is abnormal or we need to change your treatment, we will call you to review the results.   Testing/Procedures: None ordered    Follow-Up: At [facility], you and your health needs are our priority.  As part of our continuing mission to provide you with exceptional heart care, we have created designated Provider Care Teams.  These Care Teams include your primary Cardiologist (physician) and Advanced Practice Providers (APPs -  Physician Assistants and Nurse Practitioners) who all work together to provide you with the care you need, when you need it.  We recommend signing up for the patient portal called "MyChart".  Sign up information is provided on this After Visit Summary.  MyChart is used to connect with patients for Virtual Visits (Telemedicine).  Patients are able to view lab/test results, encounter notes, upcoming appointments, etc.  Non-urgent messages can be sent to your provider as well.   To learn more about what you can do with MyChart, go to [email or web address].    Your next appointment:   6 month(s)  The format for your next appointment:   In Person  Provider:   You may see Dr. Armanda Magic, or one of the following Advanced Practice Providers on your designated Care Team:    Ronie Spies, PA-C  Jacolyn Reedy, PA-C    Other Instructions

## 2020-05-10 ENCOUNTER — Other Ambulatory Visit: Payer: Self-pay | Admitting: Family Medicine

## 2020-05-12 ENCOUNTER — Encounter: Payer: Self-pay | Admitting: Family Medicine

## 2020-05-12 ENCOUNTER — Telehealth (INDEPENDENT_AMBULATORY_CARE_PROVIDER_SITE_OTHER): Payer: BC Managed Care – PPO | Admitting: Family Medicine

## 2020-05-12 VITALS — BP 136/82 | Temp 98.7°F | Wt 198.0 lb

## 2020-05-12 DIAGNOSIS — Z20822 Contact with and (suspected) exposure to covid-19: Secondary | ICD-10-CM | POA: Diagnosis not present

## 2020-05-12 NOTE — Progress Notes (Signed)
   Subjective:    Patient ID: Stacy Gardner, female    DOB: 1970-09-17, 49 y.o.   MRN: [id number]  HPI I connected with  Jaiyah Graham-Allen on 05/12/20 by a video enabled telemedicine application and verified that I am speaking with the correct person using two identifiers.caregility used.  She was at home and I am in my office. I discussed the limitations of evaluation and management by telemedicine. The patient expressed understanding and agreed to proceed. She has a several day history of started with coughing, rhinorrhea, sinus congestion, fatigue, malaise and myalgias.  She has lost her sense of taste.  She has had both Covid vaccines.  Yesterday she did have a rapid test done which was negative and it was sent off for PCR.  She is waiting on those results.   Review of Systems     Objective:   Physical Exam Alert and in no distress.  Her respiratory pattern appears normal      Assessment & Plan:  COVID-19 virus RNA test result unknown Recommend she continue with Tylenol Cold and sinus as well as Rhinocort.  She is to call me with the results.  If it is positive, I will set her up to get IV antibiotics.  She was comfortable with that.

## 2020-05-13 ENCOUNTER — Encounter: Payer: Self-pay | Admitting: Family Medicine

## 2020-05-18 ENCOUNTER — Encounter: Payer: Self-pay | Admitting: Pulmonary Disease

## 2020-05-18 ENCOUNTER — Ambulatory Visit: Payer: BC Managed Care – PPO | Admitting: Pulmonary Disease

## 2020-05-18 ENCOUNTER — Other Ambulatory Visit: Payer: Self-pay

## 2020-05-18 VITALS — BP 128/80 | HR 86 | Temp 97.5°F | Ht 66.0 in | Wt 196.6 lb

## 2020-05-18 DIAGNOSIS — R0602 Shortness of breath: Secondary | ICD-10-CM

## 2020-05-18 DIAGNOSIS — K219 Gastro-esophageal reflux disease without esophagitis: Secondary | ICD-10-CM | POA: Diagnosis not present

## 2020-05-18 DIAGNOSIS — R079 Chest pain, unspecified: Secondary | ICD-10-CM | POA: Diagnosis not present

## 2020-05-18 MED ORDER — FLUTICASONE FUROATE-VILANTEROL 200-25 MCG/INH IN AEPB: 1.0000 | INHALATION_SPRAY | Freq: Every day | RESPIRATORY_TRACT | 0 refills | Status: DC

## 2020-05-18 NOTE — Progress Notes (Signed)
Karen Hicks    [id number]    Aug 05, 1971  Primary Care Physician:Lalonde, Everardo All, MD  Referring Physician: Ronnald Nian, MD [address] [place],  [place] [address]  Chief complaint: Consult for chest pain  HPI: 49 year old with history of GERD, asthma, PE, OSA Complains of dyspnea, right chest pain for 3 to 4 months.  Described sensation of sharp catching pain when she bends over or moves her shoulder.  Sometimes symptoms occur at rest as well.  No dyspnea, cough, mucus production  He has significant allergies, history of asthma.  She was on inhalers in the past but stopped as her symptoms were under good control  Evaluated in the ED on 03/23/2020 with negative D-dimer, troponin, EKG and normal chest x-ray Evaluated by cardiology Dr. Mayford Knife with normal echocardiogram She sees Dr. Elnoria Howard, GI for GERD  Pets: No pets Occupation: Has administrator for [employer] Exposures: No mold, hot tub, Jacuzzi.  No down pillows or comforters Smoking history: Never smoker Travel history: No significant travel history Relevant family history: No significant family history of lung disease   Outpatient Encounter Medications as of 05/18/2020  Medication Sig  . Colloidal Oatmeal (AVEENO ECZEMA THERAPY EX) Apply 1 application topically 2 (two) times daily.  . dapagliflozin propanediol (FARXIGA) 5 MG TABS tablet Take 1 tablet (5 mg total) by mouth daily before breakfast.  . diphenhydrAMINE (BENADRYL) 25 MG tablet Take 25 mg by mouth daily as needed for allergies.  Marland Kitchen esomeprazole (NEXIUM) 20 MG capsule Take 1 capsule (20 mg total) by mouth daily before breakfast. (Patient taking differently: Take 20 mg by mouth 2 (two) times daily as needed (for heartburn). )  . glucose blood test strip Use as instructed  . lisinopril (PRINIVIL,ZESTRIL) 5 MG tablet Take 1 tablet (5 mg total) by mouth daily.  Marland Kitchen loratadine (CLARITIN) 10 MG tablet Take 10 mg by mouth daily as needed for allergies.    . metFORMIN (GLUCOPHAGE) 1000 MG tablet TAKE 1 TABLET BY MOUTH EVERY DAY WITH BREAKFAST  . promethazine (PHENERGAN) 12.5 MG tablet Take 1-2 tablets (12.5-25 mg total) by mouth every 6 (six) hours as needed for nausea or vomiting.  . Pseudoephedrine-guaiFENesin (MUCINEX D MAX STRENGTH PO) Take by mouth.  . rosuvastatin (CRESTOR) 40 MG tablet Take 1 tablet (40 mg total) by mouth daily.  . TURMERIC PO Take 1 tablet by mouth daily.  Marland Kitchen venlafaxine (EFFEXOR) 37.5 MG tablet Take 37.5 mg by mouth at bedtime.   Marland Kitchen zolmitriptan (ZOMIG) 5 MG nasal solution PLACE 1 SPRAY INTO THE NOSE AS NEEDED FOR MIGRAINE. (Patient taking differently: Place 1 spray into the nose daily as needed for migraine. )   No facility-administered encounter medications on file as of 05/18/2020.    Allergies as of 05/18/2020 - Review Complete 05/18/2020  Allergen Reaction Noted  . Mushroom extract complex Anaphylaxis 07/21/2014  . Peach flavor Anaphylaxis   . Adhesive [tape] Hives 07/21/2014  . Penicillins Hives, Rash, and Other (See Comments) 06/11/2011  . Latex Itching 08/19/2018    Past Medical History:  Diagnosis Date  . Allergy   . Anemia    when she was young  . Arthritis   . Complication of anesthesia    takes her alittle longer to wake up  . GERD (gastroesophageal reflux disease)   . Irritable bowel   . Lactose intolerance   . Migraine   . PE (pulmonary embolism)    in 1994 meniscus repair.  Saw Dr. Susann Givens  .  Pneumonia   . PONV (postoperative nausea and vomiting)   . Pre-diabetes   . Restless legs syndrome   . Sleep apnea    2007.  Uses the mask & machine when she needs it.  Dr. Susann Givens is aware    Past Surgical History:  Procedure Laterality Date  . ABDOMINAL HYSTERECTOMY  01/2006   FIBROIDS  . CESAREAN SECTION    . HARDWARE REMOVAL Left 08/03/2014   Procedure: HARDWARE REMOVAL;  Surgeon: Velna Ochs, MD;  Location: [facility] OR;  Service: Orthopedics;  Laterality: Left;  . HARDWARE REMOVAL Right  07/31/2016   Procedure: HARDWARE REMOVAL;  Surgeon: Marcene Corning, MD;  Location[facility] OR;  Service: Orthopedics;  Laterality: Right;  . KNEE SURGERY Bilateral    multiple  . 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 right wrist arthroscopy  2009  . TOTAL KNEE ARTHROPLASTY Left 08/03/2014   dr Jerl Santos  . TOTAL KNEE ARTHROPLASTY Left 08/03/2014   Procedure: LEFT TOTAL KNEE ARTHROPLASTY;  Surgeon: Velna Ochs, MD;  Location: [facility] OR;  Service: Orthopedics;  Laterality: Left;  . TOTAL KNEE ARTHROPLASTY Right 07/31/2016   Procedure: TOTAL KNEE ARTHROPLASTY;  Surgeon: Marcene Corning, MD;  Location: [facility] OR;  Service: Orthopedics;  Laterality: Right;    Family History  Problem Relation Age of Onset  . Arthritis Mother   . Diabetes Mother   . Heart disease Mother   . Hypertension Mother   . Kidney disease Mother   . Stroke Mother   . Gallbladder disease Mother   . Glaucoma Mother   . Seizures Mother   . Migraines Mother   . Arthritis Father   . Diabetes Father   . Hypertension Father   . Kidney disease Father   . Gallbladder disease Father   . Migraines Sister   . Migraines Brother   . Healthy Son     Social History   Socioeconomic History  . Marital status: Single    Spouse name: Not on file  . Number of children: Not on file  . Years of education: Not on file  . Highest education level: Not on file  Occupational History  . Not on file  Tobacco Use  . Smoking status: Never Smoker  . Smokeless tobacco: Never Used  Substance and Sexual Activity  . Alcohol use: Yes    Comment: "with a good steak"  . Drug use: No  . Sexual activity: Not Currently  Other Topics Concern  . Not on file  Social History Narrative   Lives with son in a one story home.  Works as a Loss adjuster, chartered at [employer].  Education: Masters   Chemical engineer Strain:   . Difficulty of Paying  Living Expenses: Not on file  Food Insecurity:   . Worried About Programme researcher, broadcasting/film/video in the Last Year: Not on file  . Ran Out of Food in the Last Year: Not on file  Transportation Needs:   . Lack of Transportation (Medical): Not on file  . Lack of Transportation (Non-Medical): Not on file  Physical Activity:   . Days of Exercise per Week: Not on file  . Minutes of Exercise per Session: Not on file  Stress:   . Feeling of Stress : Not on file  Social Connections:   . Frequency of Communication with Friends and Family: Not on file  . Frequency of  Social Gatherings with Friends and Family: Not on file  . Attends Religious Services: Not on file  . Active Member of Clubs or Organizations: Not on file  . Attends Banker Meetings: Not on file  . Marital Status: Not on file  Intimate Partner Violence:   . Fear of Current or Ex-Partner: Not on file  . Emotionally Abused: Not on file  . Physically Abused: Not on file  . Sexually Abused: Not on file    Review of systems: Review of Systems  Constitutional: Negative for fever and chills.  HENT: Negative.   Eyes: Negative for blurred vision.  Respiratory: as per HPI  Cardiovascular: Negative for chest pain and palpitations.  Gastrointestinal: Negative for vomiting, diarrhea, blood per rectum. Genitourinary: Negative for dysuria, urgency, frequency and hematuria.  Musculoskeletal: Negative for myalgias, back pain and joint pain.  Skin: Negative for itching and rash.  Neurological: Negative for dizziness, tremors, focal weakness, seizures and loss of consciousness.  Endo/Heme/Allergies: Negative for environmental allergies.  Psychiatric/Behavioral: Negative for depression, suicidal ideas and hallucinations.  All other systems reviewed and are negative.  Physical Exam: Blood pressure 128/80, pulse 86, temperature (!) 97.5 F (36.4 C), temperature source Oral, height 5\' 6"  (1.676 m), weight 196 lb 9.6 oz (89.2 kg), SpO2 96  %. Gen:      No acute distress HEENT:  EOMI, sclera anicteric Neck:     No masses; no thyromegaly Lungs:    Clear to auscultation bilaterally; normal respiratory effort CV:         Regular rate and rhythm; no murmurs Abd:      + bowel sounds; soft, non-tender; no palpable masses, no distension Ext:    No edema; adequate peripheral perfusion Skin:      Warm and dry; no rash Neuro: alert and oriented x 3 Psych: normal mood and affect  Data Reviewed: Imaging: CT chest 01/12/2020-nodular atelectasis/scarring in the right middle lobe similar to 2009.  Lungs otherwise appear normal Chest x-ray 03/23/2020-no acute cardiopulmonary abnormality I have reviewed the images personally.  PFTs:  Labs: CBC 10/05/2018-WBC 6.8, eos 2%, absolute eosinophil count 136  Cardiac: Echo 11/12/2017 LVEF 55-60%, no significant valvular disease.  Normal RV function  Assessment:  Chest pain Appears atypical for asthma Probably musculoskeletal as pain is reproducible to palpation Recent imaging reviewed with stable right middle lobe atelectasis/scarring dating back to 2009 which would not explain current presentation.  Check CBC differential, IgE given history of allergies, asthma Trial Breo inhaler Schedule PFTs  OSA Stable on CPAP.  Follows with primary care  Plan/Recommendations: CBC, IgE PFTs 2010 MD [facility] 05/18/2020, 3:45 PM  CC: 05/20/2020, MD

## 2020-05-18 NOTE — Patient Instructions (Addendum)
We will check some labs today including CBC differential, IgE Schedule pulmonary function test We will trial Breo 200 inhaler  Follow-up in 3 months.  CBC, IgE PFTs Breo

## 2020-05-19 LAB — CBC WITH DIFFERENTIAL/PLATELET
Basophils Absolute: 0.1 10*3/uL (ref 0.0–0.1)
Basophils Relative: 1.3 % (ref 0.0–3.0)
Eosinophils Absolute: 0.2 10*3/uL (ref 0.0–0.7)
Eosinophils Relative: 2.7 % (ref 0.0–5.0)
HCT: 41.3 % (ref 36.0–46.0)
Hemoglobin: 13.6 g/dL (ref 12.0–15.0)
Lymphocytes Relative: 47.7 % — ABNORMAL HIGH (ref 12.0–46.0)
Lymphs Abs: 3.6 10*3/uL (ref 0.7–4.0)
MCHC: 32.9 g/dL (ref 30.0–36.0)
MCV: 85.2 fl (ref 78.0–100.0)
Monocytes Absolute: 0.3 10*3/uL (ref 0.1–1.0)
Monocytes Relative: 4.2 % (ref 3.0–12.0)
Neutro Abs: 3.3 10*3/uL (ref 1.4–7.7)
Neutrophils Relative %: 44.1 % (ref 43.0–77.0)
Platelets: 257 10*3/uL (ref 150.0–400.0)
RBC: 4.84 Mil/uL (ref 3.87–5.11)
RDW: 13.6 % (ref 11.5–15.5)
WBC: 7.5 10*3/uL (ref 4.0–10.5)

## 2020-05-19 LAB — IGE: IgE (Immunoglobulin E), Serum: 426 kU/L — ABNORMAL HIGH (ref <=114)

## 2020-05-30 ENCOUNTER — Ambulatory Visit: Payer: BC Managed Care – PPO | Admitting: Family Medicine

## 2020-05-30 ENCOUNTER — Encounter: Payer: Self-pay | Admitting: Family Medicine

## 2020-05-30 VITALS — BP 118/72 | HR 88 | Temp 97.5°F | Ht 65.0 in | Wt 196.2 lb

## 2020-05-30 DIAGNOSIS — E119 Type 2 diabetes mellitus without complications: Secondary | ICD-10-CM | POA: Diagnosis not present

## 2020-05-30 DIAGNOSIS — E669 Obesity, unspecified: Secondary | ICD-10-CM | POA: Diagnosis not present

## 2020-05-30 DIAGNOSIS — Z23 Encounter for immunization: Secondary | ICD-10-CM

## 2020-05-30 DIAGNOSIS — E785 Hyperlipidemia, unspecified: Secondary | ICD-10-CM

## 2020-05-30 DIAGNOSIS — E1169 Type 2 diabetes mellitus with other specified complication: Secondary | ICD-10-CM | POA: Diagnosis not present

## 2020-05-30 LAB — POCT GLYCOSYLATED HEMOGLOBIN (HGB A1C): Hemoglobin A1C: 8.1 % — AB (ref 4.0–5.6)

## 2020-05-30 MED ORDER — DAPAGLIFLOZIN PROPANEDIOL 5 MG PO TABS: 5.0000 mg | ORAL_TABLET | Freq: Every day | ORAL | 1 refills | Status: DC

## 2020-05-30 MED ORDER — TRULICITY 0.75 MG/0.5ML ~~LOC~~ SOAJ: 0.7500 mg | SUBCUTANEOUS | 6 refills | Status: DC

## 2020-05-30 NOTE — Progress Notes (Signed)
  Subjective:    Patient ID: Karen Hicks, female    DOB: [birth year], 49 y.o.   MRN: [id number]  Karen Hicks is a 49 y.o. female who presents for follow-up of Type 2 diabetes mellitus.  Home blood sugar records: meter records, fasting and post meal, 95-365 Current symptoms/problems include leg foot and hands cramping. Daily foot checks:yes   Any foot concerns: cramping  Exercise: yard work and walking  Diet:good He is now taking [place] and seems to doing quite nicely on that.  She also continues on Metformin 1000 mg/day.  She does recognize that when she eats the wrong kinds of foods, her blood sugars do shoot up.  She has been trying to get more physically active.  She continues on Crestor and is having no difficulty on that.  She is on low-dose lisinopril. The following portions of the patient's history were reviewed and updated as appropriate: allergies, current medications, past medical history, past social history and problem list.  ROS as in subjective above.     Objective:    Physical Exam Alert and in no distress otherwise not examined.  Hemoglobin A1c is 8.1 Lab Review Diabetic Labs Latest Ref Rng & Units 03/23/2020 02/23/2020 10/06/2019 10/15/2018 08/18/2018  HbA1c 4.0 - 5.6 % - 10.4(A) 9.5(A) 6.6(A) 6.9(H)  Microalbumin mg/L - - 18.8 - -  Micro/Creat Ratio - - - 6.4 - -  Chol 100 - 199 mg/dL - - 176(H) - -  HDL >60 mg/dL - - 73(X) - -  Calc LDL 0 - 99 mg/dL - - 106(Y) - -  Triglycerides 0 - 149 mg/dL - - 694(W) - -  Creatinine 0.44 - 1.00 mg/dL 5.46 - 2.70 - 3.50   BP/Weight 05/18/2020 05/12/2020 04/19/2020 03/24/2020 03/23/2020  Systolic BP 128 136 122 144 -  Diastolic BP 80 82 80 75 -  Wt. (Lbs) 196.6 198 198 - 196.43  BMI 31.73 31.01 31.01 - 30.77   Foot/eye exam completion dates Latest Ref Rng & Units 10/06/2019 08/12/2019  Eye Exam No Retinopathy - No Retinopathy  Foot Form Completion - Done -    Ambri  reports that she has never smoked. She has  never used smokeless tobacco. She reports current alcohol use. She reports that she does not use drugs.     Assessment & Plan:    Type 2 diabetes mellitus without complication, without long-term current use of insulin (HCC) - Plan: POCT glycosylated hemoglobin (Hb A1C), Dulaglutide (TRULICITY) 0.75 MG/0.5ML SOPN, dapagliflozin propanediol (FARXIGA) 5 MG TABS tablet  Obesity (BMI 30-39.9)  Hyperlipidemia associated with type 2 diabetes mellitus (HCC)  Need for Tdap vaccination - Plan: Tdap vaccine greater than or equal to 7yo IM  Need for influenza vaccination - Plan: Flu Vaccine QUAD 36+ mos IM   1. Rx changes: I will add Trulicity to her regimen. 2. Education: Reviewed 'ABCs' of diabetes management (respective goals in parentheses):  A1C (<7), blood pressure (<130/80), and cholesterol (LDL <100). 3. Compliance at present is estimated to be good. Efforts to improve compliance (if necessary) will be directed at increased exercise. 4. Follow up: 4 months I discussed various options including increasing her Marcelline Deist but think it is appropriate to add another medication to her regimen to help and this might also help with weight loss.  She was comfortable with this.  Her immunizations were updated.

## 2020-06-01 ENCOUNTER — Telehealth: Payer: Self-pay

## 2020-06-01 NOTE — Telephone Encounter (Signed)
Pt. Called stating she was here Monday got a flu shot and a Tdap. She got her flu shot in her lt. Arm and she said it has a red raised bump about the size of a nickel seems to be getting bigger not smaller. She has been icing it since Monday and taking tylenol for the pain. She wanted to know what to do or do you need to take a look at it.

## 2020-06-01 NOTE — Telephone Encounter (Signed)
Let her know that this is not unusual.  See how she is doing on Thursday

## 2020-06-02 ENCOUNTER — Encounter: Payer: Self-pay | Admitting: Family Medicine

## 2020-06-03 ENCOUNTER — Ambulatory Visit (INDEPENDENT_AMBULATORY_CARE_PROVIDER_SITE_OTHER): Payer: BC Managed Care – PPO | Admitting: Family Medicine

## 2020-06-03 ENCOUNTER — Other Ambulatory Visit: Payer: Self-pay

## 2020-06-03 VITALS — HR 102 | Wt 198.6 lb

## 2020-06-03 DIAGNOSIS — T8090XA Unspecified complication following infusion and therapeutic injection, initial encounter: Secondary | ICD-10-CM | POA: Diagnosis not present

## 2020-06-03 NOTE — Progress Notes (Signed)
   Subjective:    Patient ID: Karen Hicks, female    DOB: 11-02-1970, 49 y.o.   MRN: [id number]  HPI She is here for evaluation of left arm pain.  She did have an injection on September 27 and noted some pain and swelling in the left arm after the Tdap injection   Review of Systems     Objective:   Physical Exam Exam the left hand does show some slight warmth and tenderness with only slight erythema in the area of the injection.       Assessment & Plan:  Injection site reaction, initial encounter I reassured her that I did not think this was an infection.  Recommend conservative care.  She was comfortable with that.

## 2020-06-03 NOTE — Telephone Encounter (Signed)
Sent my chart message. Kh 

## 2020-06-10 ENCOUNTER — Other Ambulatory Visit: Payer: Self-pay

## 2020-06-10 ENCOUNTER — Ambulatory Visit (INDEPENDENT_AMBULATORY_CARE_PROVIDER_SITE_OTHER): Payer: BC Managed Care – PPO | Admitting: Pulmonary Disease

## 2020-06-10 DIAGNOSIS — K219 Gastro-esophageal reflux disease without esophagitis: Secondary | ICD-10-CM

## 2020-06-10 DIAGNOSIS — R0602 Shortness of breath: Secondary | ICD-10-CM

## 2020-06-10 LAB — PULMONARY FUNCTION TEST
DL/VA % pred: 162 %
DL/VA: 6.92 ml/min/mmHg/L
DLCO cor % pred: 117 %
DLCO cor: 27.15 ml/min/mmHg
DLCO unc % pred: 118 %
DLCO unc: 27.31 ml/min/mmHg
FEF 25-75 Post: 2.2 L/sec
FEF 25-75 Pre: 2.07 L/sec
FEF2575-%Change-Post: 6 %
FEF2575-%Pred-Post: 81 %
FEF2575-%Pred-Pre: 76 %
FEV1-%Change-Post: 4 %
FEV1-%Pred-Post: 84 %
FEV1-%Pred-Pre: 80 %
FEV1-Post: 2.18 L
FEV1-Pre: 2.09 L
FEV1FVC-%Change-Post: 0 %
FEV1FVC-%Pred-Pre: 98 %
FEV6-%Change-Post: 6 %
FEV6-%Pred-Post: 85 %
FEV6-%Pred-Pre: 80 %
FEV6-Post: 2.69 L
FEV6-Pre: 2.53 L
FEV6FVC-%Pred-Post: 102 %
FEV6FVC-%Pred-Pre: 102 %
FVC-%Change-Post: 3 %
FVC-%Pred-Post: 83 %
FVC-%Pred-Pre: 80 %
FVC-Post: 2.69 L
FVC-Pre: 2.6 L
Post FEV1/FVC ratio: 81 %
Post FEV6/FVC ratio: 100 %
Pre FEV1/FVC ratio: 80 %
Pre FEV6/FVC Ratio: 100 %
RV % pred: 65 %
RV: 1.23 L
TLC % pred: 71 %
TLC: 3.86 L

## 2020-06-10 NOTE — Progress Notes (Signed)
Full PFT performed today. °

## 2020-06-16 ENCOUNTER — Other Ambulatory Visit: Payer: Self-pay | Admitting: Family Medicine

## 2020-06-27 ENCOUNTER — Other Ambulatory Visit: Payer: Self-pay | Admitting: Family Medicine

## 2020-07-30 ENCOUNTER — Other Ambulatory Visit: Payer: Self-pay | Admitting: Family Medicine

## 2020-08-01 NOTE — Telephone Encounter (Signed)
[facility] is requesting to fill pt zomig. Please advise [facility]

## 2020-08-02 ENCOUNTER — Encounter: Payer: Self-pay | Admitting: Family Medicine

## 2020-08-02 ENCOUNTER — Other Ambulatory Visit: Payer: Self-pay

## 2020-08-02 ENCOUNTER — Ambulatory Visit: Payer: BC Managed Care – PPO | Admitting: Family Medicine

## 2020-08-02 VITALS — BP 128/84 | HR 72 | Temp 97.6°F | Wt 196.4 lb

## 2020-08-02 DIAGNOSIS — G43009 Migraine without aura, not intractable, without status migrainosus: Secondary | ICD-10-CM

## 2020-08-02 MED ORDER — NURTEC 75 MG PO TBDP: 1.0000 | ORAL_TABLET | ORAL | 0 refills | Status: AC

## 2020-08-02 NOTE — Progress Notes (Signed)
   Subjective:    Patient ID: Karen Hicks, female    DOB: November 23, 1970, 49 y.o.   MRN: [id number]  HPI She is here for evaluation and treatment of migraine headache.  She has a long history of difficulty with this and usually responds to Zomig.  She has uses daily for the last 3 days as well as NSAIDs without good relief of her symptoms.  She describes it as throbbing in the bilateral retro-orbital area with associated nausea photophobia and dysosmia.   Review of Systems     Objective:   Physical Exam Alert and in a dark room lying quietly.  Not otherwise examined       Assessment & Plan:  Migraine without aura and without status migrainosus, not intractable A sample of Nurtec given with instructions to repeated in 2 hours if needed.  If she continues have difficulty, she should return here around 4:00 for possible injection.

## 2020-08-29 ENCOUNTER — Ambulatory Visit: Payer: BC Managed Care – PPO | Admitting: Family Medicine

## 2020-09-03 LAB — HM DIABETES EYE EXAM

## 2020-09-05 ENCOUNTER — Ambulatory Visit: Payer: BC Managed Care – PPO | Admitting: Family Medicine

## 2020-09-05 ENCOUNTER — Encounter: Payer: Self-pay | Admitting: Family Medicine

## 2020-09-05 ENCOUNTER — Other Ambulatory Visit: Payer: Self-pay

## 2020-09-05 VITALS — BP 124/80 | HR 80 | Temp 97.3°F | Wt 202.2 lb

## 2020-09-05 DIAGNOSIS — E119 Type 2 diabetes mellitus without complications: Secondary | ICD-10-CM

## 2020-09-05 DIAGNOSIS — E1169 Type 2 diabetes mellitus with other specified complication: Secondary | ICD-10-CM | POA: Diagnosis not present

## 2020-09-05 DIAGNOSIS — E785 Hyperlipidemia, unspecified: Secondary | ICD-10-CM

## 2020-09-05 DIAGNOSIS — E669 Obesity, unspecified: Secondary | ICD-10-CM | POA: Diagnosis not present

## 2020-09-05 DIAGNOSIS — G43009 Migraine without aura, not intractable, without status migrainosus: Secondary | ICD-10-CM | POA: Diagnosis not present

## 2020-09-05 LAB — POCT GLYCOSYLATED HEMOGLOBIN (HGB A1C): Hemoglobin A1C: 7.3 % — AB (ref 4.0–5.6)

## 2020-09-05 MED ORDER — NURTEC 75 MG PO TBDP: 1.0000 | ORAL_TABLET | Freq: Every day | ORAL | 5 refills | Status: DC | PRN

## 2020-09-05 NOTE — Addendum Note (Signed)
Addended by: Renelda Loma on: 09/05/2020 05:43 PM   Modules accepted: Orders

## 2020-09-05 NOTE — Progress Notes (Signed)
  Subjective:    Patient ID: Karen Hicks, female    DOB: 1971-06-22, 50 y.o.   MRN: [id number]  Karen Hicks is a 50 y.o. female who presents for follow-up of Type 2 diabetes mellitus.  Home blood sugar records: meter records, fasting and 2 hours post meal 91-140 Current symptoms/problems include none at this time. Daily foot checks: yes   Any foot concerns: dryness both feet Exercise: staying active 30 min four days a week Diet: good She states that Nurtec is working quite well for her migraine headaches.  In the past she has tried Zomig, Relpax and Imitrex and this works  much better.  She knows what is triggering her migraines and finds the lemonade in.  She continues on Metformin and Trulicity and is having no difficulty with that.  Crestor is causing no trouble.  Life in general is going much better for her. The following portions of the patient's history were reviewed and updated as appropriate: allergies, current medications, past medical history, past social history and problem list.  ROS as in subjective above.     Objective:    Physical Exam Alert and in no distress otherwise not examined.  Hemoglobin A1c is 7.3 Lab Review Diabetic Labs Latest Ref Rng & Units 05/30/2020 03/23/2020 02/23/2020 10/06/2019 10/15/2018  HbA1c 4.0 - 5.6 % 8.1(A) - 10.4(A) 9.5(A) 6.6(A)  Microalbumin mg/L - - - 18.8 -  Micro/Creat Ratio - - - - 6.4 -  Chol 100 - 199 mg/dL - - - 638(V) -  HDL >56 mg/dL - - - 43(P) -  Calc LDL 0 - 99 mg/dL - - - 295(J) -  Triglycerides 0 - 149 mg/dL - - - 884(Z) -  Creatinine 0.44 - 1.00 mg/dL - 6.60 - 6.30 -   BP/Weight 08/02/2020 06/03/2020 05/30/2020 05/18/2020 05/12/2020  Systolic BP 128 - 118 128 136  Diastolic BP 84 - 72 80 82  Wt. (Lbs) 196.4 198.6 196.2 196.6 198  BMI 32.68 33.05 32.65 31.73 31.01   Foot/eye exam completion dates Latest Ref Rng & Units 10/06/2019 08/12/2019  Eye Exam No Retinopathy - No Retinopathy  Foot Form Completion - Done  -    Karen Hicks  reports that she has never smoked. She has never used smokeless tobacco. She reports current alcohol use. She reports that she does not use drugs.     Assessment & Plan:    Migraine without aura and without status migrainosus, not intractable - Plan: Rimegepant Sulfate (NURTEC) 75 MG TBDP  Type 2 diabetes mellitus without complication, without long-term current use of insulin (HCC)  Obesity (BMI 30-39.9)  Hyperlipidemia associated with type 2 diabetes mellitus (HCC)   1. Rx changes: Nurtec added to her regimen. 2. Education: Reviewed 'ABCs' of diabetes management (respective goals in parentheses):  A1C (<7), blood pressure (<130/80), and cholesterol (LDL <100). 3. Compliance at present is estimated to be good. Efforts to improve compliance (if necessary) will be directed at increased exercise. 4. Follow up: 4 months In general things are going much better for her.  She is very happy with the new migraine medication.

## 2020-09-27 ENCOUNTER — Encounter: Payer: Self-pay | Admitting: Family Medicine

## 2020-09-27 ENCOUNTER — Telehealth (INDEPENDENT_AMBULATORY_CARE_PROVIDER_SITE_OTHER): Payer: BC Managed Care – PPO | Admitting: Family Medicine

## 2020-09-27 ENCOUNTER — Other Ambulatory Visit: Payer: Self-pay

## 2020-09-27 VITALS — Temp 99.9°F | Wt 202.0 lb

## 2020-09-27 DIAGNOSIS — R0981 Nasal congestion: Secondary | ICD-10-CM

## 2020-09-27 NOTE — Progress Notes (Signed)
   Subjective:    Patient ID: Karen Hicks, female    DOB: [birth year], 50 y.o.   MRN: [id number]  HPI I connected with  Karen Hicks on 09/27/20 by a video enabled telemedicine application and verified that I am speaking with the correct person using two identifiers.  Caregility used Me;office patient  Her office I discussed the limitations of evaluation and management by telemedicine. The patient expressed understanding and agreed to proceed. Last Saturday she developed nasal congestion, PND and slight sore throat.  Sunday she did go for Covid testing and has a PCR that is pending.  On Monday she did an antigen test which was negative.  She has had no fever, chills, smell or taste change.  She does complain of some slight right-sided chest pressure but states that the pressure goes away when she gets up and is more physically active.  She had her Covid vaccine and got her booster in November.   Review of Systems     Objective:   Physical Exam Alert and in no distress otherwise not examined       Assessment & Plan:  Nasal congestion I explained that since her symptoms started on Saturday, by the time Friday runs around if she is feeling fine she would be able to return to work even if her test was positive.  She was at work and I did recommend she go home until we have the PCR test back.  She will call me let me know what the PCR is. 12 minutes spent in counseling concerning this.

## 2020-09-30 ENCOUNTER — Encounter: Payer: Self-pay | Admitting: Family Medicine

## 2020-09-30 LAB — NOVEL CORONAVIRUS, NAA: SARS-CoV-2, NAA: NEGATIVE

## 2020-10-12 ENCOUNTER — Encounter: Payer: Self-pay | Admitting: Family Medicine

## 2020-11-09 LAB — HM MAMMOGRAPHY

## 2020-11-22 ENCOUNTER — Encounter: Payer: Self-pay | Admitting: Family Medicine

## 2020-12-04 NOTE — Progress Notes (Signed)
  Subjective:    Patient ID: Karen Hicks, female    DOB: 1970-10-23, 50 y.o.   MRN: [id number]  Karen Hicks is a 50 y.o. female who presents for follow-up of Type 2 diabetes mellitus.  Home blood sugar records: meter records, fasting and post meal, 110-186 Current symptoms/problems include none at this time . Daily foot checks: yes   Any foot concerns: none Exercise: staying active.  She does walk regularly Diet: good She continues on Metformin, Trulicity and [place].  Uses Nexium twice per week.  Continues on Crestor as well as low-dose lisinopril. The following portions of the patient's history were reviewed and updated as appropriate: allergies, current medications, past medical history, past social history and problem list.  ROS as in subjective above.     Objective:    Physical Exam Alert and in no distress otherwise not examined.  Hemoglobin A1c is 8.2 Lab Review Diabetic Labs Latest Ref Rng & Units 12/05/2020 09/05/2020 05/30/2020 03/23/2020 02/23/2020  HbA1c 4.0 - 5.6 % 8.2(A) 7.3(A) 8.1(A) - 10.4(A)  Microalbumin mg/L - - - - -  Micro/Creat Ratio - - - - - -  Chol 100 - 199 mg/dL - - - - -  HDL >63 mg/dL - - - - -  Calc LDL 0 - 99 mg/dL - - - - -  Triglycerides 0 - 149 mg/dL - - - - -  Creatinine 1.49 - 1.00 mg/dL - - - 7.02 -   BP/Weight 12/05/2020 09/27/2020 09/05/2020 08/02/2020 06/03/2020  Systolic BP 128 - 124 128 -  Diastolic BP 80 - 80 84 -  Wt. (Lbs) 203.2 202 202.2 196.4 198.6  BMI 33.81 33.61 33.65 32.68 33.05   Foot/eye exam completion dates Latest Ref Rng & Units 09/03/2020 10/06/2019  Eye Exam No Retinopathy No Retinopathy -  Foot Form Completion - - Done    Zenobia  reports that she has never smoked. She has never used smokeless tobacco. She reports current alcohol use. She reports that she does not use drugs.     Assessment & Plan:    Type 2 diabetes mellitus without complication, without long-term current use of insulin (HCC) - Plan:  POCT glycosylated hemoglobin (Hb A1C), Dulaglutide (TRULICITY) 1.5 MG/0.5ML SOPN, dapagliflozin propanediol (FARXIGA) 10 MG TABS tablet  Obesity (BMI 30-39.9)  Hyperlipidemia associated with type 2 diabetes mellitus (HCC)    1. Rx changes: Increase both Farxiga and Trulicity. 2. Education: Reviewed 'ABCs' of diabetes management (respective goals in parentheses):  A1C (<7), blood pressure (<130/80), and cholesterol (LDL <100). 3. Compliance at present is estimated to be good. Efforts to improve compliance (if necessary) will be directed at Continue with present diet and exercise.. 4. Follow up: 4 months I explained that hopefully the increase in her SGLT2 and GLP-1 can help with her diabetes and with weight loss.

## 2020-12-05 ENCOUNTER — Ambulatory Visit: Payer: BC Managed Care – PPO | Admitting: Family Medicine

## 2020-12-05 ENCOUNTER — Encounter: Payer: Self-pay | Admitting: Family Medicine

## 2020-12-05 ENCOUNTER — Other Ambulatory Visit: Payer: Self-pay

## 2020-12-05 VITALS — BP 128/80 | HR 67 | Temp 97.8°F | Wt 203.2 lb

## 2020-12-05 DIAGNOSIS — E785 Hyperlipidemia, unspecified: Secondary | ICD-10-CM | POA: Diagnosis not present

## 2020-12-05 DIAGNOSIS — E1169 Type 2 diabetes mellitus with other specified complication: Secondary | ICD-10-CM

## 2020-12-05 DIAGNOSIS — E119 Type 2 diabetes mellitus without complications: Secondary | ICD-10-CM

## 2020-12-05 DIAGNOSIS — E669 Obesity, unspecified: Secondary | ICD-10-CM

## 2020-12-05 LAB — POCT GLYCOSYLATED HEMOGLOBIN (HGB A1C): Hemoglobin A1C: 8.2 % — AB (ref 4.0–5.6)

## 2020-12-05 MED ORDER — TRULICITY 1.5 MG/0.5ML ~~LOC~~ SOAJ: 1.5000 mg | SUBCUTANEOUS | 5 refills | Status: DC

## 2020-12-05 MED ORDER — DAPAGLIFLOZIN PROPANEDIOL 10 MG PO TABS: 10.0000 mg | ORAL_TABLET | Freq: Every day | ORAL | 1 refills | Status: DC

## 2021-01-17 ENCOUNTER — Emergency Department (HOSPITAL_BASED_OUTPATIENT_CLINIC_OR_DEPARTMENT_OTHER)
Admission: EM | Admit: 2021-01-17 | Discharge: 2021-01-17 | Disposition: A | Discharge status: Home / Self Care | Payer: BC Managed Care – PPO | Attending: Emergency Medicine | Admitting: Emergency Medicine

## 2021-01-17 ENCOUNTER — Emergency Department (HOSPITAL_BASED_OUTPATIENT_CLINIC_OR_DEPARTMENT_OTHER): Payer: BC Managed Care – PPO

## 2021-01-17 ENCOUNTER — Encounter (HOSPITAL_BASED_OUTPATIENT_CLINIC_OR_DEPARTMENT_OTHER): Payer: Self-pay | Admitting: Emergency Medicine

## 2021-01-17 ENCOUNTER — Other Ambulatory Visit: Payer: Self-pay

## 2021-01-17 DIAGNOSIS — Z96652 Presence of left artificial knee joint: Secondary | ICD-10-CM | POA: Diagnosis not present

## 2021-01-17 DIAGNOSIS — B9689 Other specified bacterial agents as the cause of diseases classified elsewhere: Secondary | ICD-10-CM | POA: Insufficient documentation

## 2021-01-17 DIAGNOSIS — Z91040 Latex allergy status: Secondary | ICD-10-CM | POA: Diagnosis not present

## 2021-01-17 DIAGNOSIS — N3090 Cystitis, unspecified without hematuria: Secondary | ICD-10-CM | POA: Diagnosis not present

## 2021-01-17 DIAGNOSIS — Z7984 Long term (current) use of oral hypoglycemic drugs: Secondary | ICD-10-CM | POA: Diagnosis not present

## 2021-01-17 DIAGNOSIS — E119 Type 2 diabetes mellitus without complications: Secondary | ICD-10-CM | POA: Diagnosis not present

## 2021-01-17 DIAGNOSIS — Z96651 Presence of right artificial knee joint: Secondary | ICD-10-CM | POA: Insufficient documentation

## 2021-01-17 DIAGNOSIS — R300 Dysuria: Secondary | ICD-10-CM | POA: Diagnosis present

## 2021-01-17 LAB — COMPREHENSIVE METABOLIC PANEL
ALT: 64 U/L — ABNORMAL HIGH (ref 0–44)
AST: 69 U/L — ABNORMAL HIGH (ref 15–41)
Albumin: 4.1 g/dL (ref 3.5–5.0)
Alkaline Phosphatase: 94 U/L (ref 38–126)
Anion gap: 8 (ref 5–15)
BUN: 12 mg/dL (ref 6–20)
CO2: 24 mmol/L (ref 22–32)
Calcium: 9.1 mg/dL (ref 8.9–10.3)
Chloride: 104 mmol/L (ref 98–111)
Creatinine, Ser: 0.69 mg/dL (ref 0.44–1.00)
GFR, Estimated: >60 mL/min (ref >60)
Glucose, Bld: 145 mg/dL — ABNORMAL HIGH (ref 70–99)
Potassium: 5.2 mmol/L — ABNORMAL HIGH (ref 3.5–5.1)
Sodium: 136 mmol/L (ref 135–145)
Total Bilirubin: 0.9 mg/dL (ref 0.3–1.2)
Total Protein: 7.7 g/dL (ref 6.5–8.1)

## 2021-01-17 LAB — CBC
HCT: 44.7 % (ref 36.0–46.0)
Hemoglobin: 14.9 g/dL (ref 12.0–15.0)
MCH: 28.4 pg (ref 26.0–34.0)
MCHC: 33.3 g/dL (ref 30.0–36.0)
MCV: 85.1 fL (ref 80.0–100.0)
Platelets: 237 10*3/uL (ref 150–400)
RBC: 5.25 MIL/uL — ABNORMAL HIGH (ref 3.87–5.11)
RDW: 13.2 % (ref 11.5–15.5)
WBC: 9.3 10*3/uL (ref 4.0–10.5)
nRBC: 0 % (ref 0.0–0.2)

## 2021-01-17 LAB — URINALYSIS, ROUTINE W REFLEX MICROSCOPIC
Glucose, UA: NEGATIVE mg/dL
Ketones, ur: NEGATIVE mg/dL
Nitrite: POSITIVE — AB
Protein, ur: >300 mg/dL — AB
Specific Gravity, Urine: >1.03 — ABNORMAL HIGH (ref 1.005–1.030)
pH: 5 (ref 5.0–8.0)

## 2021-01-17 LAB — LIPASE, BLOOD: Lipase: 40 U/L (ref 11–51)

## 2021-01-17 LAB — URINALYSIS, MICROSCOPIC (REFLEX): RBC / HPF: >50 RBC/hpf (ref 0–5)

## 2021-01-17 LAB — PREGNANCY, URINE: Preg Test, Ur: NEGATIVE

## 2021-01-17 MED ORDER — NITROFURANTOIN MONOHYD MACRO 100 MG PO CAPS
100.0000 mg | ORAL_CAPSULE | Freq: Once | ORAL | Status: AC
  Administered 2021-01-17: 100 mg via ORAL
  Filled 2021-01-17: qty 1

## 2021-01-17 MED ORDER — NITROFURANTOIN MONOHYD MACRO 100 MG PO CAPS: 100.0000 mg | ORAL_CAPSULE | Freq: Two times a day (BID) | ORAL | 0 refills | Status: AC

## 2021-01-17 MED ORDER — NITROFURANTOIN MONOHYD MACRO 100 MG PO CAPS: 100.0000 mg | ORAL_CAPSULE | Freq: Once | ORAL | Status: DC

## 2021-01-17 NOTE — ED Triage Notes (Signed)
Pt arrives pov with c/o abdominal pain, dysuria and hematuria that started this am.

## 2021-01-17 NOTE — ED Provider Notes (Signed)
[facility] EMERGENCY DEPARTMENT Provider Note   CSN: [id number] Arrival date & time: 01/17/21  3016     History Chief Complaint  Patient presents with  . Hematuria    Karen Hicks is a 50 y.o. female presented emergency department with dysuria and hematuria beginning this morning.  She reports she saw some blood in her urine.  This never happened before.  She reports burning when she urinates.  She denies nausea, vomiting, fevers, flank pain.  She denies any history of kidney stones personally.  She denies history of UTIs.  She otherwise feels well.  She is not on blood thinners.  HPI     Past Medical History:  Diagnosis Date  . Allergy   . Anemia    when she was young  . Arthritis   . Complication of anesthesia    takes her alittle longer to wake up  . GERD (gastroesophageal reflux disease)   . Irritable bowel   . Lactose intolerance   . Migraine   . PE (pulmonary embolism)    in 1994 meniscus repair.  Saw Dr. Susann Givens  . Pneumonia   . PONV (postoperative nausea and vomiting)   . Pre-diabetes   . Restless legs syndrome   . Sleep apnea    2007.  Uses the mask & machine when she needs it.  Dr. Susann Givens is aware    Patient Active Problem List   Diagnosis Date Noted  . Hyperlipidemia associated with type 2 diabetes mellitus (HCC) 10/06/2019  . Type 2 diabetes mellitus without complications (HCC) 02/21/2018  . Chest pain 10/25/2017  . S/P TKR (total knee replacement), right 07/31/2016  . Status post total left knee replacement 12/14/2014  . Arthritis 07/21/2014  . GERD (gastroesophageal reflux disease) 06/12/2011  . Sleep apnea 06/12/2011  . Obesity (BMI 30-39.9) 06/12/2011  . Allergic rhinitis due to pollen 06/12/2011  . Migraine headache 06/12/2011    Past Surgical History:  Procedure Laterality Date  . ABDOMINAL HYSTERECTOMY  01/2006   FIBROIDS  . CESAREAN SECTION    . HARDWARE REMOVAL Left 08/03/2014   Procedure: HARDWARE REMOVAL;   Surgeon: Velna Ochs, MD;  Location: [facility] OR;  Service: Orthopedics;  Laterality: Left;  . HARDWARE REMOVAL Right 07/31/2016   Procedure: HARDWARE REMOVAL;  Surgeon: Marcene Corning, MD;  Location: [facility] OR;  Service: Orthopedics;  Laterality: Right;  . KNEE SURGERY Bilateral    multiple  . 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 right wrist arthroscopy  2009  . TOTAL KNEE ARTHROPLASTY Left 08/03/2014   dr Jerl Santos  . TOTAL KNEE ARTHROPLASTY Left 08/03/2014   Procedure: LEFT TOTAL KNEE ARTHROPLASTY;  Surgeon: Velna Ochs, MD;  Location: [facility] OR;  Service: Orthopedics;  Laterality: Left;  . TOTAL KNEE ARTHROPLASTY Right 07/31/2016   Procedure: TOTAL KNEE ARTHROPLASTY;  Surgeon: Marcene Corning, MD;  Location: [facility] OR;  Service: Orthopedics;  Laterality: Right;     OB History   No obstetric history on file.     Family History  Problem Relation Age of Onset  . Arthritis Mother   . Diabetes Mother   . Heart disease Mother   . Hypertension Mother   . Kidney disease Mother   . Stroke Mother   . Gallbladder disease Mother   . Glaucoma Mother   . Seizures Mother   . Migraines Mother   . Arthritis Father   . Diabetes Father   .  Hypertension Father   . Kidney disease Father   . Gallbladder disease Father   . Migraines Sister   . Migraines Brother   . Healthy Son     Social History   Tobacco Use  . Smoking status: Never Smoker  . Smokeless tobacco: Never Used  Substance Use Topics  . Alcohol use: Not Currently    Comment: "with a good steak"  . Drug use: No    Home Medications Prior to Admission medications   Medication Sig Start Date End Date Taking? Authorizing Provider  nitrofurantoin, macrocrystal-monohydrate, (MACROBID) 100 MG capsule Take 1 capsule (100 mg total) by mouth 2 (two) times daily for 5 days. 01/17/21 01/22/21 Yes Veatrice Eckstein, Kermit BaloMatthew J, MD  clobetasol (TEMOVATE) 0.05 % external  solution Apply topically. 10/20/20   [provider]  Colloidal Oatmeal (AVEENO ECZEMA THERAPY EX) Apply 1 application topically 2 (two) times daily.    [provider]  dapagliflozin propanediol (FARXIGA) 10 MG TABS tablet Take 1 tablet (10 mg total) by mouth daily before breakfast. 12/05/20   Ronnald NianLalonde, John C, MD  diphenhydrAMINE (BENADRYL) 25 MG tablet Take 25 mg by mouth daily as needed for allergies.    [provider]  Dulaglutide (TRULICITY) 1.5 MG/0.5ML SOPN Inject 1.5 mg into the skin once a week. 12/05/20   Ronnald NianLalonde, John C, MD  esomeprazole (NEXIUM) 20 MG capsule Take 1 capsule (20 mg total) by mouth daily before breakfast. Patient taking differently: Take 20 mg by mouth 2 (two) times daily as needed (for heartburn). 06/10/13   Ronnald NianLalonde, John C, MD  fluticasone furoate-vilanterol (BREO ELLIPTA) 200-25 MCG/INH AEPB Inhale 1 puff into the lungs daily. 05/18/20   Mannam, Colbert CoyerPraveen, MD  glucose blood test strip Use as instructed 01/21/18   Ronnald NianLalonde, John C, MD  lisinopril (PRINIVIL,ZESTRIL) 5 MG tablet Take 1 tablet (5 mg total) by mouth daily. 10/15/18   Ronnald NianLalonde, John C, MD  loratadine (CLARITIN) 10 MG tablet Take 10 mg by mouth daily as needed for allergies.    [provider]  metFORMIN (GLUCOPHAGE) 1000 MG tablet TAKE 1 TABLET BY MOUTH EVERY DAY WITH BREAKFAST 06/27/20   Ronnald NianLalonde, John C, MD  promethazine (PHENERGAN) 12.5 MG tablet Take 1-2 tablets (12.5-25 mg total) by mouth every 6 (six) hours as needed for nausea or vomiting. 08/19/18   Elodia FlorenceNida, Andrew, PA-C  Pseudoephedrine-guaiFENesin (MUCINEX D MAX STRENGTH PO) Take by mouth.    [provider]  Rimegepant Sulfate (NURTEC) 75 MG TBDP Take 1 tablet by mouth daily as needed. 09/05/20   Ronnald NianLalonde, John C, MD  rosuvastatin (CRESTOR) 40 MG tablet Take 1 tablet (40 mg total) by mouth daily. 12/31/17   Ronnald NianLalonde, John C, MD  TURMERIC PO Take 1 tablet by mouth daily.    [provider]  venlafaxine (EFFEXOR) 37.5  MG tablet Take 37.5 mg by mouth at bedtime.  12/16/19   [provider]    Allergies    Mushroom extract complex, Peach flavor, Adhesive [tape], Penicillins, and Latex  Review of Systems   Review of Systems  Constitutional: Negative for chills and fever.  Eyes: Negative for visual disturbance.  Respiratory: Negative for cough and shortness of breath.   Cardiovascular: Negative for chest pain and palpitations.  Gastrointestinal: Negative for nausea and vomiting.  Genitourinary: Positive for dysuria, flank pain and hematuria.  Musculoskeletal: Negative for arthralgias and back pain.  Skin: Negative for color change and rash.  Neurological: Negative for syncope and headaches.  All other systems reviewed and  are negative.   Physical Exam Updated Vital Signs BP 122/87   Pulse 67   Temp 98.1 F (36.7 C) (Oral)   Resp 20   Ht 5\' 6"  (1.676 m)   Wt 90.7 kg   SpO2 100%   BMI 32.28 kg/m   Physical Exam Constitutional:      General: She is not in acute distress. HENT:     Head: Normocephalic and atraumatic.  Eyes:     Conjunctiva/sclera: Conjunctivae normal.     Pupils: Pupils are equal, round, and reactive to light.  Cardiovascular:     Rate and Rhythm: Normal rate and regular rhythm.  Pulmonary:     Effort: Pulmonary effort is normal. No respiratory distress.  Abdominal:     General: There is no distension.     Tenderness: There is abdominal tenderness in the suprapubic area.  Skin:    General: Skin is warm and dry.  Neurological:     General: No focal deficit present.     Mental Status: She is alert. Mental status is at baseline.  Psychiatric:        Mood and Affect: Mood normal.        Behavior: Behavior normal.     ED Results / Procedures / Treatments   Labs (all labs ordered are listed, but only abnormal results are displayed) Labs Reviewed  COMPREHENSIVE METABOLIC PANEL - Abnormal; Notable for the following components:      Result Value   Potassium  5.2 (*)    Glucose, Bld 145 (*)    AST 69 (*)    ALT 64 (*)    All other components within normal limits  CBC - Abnormal; Notable for the following components:   RBC 5.25 (*)    All other components within normal limits  URINALYSIS, ROUTINE W REFLEX MICROSCOPIC - Abnormal; Notable for the following components:   Color, Urine BROWN (*)    APPearance TURBID (*)    Specific Gravity, Urine >1.030 (*)    Hgb urine dipstick LARGE (*)    Bilirubin Urine MODERATE (*)    Protein, ur >300 (*)    Nitrite POSITIVE (*)    Leukocytes,Ua MODERATE (*)    All other components within normal limits  URINALYSIS, MICROSCOPIC (REFLEX) - Abnormal; Notable for the following components:   Bacteria, UA MANY (*)    All other components within normal limits  URINE CULTURE  LIPASE, BLOOD  PREGNANCY, URINE    EKG None  Radiology CT Renal Stone Study  Result Date: 01/17/2021 CLINICAL DATA:  50 year old with hematuria and right flank pain. EXAM: CT ABDOMEN AND PELVIS WITHOUT CONTRAST TECHNIQUE: Multidetector CT imaging of the abdomen and pelvis was performed following the standard protocol without IV contrast. COMPARISON:  01/12/2020 FINDINGS: Lower chest: Lung bases are clear. Hepatobiliary: Diffuse low density throughout the liver is compatible with hepatic steatosis. The entire right hepatic dome is not imaged. Nodular structure along the anterior aspect of the caudate appears to be related to the liver and there was a similar finding dating back to 11/24/2006. Gallbladder is not distended. Pancreas: Unremarkable. No pancreatic ductal dilatation or surrounding inflammatory changes. Spleen: Normal in size without focal abnormality. Adrenals/Urinary Tract: Normal appearance of the adrenal glands. Small amount of fluid in the urinary bladder. Negative for kidney or ureter stones. There is a small calcification in the right hemipelvis on sequence 2, image 74 that is chronic and likely represents a phlebolith rather  than a ureter stone. Additional phleboliths involving  the right ovarian veins. No hydronephrosis and no suspicious renal lesions. Stomach/Bowel: Stomach is within normal limits. No evidence of bowel wall thickening, distention, or inflammatory changes. Incidentally, there is a small calcification or appendicolith associated with the appendix. No appendix inflammation. Vascular/Lymphatic: No significant vascular findings are present. No enlarged abdominal or pelvic lymph nodes. Reproductive: Uterus has been removed. Rounded soft tissue structure along the left side of the pelvis on sequence 2 image 81 measures roughly 2.2 cm and stable from 2021. Left ovary/adnexa is grossly stable. No significant change in the right adnexal region. Other: Negative for ascites.  Periumbilical hernia containing fat. Musculoskeletal: No acute bone abnormality. IMPRESSION: 1. Negative for kidney stones or hydronephrosis. 2. Post hysterectomy. Stable rounded soft tissue along the left posterior aspect of the pelvis. This finding has not significantly changed since 01/12/2020 but may be new since 11/24/2006. Soft tissue in this area is indeterminate and consider non emergent pelvic ultrasound for further characterization. 3. Small appendicolith without appendix inflammation. 4. Hepatic steatosis 5. Periumbilical hernia containing fat. Electronically Signed   By: Richarda Overlie M.D.   On: 01/17/2021 11:26    Procedures Procedures   Medications Ordered in ED Medications  nitrofurantoin (macrocrystal-monohydrate) (MACROBID) capsule 100 mg (100 mg Oral Given 01/17/21 1147)    ED Course  I have reviewed the triage vital signs and the nursing notes.  Pertinent labs & imaging results that were available during my care of the patient were reviewed by me and considered in my medical decision making (see chart for details).  DDx includes cystitis vs kidney stone/ureteral colic vs pyelo vs other  Labs reviewed - UA with leuks, nitrites,  likely UTI.  Urine cx added on.  WBC normal. CMP near baseline.    CT renal study without evidence of stone or stranding about the kidneys.  Doubt pyelo clinically.  Incidental CT findings discussed with patient regarding appendecolith (doubt appendicitis at this time), periumbilical hernia, soft tissue findings, hepatic steatosis.  Can f/u with PCP on this.  PCN allergies - severe - will start on macrobid for 5 days, advised PCP f/u.  Okay for discharge.  Clinical Course as of 01/17/21 1726  Tue Jan 17, 2021  1140 IMPRESSION: 1. Negative for kidney stones or hydronephrosis. 2. Post hysterectomy. Stable rounded soft tissue along the left posterior aspect of the pelvis. This finding has not significantly changed since 01/12/2020 but may be new since 11/24/2006. Soft tissue in this area is indeterminate and consider non emergent pelvic ultrasound for further characterization. 3. Small appendicolith without appendix inflammation. 4. Hepatic steatosis 5. Periumbilical hernia containing fat. [MT]    Clinical Course User Index [MT] Shalva Rozycki, Kermit Balo, MD     Final Clinical Impression(s) / ED Diagnoses Final diagnoses:  Cystitis    Rx / DC Orders ED Discharge Orders         Ordered    nitrofurantoin, macrocrystal-monohydrate, (MACROBID) 100 MG capsule  2 times daily        01/17/21 1150           Terald Sleeper, MD 01/17/21 1726

## 2021-01-18 ENCOUNTER — Telehealth: Payer: Self-pay | Admitting: *Deleted

## 2021-01-18 LAB — URINE CULTURE

## 2021-01-18 NOTE — Telephone Encounter (Signed)
Transition Care Management Unsuccessful Follow-up Telephone Call  Date of discharge and from where:  01/17/2021 - [facility]  Attempts:  1st Attempt  Reason for unsuccessful TCM follow-up call:  Left voice message

## 2021-01-19 NOTE — Telephone Encounter (Signed)
Transition Care Management Unsuccessful Follow-up Telephone Call  Date of discharge and from where:  01/17/2021 - [facility]  Attempts:  2nd Attempt  Reason for unsuccessful TCM follow-up call:  Left voice message

## 2021-01-20 NOTE — Telephone Encounter (Signed)
Transition Care Management Unsuccessful Follow-up Telephone Call  Date of discharge and from where:  01/17/2021 from [facility]  Attempts:  3rd Attempt  Reason for unsuccessful TCM follow-up call:  Unable to reach patient

## 2021-02-02 LAB — HM COLONOSCOPY

## 2021-03-09 ENCOUNTER — Ambulatory Visit: Payer: BC Managed Care – PPO | Admitting: Family Medicine

## 2021-03-09 ENCOUNTER — Other Ambulatory Visit: Payer: Self-pay

## 2021-03-09 VITALS — BP 144/90 | HR 76 | Temp 97.6°F | Wt 201.8 lb

## 2021-03-09 DIAGNOSIS — E785 Hyperlipidemia, unspecified: Secondary | ICD-10-CM | POA: Diagnosis not present

## 2021-03-09 DIAGNOSIS — E1169 Type 2 diabetes mellitus with other specified complication: Secondary | ICD-10-CM

## 2021-03-09 DIAGNOSIS — K219 Gastro-esophageal reflux disease without esophagitis: Secondary | ICD-10-CM

## 2021-03-09 DIAGNOSIS — E119 Type 2 diabetes mellitus without complications: Secondary | ICD-10-CM | POA: Diagnosis not present

## 2021-03-09 MED ORDER — ROSUVASTATIN CALCIUM 40 MG PO TABS: 40.0000 mg | ORAL_TABLET | Freq: Every day | ORAL | 3 refills | Status: DC

## 2021-03-09 NOTE — Progress Notes (Signed)
   Subjective:    Patient ID: Karen Hicks, female    DOB: 09/16/1970, 50 y.o.   MRN: [id number]  HPI She complains of a 3-week history of difficulty with feeling nauseous and slight sluggishness after she takes her medications in the morning.  She takes [place], metformin and lisinopril.  She also has a history of reflux disease but states that the symptoms are not the same symptoms that she is with reflux.  She does use Nexium on an as-needed basis.  She has altered how she takes the Singulair has had very little luck with controlling this.  She also states she is run on Crestor.   Review of Systems     Objective:   Physical Exam Alert and in no distress.  Abdominal exam shows no masses or tenderness.  Cardiac exam shows regular rhythm without murmurs or gallops.       Assessment & Plan:  Type 2 diabetes mellitus without complication, without long-term current use of insulin (HCC)  Hyperlipidemia associated with type 2 diabetes mellitus (HCC) - Plan: rosuvastatin (CRESTOR) 40 MG tablet  Gastroesophageal reflux disease without esophagitis I am unsure as to exactly what is happening.  I will have her take her pills at nighttime and also take Nexium on a regular basis.  She is scheduled back here in a couple weeks.  She was comfortable with that.

## 2021-03-30 ENCOUNTER — Encounter: Payer: BC Managed Care – PPO | Admitting: Family Medicine

## 2021-04-20 ENCOUNTER — Ambulatory Visit (INDEPENDENT_AMBULATORY_CARE_PROVIDER_SITE_OTHER): Payer: BC Managed Care – PPO | Admitting: Family Medicine

## 2021-04-20 ENCOUNTER — Encounter: Payer: Self-pay | Admitting: Family Medicine

## 2021-04-20 ENCOUNTER — Other Ambulatory Visit: Payer: Self-pay

## 2021-04-20 VITALS — BP 120/78 | HR 73 | Temp 97.1°F | Ht 66.0 in | Wt 205.4 lb

## 2021-04-20 DIAGNOSIS — Z96652 Presence of left artificial knee joint: Secondary | ICD-10-CM

## 2021-04-20 DIAGNOSIS — E785 Hyperlipidemia, unspecified: Secondary | ICD-10-CM

## 2021-04-20 DIAGNOSIS — Z96651 Presence of right artificial knee joint: Secondary | ICD-10-CM

## 2021-04-20 DIAGNOSIS — K219 Gastro-esophageal reflux disease without esophagitis: Secondary | ICD-10-CM

## 2021-04-20 DIAGNOSIS — G4730 Sleep apnea, unspecified: Secondary | ICD-10-CM

## 2021-04-20 DIAGNOSIS — J45990 Exercise induced bronchospasm: Secondary | ICD-10-CM | POA: Insufficient documentation

## 2021-04-20 DIAGNOSIS — R232 Flushing: Secondary | ICD-10-CM | POA: Diagnosis not present

## 2021-04-20 DIAGNOSIS — Z23 Encounter for immunization: Secondary | ICD-10-CM | POA: Diagnosis not present

## 2021-04-20 DIAGNOSIS — E119 Type 2 diabetes mellitus without complications: Secondary | ICD-10-CM

## 2021-04-20 DIAGNOSIS — Z0000 Encounter for general adult medical examination without abnormal findings: Secondary | ICD-10-CM

## 2021-04-20 DIAGNOSIS — J301 Allergic rhinitis due to pollen: Secondary | ICD-10-CM | POA: Diagnosis not present

## 2021-04-20 DIAGNOSIS — E669 Obesity, unspecified: Secondary | ICD-10-CM | POA: Diagnosis not present

## 2021-04-20 DIAGNOSIS — G43009 Migraine without aura, not intractable, without status migrainosus: Secondary | ICD-10-CM

## 2021-04-20 DIAGNOSIS — E1169 Type 2 diabetes mellitus with other specified complication: Secondary | ICD-10-CM

## 2021-04-20 LAB — POCT URINALYSIS DIP (PROADVANTAGE DEVICE)
Bilirubin, UA: NEGATIVE
Blood, UA: NEGATIVE
Glucose, UA: NEGATIVE mg/dL
Ketones, POC UA: NEGATIVE mg/dL
Leukocytes, UA: NEGATIVE
Nitrite, UA: NEGATIVE
Protein Ur, POC: NEGATIVE mg/dL
Specific Gravity, Urine: 1.03
Urobilinogen, Ur: 0.2
pH, UA: 6 (ref 5.0–8.0)

## 2021-04-20 LAB — POCT GLYCOSYLATED HEMOGLOBIN (HGB A1C): Hemoglobin A1C: 7 % — AB (ref 4.0–5.6)

## 2021-04-20 MED ORDER — LISINOPRIL 5 MG PO TABS: 5.0000 mg | ORAL_TABLET | Freq: Every day | ORAL | 3 refills | Status: DC

## 2021-04-20 MED ORDER — ALBUTEROL SULFATE HFA 108 (90 BASE) MCG/ACT IN AERS: 2.0000 | INHALATION_SPRAY | Freq: Four times a day (QID) | RESPIRATORY_TRACT | 0 refills | Status: DC | PRN

## 2021-04-20 MED ORDER — METFORMIN HCL 1000 MG PO TABS: ORAL_TABLET | ORAL | 1 refills | Status: DC

## 2021-04-20 NOTE — Progress Notes (Signed)
   Subjective:    Patient ID: Karen Hicks, female    DOB: July 05, 1971, 50 y.o.   MRN: [id number]  HPI She is here for complete examination.  She has had some cramping in her legs that has been intermittent in nature.  Also some right foot pain but no history of injury.  She does have reflux disease and is using Nexium for that.  She has had a colonoscopy.  She did have difficulty with breathing in the past and was seen by pulmonary.  They did give her Virgel Bouquet however she is using it on an as-needed basis mainly for exercise.  She has a previous history of EIA.  Her allergies are under good control.  Her migraines seem to be under good control with Nurtec.  She is using the more often recently due to stress from work.  She is using Effexor given to her by her gynecologist for hot flashes.  She does have diabetes and is using [place], Trulicity and metformin.  Continues on Crestor.  She is on low-dose lisinopril.  She does have a history of OSA but presently is on no CPAP apparently she does not need it because her AHI is lower than needed for CPAP.  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  Her work seems are going well.  Otherwise her family and social history as well as health maintenance and immunizations was reviewed   Review of Systems  All other systems reviewed and are negative.     Objective:   Physical Exam Alert and in no distress. Tympanic membranes and canals are normal. Pharyngeal area is normal. Neck is supple without adenopathy or thyromegaly. Cardiac exam shows a regular sinus rhythm without murmurs or gallops. Lungs are clear to auscultation.  Lower extremity exam shows no palpable tenderness or edema.  Negative Homans' sign. Hemoglobin A1c is 7.0       Assessment & Plan:  Routine general medical examination at a health care facility  Gastroesophageal reflux disease without esophagitis  Sleep apnea, unspecified type  Obesity (BMI 30-39.9)  Allergic  rhinitis due to pollen, unspecified seasonality  Migraine without aura and without status migrainosus, not intractable  Status post total left knee replacement  S/P TKR (total knee replacement), right  Type 2 diabetes mellitus without complication, without long-term current use of insulin (HCC) - Plan: HgB A1c, Comprehensive metabolic panel, POCT Urinalysis DIP (Proadvantage Device), metFORMIN (GLUCOPHAGE) 1000 MG tablet  Hyperlipidemia associated with type 2 diabetes mellitus (HCC)  Hot flashes  Need for vaccination against Streptococcus pneumoniae - Plan: Pneumococcal conjugate vaccine 20-valent (Prevnar 20)  Exercise-induced asthma - Plan: albuterol (VENTOLIN HFA) 108 (90 Base) MCG/ACT inhaler Continue on present medication regimen.  Recommend she stop Breo and we will give her Ventolin to help with her exercise-induced asthma.  Continue on her other medications.  Did recommend arch supports for her feet.  Recommend stretching and adequate hydration to help with her cramping. Continue on Effexor for her hot flashes.  Continue on Nurtec on an as-needed basis.

## 2021-04-21 LAB — COMPREHENSIVE METABOLIC PANEL
ALT: 36 IU/L — ABNORMAL HIGH (ref 0–32)
AST: 38 IU/L (ref 0–40)
Albumin/Globulin Ratio: 1.8 (ref 1.2–2.2)
Albumin: 4.6 g/dL (ref 3.8–4.8)
Alkaline Phosphatase: 92 IU/L (ref 44–121)
BUN/Creatinine Ratio: 14 (ref 9–23)
BUN: 10 mg/dL (ref 6–24)
Bilirubin Total: 0.5 mg/dL (ref 0.0–1.2)
CO2: 23 mmol/L (ref 20–29)
Calcium: 9.5 mg/dL (ref 8.7–10.2)
Chloride: 104 mmol/L (ref 96–106)
Creatinine, Ser: 0.73 mg/dL (ref 0.57–1.00)
Globulin, Total: 2.6 g/dL (ref 1.5–4.5)
Glucose: 93 mg/dL (ref 65–99)
Potassium: 4.8 mmol/L (ref 3.5–5.2)
Sodium: 142 mmol/L (ref 134–144)
Total Protein: 7.2 g/dL (ref 6.0–8.5)
eGFR: 101 mL/min/{1.73_m2} (ref >59)

## 2021-04-23 ENCOUNTER — Emergency Department (HOSPITAL_BASED_OUTPATIENT_CLINIC_OR_DEPARTMENT_OTHER)
Admission: EM | Admit: 2021-04-23 | Discharge: 2021-04-23 | Disposition: A | Discharge status: Home / Self Care | Payer: BC Managed Care – PPO | Attending: Emergency Medicine | Admitting: Emergency Medicine

## 2021-04-23 ENCOUNTER — Encounter (HOSPITAL_BASED_OUTPATIENT_CLINIC_OR_DEPARTMENT_OTHER): Payer: Self-pay | Admitting: Obstetrics and Gynecology

## 2021-04-23 ENCOUNTER — Other Ambulatory Visit: Payer: Self-pay

## 2021-04-23 DIAGNOSIS — E1169 Type 2 diabetes mellitus with other specified complication: Secondary | ICD-10-CM | POA: Insufficient documentation

## 2021-04-23 DIAGNOSIS — Z96653 Presence of artificial knee joint, bilateral: Secondary | ICD-10-CM | POA: Insufficient documentation

## 2021-04-23 DIAGNOSIS — R200 Anesthesia of skin: Secondary | ICD-10-CM | POA: Diagnosis not present

## 2021-04-23 DIAGNOSIS — R2232 Localized swelling, mass and lump, left upper limb: Secondary | ICD-10-CM | POA: Insufficient documentation

## 2021-04-23 DIAGNOSIS — Z794 Long term (current) use of insulin: Secondary | ICD-10-CM | POA: Diagnosis not present

## 2021-04-23 DIAGNOSIS — Z91040 Latex allergy status: Secondary | ICD-10-CM | POA: Insufficient documentation

## 2021-04-23 DIAGNOSIS — R21 Rash and other nonspecific skin eruption: Secondary | ICD-10-CM | POA: Insufficient documentation

## 2021-04-23 DIAGNOSIS — T8062XA Other serum reaction due to vaccination, initial encounter: Secondary | ICD-10-CM | POA: Diagnosis not present

## 2021-04-23 DIAGNOSIS — E785 Hyperlipidemia, unspecified: Secondary | ICD-10-CM | POA: Diagnosis not present

## 2021-04-23 DIAGNOSIS — T881XXA Other complications following immunization, not elsewhere classified, initial encounter: Secondary | ICD-10-CM

## 2021-04-23 MED ORDER — NAPROXEN 500 MG PO TABS
500.0000 mg | ORAL_TABLET | Freq: Two times a day (BID) | ORAL | 0 refills | Status: DC
Start: 2021-04-23 — End: 2022-08-13

## 2021-04-23 NOTE — ED Triage Notes (Signed)
Patient reports to the ER for injection site reaction suspicious for cellulitis. Patient has redness and swelling to the left deltoid and obvious erythema. Patient received pneumonia shot on Thursday at PCP office.

## 2021-04-23 NOTE — ED Provider Notes (Signed)
[facility] EMERGENCY DEPT Provider Note   CSN: [id number] Arrival date & time: 04/23/21  1449     History Chief Complaint  Patient presents with   Rash    Karen Hicks is a 50 y.o. female.  Patient got pneumonia vaccine on August 18 in her primary care doctor's office Dr. Susann Givens, patient with complaint of pain swelling there was redness that is resolved at the site of the injection.  Has some numbness radiating down to the hand.  Also has a complaint of joint aches all over.  But no fever.      Past Medical History:  Diagnosis Date   Allergy    Anemia    when she was young   Arthritis    Complication of anesthesia    takes her alittle longer to wake up   GERD (gastroesophageal reflux disease)    Irritable bowel    Lactose intolerance    Migraine    PE (pulmonary embolism)    in 1994 meniscus repair.  Saw Dr. Susann Givens   Pneumonia    PONV (postoperative nausea and vomiting)    Pre-diabetes    Restless legs syndrome    Sleep apnea    2007.  Uses the mask & machine when she needs it.  Dr. Susann Givens is aware    Patient Active Problem List   Diagnosis Date Noted   Hot flashes 04/20/2021   Exercise-induced asthma 04/20/2021   Hyperlipidemia associated with type 2 diabetes mellitus (HCC) 10/06/2019   Type 2 diabetes mellitus without complications (HCC) 02/21/2018   Chest pain 10/25/2017   S/P TKR (total knee replacement), right 07/31/2016   Status post total left knee replacement 12/14/2014   Arthritis 07/21/2014   GERD (gastroesophageal reflux disease) 06/12/2011   Sleep apnea 06/12/2011   Obesity (BMI 30-39.9) 06/12/2011   Allergic rhinitis due to pollen 06/12/2011   Migraine headache 06/12/2011    Past Surgical History:  Procedure Laterality Date   ABDOMINAL HYSTERECTOMY  01/2006   FIBROIDS   CESAREAN SECTION     HARDWARE REMOVAL Left 08/03/2014   Procedure: HARDWARE REMOVAL;  Surgeon: Velna Ochs, MD;  Location: [facility] OR;  Service:  Orthopedics;  Laterality: Left;   HARDWARE REMOVAL Right 07/31/2016   Procedure: HARDWARE REMOVAL;  Surgeon: Marcene Corning, MD;  Location: [facility] OR;  Service: Orthopedics;  Laterality: Right;   KNEE SURGERY Bilateral    multiple   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     Family History  Problem Relation Age of Onset   Arthritis Mother    Diabetes Mother    Heart disease Mother    Hypertension Mother    Kidney disease Mother    Stroke Mother    Gallbladder disease Mother    Glaucoma Mother    Seizures Mother    Migraines Mother    Arthritis Father    Diabetes Father  Hypertension Father    Kidney disease Father    Gallbladder disease Father    Migraines Sister    Migraines Brother    Healthy Son     Social History   Tobacco Use   Smoking status: Never   Smokeless tobacco: Never  Substance Use Topics   Alcohol use: Not Currently    Comment: "with a good steak"   Drug use: No    Home Medications Prior to Admission medications   Medication Sig Start Date End Date Taking? Authorizing Provider  naproxen (NAPROSYN) 500 MG tablet Take 1 tablet (500 mg total) by mouth 2 (two) times daily. 04/23/21  Yes Vanetta Mulders, MD  albuterol (VENTOLIN HFA) 108 (90 Base) MCG/ACT inhaler Inhale 2 puffs into the lungs every 6 (six) hours as needed for wheezing or shortness of breath. 04/20/21   Ronnald Nian, MD  clobetasol  (TEMOVATE) 0.05 % external solution Apply topically. 10/20/20   [provider]  Colloidal Oatmeal (AVEENO ECZEMA THERAPY EX) Apply 1 application topically 2 (two) times daily.    [provider]  dapagliflozin propanediol (FARXIGA) 10 MG TABS tablet Take 1 tablet (10 mg total) by mouth daily before breakfast. 12/05/20   Ronnald Nian, MD  diphenhydrAMINE (BENADRYL) 25 MG tablet Take 25 mg by mouth daily as needed for allergies.    [provider]  Dulaglutide (TRULICITY) 1.5 MG/0.5ML SOPN Inject 1.5 mg into the skin once a week. 12/05/20   Ronnald Nian, MD  esomeprazole (NEXIUM) 20 MG capsule Take 1 capsule (20 mg total) by mouth daily before breakfast. Patient taking differently: Take 20 mg by mouth 2 (two) times daily as needed (for heartburn). 06/10/13   Ronnald Nian, MD  glucose blood test strip Use as instructed 01/21/18   Ronnald Nian, MD  lisinopril (ZESTRIL) 5 MG tablet Take 1 tablet (5 mg total) by mouth daily. 04/20/21   Ronnald Nian, MD  loratadine (CLARITIN) 10 MG tablet Take 10 mg by mouth daily as needed for allergies.    [provider]  metFORMIN (GLUCOPHAGE) 1000 MG tablet TAKE 1 TABLET BY MOUTH EVERY DAY WITH BREAKFAST 04/20/21   Ronnald Nian, MD  Minoxidil (ROGAINE MENS EXTRA STRENGTH) 5 % FOAM See admin instructions. 10/20/20   [provider]  promethazine (PHENERGAN) 12.5 MG tablet Take 1-2 tablets (12.5-25 mg total) by mouth every 6 (six) hours as needed for nausea or vomiting. Patient not taking: Reported on 04/20/2021 08/19/18   Elodia Florence, PA-C  Pseudoephedrine-guaiFENesin [facility] D MAX STRENGTH PO) Take by mouth. Patient not taking: No sig reported    [provider]  Rimegepant Sulfate (NURTEC) 75 MG TBDP Take 1 tablet by mouth daily as needed. 09/05/20   Ronnald Nian, MD  rosuvastatin (CRESTOR) 40 MG tablet Take 1 tablet (40 mg total) by mouth daily. 03/09/21   Ronnald Nian, MD  selenium sulfide (SELSUN  BLUE) 1 % LOTN 1 application to wet scalp 10/20/20   [provider]  TURMERIC PO Take 1 tablet by mouth daily.    [provider]  venlafaxine (EFFEXOR) 37.5 MG tablet Take 37.5 mg by mouth at bedtime.  12/16/19   [provider]    Allergies    Mushroom extract complex, Peach flavor, Adhesive [tape], Penicillins, and Latex  Review of Systems   Review of Systems  Constitutional:  Negative for chills and fever.  HENT:  Negative for ear pain and sore throat.   Eyes:  Negative for pain and visual disturbance.  Respiratory:  Negative for cough and shortness of breath.   Cardiovascular:  Negative for chest pain and palpitations.  Gastrointestinal:  Negative for abdominal pain and vomiting.  Genitourinary:  Negative for dysuria and hematuria.  Musculoskeletal:  Positive for arthralgias. Negative for back pain, joint swelling and myalgias.  Skin:  Negative for color change and rash.  Neurological:  Negative for seizures and syncope.  All other systems reviewed and are negative.  Physical Exam Updated Vital Signs BP 138/86 (BP Location: Right Arm)   Pulse 84   Temp 98.5 F (36.9 C)   Resp 16   Ht 1.676 m (5\' 6" )   Wt 93.2 kg   SpO2 100%   BMI 33.15 kg/m   Physical Exam Vitals and nursing note reviewed.  Constitutional:      General: She is not in acute distress.    Appearance: Normal appearance. She is well-developed.  HENT:     Head: Normocephalic and atraumatic.  Eyes:     Extraocular Movements: Extraocular movements intact.     Conjunctiva/sclera: Conjunctivae normal.     Pupils: Pupils are equal, round, and reactive to light.  Cardiovascular:     Rate and Rhythm: Normal rate and regular rhythm.     Heart sounds: No murmur heard. Pulmonary:     Effort: Pulmonary effort is normal. No respiratory distress.     Breath sounds: Normal breath sounds.  Abdominal:     Palpations: Abdomen is soft.     Tenderness: There is no abdominal tenderness.   Musculoskeletal:        General: Swelling and tenderness present. Normal range of motion.     Cervical back: Normal range of motion and neck supple.     Comments: Left upper extremity.  In the arm area an area of induration measuring about 5 cm.  No erythema.  No fluctuance.  Distally good movement at the elbow good movement at the wrist and fingers.  Radial pulses 2+.  Sensation grossly intact.  Patient talks about tingling and some shooting numbness.  Good strength in the hand.  No joint swelling.  In any of the extremities.  Skin:    General: Skin is warm and dry.     Capillary Refill: Capillary refill takes less than 2 seconds.  Neurological:     General: No focal deficit present.     Mental Status: She is alert and oriented to person, place, and time.     Cranial Nerves: No cranial nerve deficit.     Sensory: No sensory deficit.     Motor: No weakness.    ED Results / Procedures / Treatments   Labs (all labs ordered are listed, but only abnormal results are displayed) Labs Reviewed - No data to display  EKG None  Radiology No results found.  Procedures Procedures   Medications Ordered in ED Medications - No data to display  ED Course  I have reviewed the triage vital signs and the nursing notes.  Pertinent labs & imaging results that were available during my care of the patient were reviewed by me and considered in my medical decision making (see chart for details).    MDM Rules/Calculators/A&P                           Symptoms consistent with a local reaction to immunization.  No evidence of any infection think this is all inflammatory.  Patient's  labs from August 18 show normal renal function.  So we will treat with a short course 7-day course of Naprosyn.  Have patient follow-up with her primary care doctor for recheck.  Have her return for any new or worse symptoms.   Final Clinical Impression(s) / ED Diagnoses Final diagnoses:  Local reaction to  immunization, initial encounter    Rx / DC Orders ED Discharge Orders          Ordered    naproxen (NAPROSYN) 500 MG tablet  2 times daily        04/23/21 1817             Vanetta MuldersZackowski, Torrin Crihfield, MD 04/23/21 [phone]

## 2021-04-23 NOTE — Discharge Instructions (Addendum)
Symptoms seem to be consistent to a local reaction to immunization.  No evidence at this time of any infection I think this is all inflammatory.  Take the Naprosyn as directed for 7 days.  Make an appointment to follow-up with your primary care doctor for recheck.  Return for any new or worse symptoms.

## 2021-04-24 ENCOUNTER — Telehealth: Payer: Self-pay

## 2021-04-24 NOTE — Telephone Encounter (Signed)
Transition Care Management Follow-up Telephone Call Date of discharge and from where: 04/23/2021-[facility] How have you been since you were released from the hospital? Patient stated she is doing ok.  Any questions or concerns? No  Items Reviewed: Did the pt receive and understand the discharge instructions provided? Yes  Medications obtained and verified? Yes  Other? No  Any new allergies since your discharge? No  Dietary orders reviewed? N/A Do you have support at home? Yes   Home Care and Equipment/Supplies: Were home health services ordered? not applicable If so, what is the name of the agency? N/A  Has the agency set up a time to come to the patient's home? not applicable Were any new equipment or medical supplies ordered?  No What is the name of the medical supply agency? N/A Were you able to get the supplies/equipment? not applicable Do you have any questions related to the use of the equipment or supplies? No  Functional Questionnaire: (I = Independent and D = Dependent) ADLs: I  Bathing/Dressing- I  Meal Prep- I  Eating- I  Maintaining continence- I  Transferring/Ambulation- I  Managing Meds- I  Follow up appointments reviewed:  PCP Hospital f/u appt confirmed? No  Patient stated she will call to schedule follow up with PCP. Specialist Hospital f/u appt confirmed? No   Are transportation arrangements needed? No  If their condition worsens, is the pt aware to call PCP or go to the Emergency Dept.? Yes Was the patient provided with contact information for the PCP's office or ED? Yes Was to pt encouraged to call back with questions or concerns? Yes

## 2021-05-05 ENCOUNTER — Encounter: Payer: Self-pay | Admitting: Family Medicine

## 2021-07-14 ENCOUNTER — Ambulatory Visit: Payer: BC Managed Care – PPO | Admitting: Family Medicine

## 2021-07-14 NOTE — Progress Notes (Deleted)
  Subjective:    Patient ID: Karen Hicks, female    DOB: [birth year], 50 y.o.   MRN: [id number]  Karen Hicks is a 50 y.o. female who presents for follow-up of Type 2 diabetes mellitus.  Home blood sugar records: {diabetes glucometry results:16657} Current symptoms/problems include {Symptoms; diabetes:14075} and have been {Desc; course:15616}. Daily foot checks:   Any foot concerns: *** Exercise: {types:19826} Diet: The following portions of the patient's history were reviewed and updated as appropriate: allergies, current medications, past medical history, past social history and problem list.  ROS as in subjective above.     Objective:    Physical Exam Alert and in no distress otherwise not examined.  There were no vitals taken for this visit.  Lab Review Diabetic Labs Latest Ref Rng & Units 04/20/2021 01/17/2021 12/05/2020 09/05/2020 05/30/2020  HbA1c 4.0 - 5.6 % 7.0(A) - 8.2(A) 7.3(A) 8.1(A)  Microalbumin mg/L - - - - -  Micro/Creat Ratio - - - - - -  Chol 100 - 199 mg/dL - - - - -  HDL >63 mg/dL - - - - -  Calc LDL 0 - 99 mg/dL - - - - -  Triglycerides 0 - 149 mg/dL - - - - -  Creatinine 1.49 - 1.00 mg/dL 7.02 6.37 - - -   BP/Weight 04/23/2021 04/20/2021 03/09/2021 01/17/2021 12/05/2020  Systolic BP 138 120 144 122 128  Diastolic BP 86 78 90 87 80  Wt. (Lbs) 205.4 205.4 201.8 200 203.2  BMI 33.15 33.15 32.57 32.28 33.81   Foot/eye exam completion dates Latest Ref Rng & Units 04/20/2021 09/03/2020  Eye Exam No Retinopathy - No Retinopathy  Foot Form Completion - Done -    Karen Hicks  reports that she has never smoked. She has never used smokeless tobacco. She reports that she does not currently use alcohol. She reports that she does not use drugs.     Assessment & Plan:    No diagnosis found.  Rx changes: {none:33079} Education: Reviewed 'ABCs' of diabetes management (respective goals in parentheses):  A1C (<7), blood pressure (<130/80), and cholesterol (LDL  <100). Compliance at present is estimated to be {good/fair/poor:33178}. Efforts to improve compliance (if necessary) will be directed at {compliance:16716}. Follow up: {NUMBERS; 0-10:33138} {time:11}

## 2021-08-12 ENCOUNTER — Emergency Department (HOSPITAL_COMMUNITY)
Admission: EM | Admit: 2021-08-12 | Discharge: 2021-08-12 | Disposition: A | Discharge status: Home / Self Care | Payer: BC Managed Care – PPO | Attending: Emergency Medicine | Admitting: Emergency Medicine

## 2021-08-12 ENCOUNTER — Encounter (HOSPITAL_COMMUNITY): Payer: Self-pay | Admitting: Emergency Medicine

## 2021-08-12 ENCOUNTER — Emergency Department (HOSPITAL_COMMUNITY): Payer: BC Managed Care – PPO

## 2021-08-12 ENCOUNTER — Other Ambulatory Visit: Payer: Self-pay

## 2021-08-12 DIAGNOSIS — K5900 Constipation, unspecified: Secondary | ICD-10-CM | POA: Diagnosis not present

## 2021-08-12 DIAGNOSIS — R110 Nausea: Secondary | ICD-10-CM | POA: Diagnosis not present

## 2021-08-12 DIAGNOSIS — Z5321 Procedure and treatment not carried out due to patient leaving prior to being seen by health care provider: Secondary | ICD-10-CM | POA: Insufficient documentation

## 2021-08-12 DIAGNOSIS — R109 Unspecified abdominal pain: Secondary | ICD-10-CM | POA: Diagnosis present

## 2021-08-12 LAB — BASIC METABOLIC PANEL
Anion gap: 7 (ref 5–15)
BUN: 10 mg/dL (ref 6–20)
CO2: 26 mmol/L (ref 22–32)
Calcium: 8.4 mg/dL — ABNORMAL LOW (ref 8.9–10.3)
Chloride: 101 mmol/L (ref 98–111)
Creatinine, Ser: 0.81 mg/dL (ref 0.44–1.00)
GFR, Estimated: >60 mL/min (ref >60)
Glucose, Bld: 158 mg/dL — ABNORMAL HIGH (ref 70–99)
Potassium: 3.7 mmol/L (ref 3.5–5.1)
Sodium: 134 mmol/L — ABNORMAL LOW (ref 135–145)

## 2021-08-12 LAB — CBC
HCT: 40.7 % (ref 36.0–46.0)
Hemoglobin: 12.9 g/dL (ref 12.0–15.0)
MCH: 27.4 pg (ref 26.0–34.0)
MCHC: 31.7 g/dL (ref 30.0–36.0)
MCV: 86.6 fL (ref 80.0–100.0)
Platelets: 266 10*3/uL (ref 150–400)
RBC: 4.7 MIL/uL (ref 3.87–5.11)
RDW: 13.5 % (ref 11.5–15.5)
WBC: 8.5 10*3/uL (ref 4.0–10.5)
nRBC: 0 % (ref 0.0–0.2)

## 2021-08-12 LAB — LACTIC ACID, PLASMA: Lactic Acid, Venous: 1.5 mmol/L (ref 0.5–1.9)

## 2021-08-12 MED ORDER — ONDANSETRON 4 MG PO TBDP: 4.0000 mg | ORAL_TABLET | Freq: Once | ORAL | Status: DC

## 2021-08-12 MED ORDER — HYDROMORPHONE HCL 1 MG/ML IJ SOLN: 0.5000 mg | Freq: Once | INTRAMUSCULAR | Status: DC

## 2021-08-12 NOTE — ED Notes (Signed)
Called pt x3 for vitals, no response. Did not see pt come back from going outside.

## 2021-08-12 NOTE — ED Triage Notes (Signed)
Pt had hernia sx this week and has not had a bowel movement since then. Has been taking miralax at home and also drank magnesium citrate at 1 a.m. without relief.  Was advised to come to the ED by her surgeon.

## 2021-08-12 NOTE — ED Notes (Signed)
This NT witness pt being wheeled outside by family member and walking towards the parking lot.

## 2021-08-12 NOTE — ED Provider Notes (Signed)
Emergency Medicine Provider Triage Evaluation Note  Karen Hicks , a 50 y.o. female  was evaluated in triage.  Pt complains of abdominal pain. She is 3 days s/p umbilical hernia repair by Dr. Derrell Lolling. Has been using oxycodone at home for pain. Notes increased nausea with constipation. Passing small amounts of flatus, no BMs despite Mag Citrate and Miralax. Denies fevers, drainage from incision site.  Review of Systems  Positive: As above Negative: As above  Physical Exam  BP (!) 148/72 (BP Location: Left Arm)   Pulse 77   Temp 98.5 F (36.9 C) (Oral)   Resp 18   SpO2 97%  Gen:   Awake, no distress   Resp:  Normal effort  MSK:   Moves extremities without difficulty  Other:  Umbilical incision site is C/D/I. Diffuse abdominal TTP with distension.  Medical Decision Making  Medically screening exam initiated at 4:12 AM.  Appropriate orders placed.  Karen Hicks was informed that the remainder of the evaluation will be completed by another provider, this initial triage assessment does not replace that evaluation, and the importance of remaining in the ED until their evaluation is complete.  Postoperative constipation, abdominal pain - pending labs and Xray to r/o complication, obstruction.   Antony Madura, PA-C 08/12/21 3267    Jacalyn Lefevre, MD 08/12/21 [phone]

## 2021-08-14 ENCOUNTER — Telehealth: Payer: Self-pay

## 2021-08-14 NOTE — Telephone Encounter (Signed)
Transition Care Management Follow-up Telephone Call Date of discharge and from where: 08/12/2021-[place] [facility] How have you been since you were released from the hospital? ELOEPD-patient was feeling sob and she called the oc call physician for the surgical unit whom advised her further.  Any questions or concerns? No

## 2021-08-18 ENCOUNTER — Telehealth (INDEPENDENT_AMBULATORY_CARE_PROVIDER_SITE_OTHER): Payer: BC Managed Care – PPO | Admitting: Family Medicine

## 2021-08-18 ENCOUNTER — Other Ambulatory Visit: Payer: Self-pay

## 2021-08-18 VITALS — Temp 99.0°F | Wt 205.0 lb

## 2021-08-18 DIAGNOSIS — J019 Acute sinusitis, unspecified: Secondary | ICD-10-CM

## 2021-08-18 MED ORDER — AZITHROMYCIN 500 MG PO TABS: 500.0000 mg | ORAL_TABLET | Freq: Every day | ORAL | 0 refills | Status: DC

## 2021-08-18 NOTE — Progress Notes (Signed)
° °  Subjective:    Patient ID: Karen Hicks, female    DOB: 1970-12-01, 50 y.o.   MRN: [id number]  HPI Documentation for virtual audio and video telecommunications through Iantha encounter: The patient was located at home. 2 patient identifiers used.  The provider was located in the office. The patient did consent to this visit and is aware of possible charges through their insurance for this visit. The other persons participating in this telemedicine service were none. Time spent on call was 5 minutes and in review of previous records >15 minutes total for counseling and coordination of care. This virtual service is not related to other E/M service within previous 7 days.  She states that last Monday she developed myalgias, slight cough, sinus congestion, sore throat, fever with fatigue.  The symptoms continued until today and then she developed sinus pressure, purulent postnasal drainage, worsening of her sore throat worsening malaise and fatigue.  Review of Systems     Objective:   Physical Exam Alert and slightly toxic appearing.       Assessment & Plan:  Acute sinusitis, recurrence not specified, unspecified location - Plan: azithromycin (ZITHROMAX) 500 MG tablet I explained that I thought she initially had influenza with the symptoms that she described and then became secondarily infected in her sinuses.  I will call in azithromycin.  Recommend Tylenol, Advil and Afrin at night.  If she still having symptoms at the end of next week she will call for refill on her azithromycin.  She was comfortable with that.

## 2021-10-06 ENCOUNTER — Telehealth: Payer: Self-pay | Admitting: Family Medicine

## 2021-10-06 NOTE — Telephone Encounter (Signed)
Pt faxed FMLA papers , sent back in folder

## 2021-10-10 ENCOUNTER — Telehealth: Payer: BC Managed Care – PPO | Admitting: Family Medicine

## 2021-10-10 ENCOUNTER — Other Ambulatory Visit: Payer: Self-pay

## 2021-10-10 ENCOUNTER — Encounter: Payer: Self-pay | Admitting: Family Medicine

## 2021-10-10 VITALS — Temp 98.9°F | Wt 205.0 lb

## 2021-10-10 DIAGNOSIS — E119 Type 2 diabetes mellitus without complications: Secondary | ICD-10-CM | POA: Diagnosis not present

## 2021-10-10 DIAGNOSIS — Z566 Other physical and mental strain related to work: Secondary | ICD-10-CM | POA: Diagnosis not present

## 2021-10-10 NOTE — Progress Notes (Signed)
° °  Subjective:    Patient ID: Karen Hicks, female    DOB: September 05, 1970, 51 y.o.   MRN: [id number]  HPI Documentation for virtual audio and video telecommunications through Ione encounter: The patient was located at home. 2 patient identifiers used.  The provider was located in the office. The patient did consent to this visit and is aware of possible charges through their insurance for this visit. The other persons participating in this telemedicine service were none. Time spent on call was 10 minutes and in review of previous records >25 minutes total for counseling and coordination of care. This virtual service is not related to other E/M service within previous 7 days.  FMLA papers were sent to my office and I called her to discuss what was going on.  She says that she has been under a lot of stress trying to work and going to school at the same time.  She says that she is required to go to school because of requirements from her work.  Apparently they are short staffed.  She is having difficulty with being stressed, fatigue, headaches, blood pressure fluctuations.  She was told by HR to see if she can get an FMLA.  I explained to her that there is no serious medical condition causing her problem but it is a work-related issue causing her to be fatigued, blood pressure fluctuations and headaches.  Review of Systems     Objective:   Physical Exam Alert and in no distress otherwise not examined       Assessment & Plan:  Work stress  Type 2 diabetes mellitus without complication, without long-term current use of insulin ([place]) I strongly encouraged her to keep track of all communication that she is having with her superiors and the fact that they said to try and get an FMLA which I stated would not be possible under the present circumstances.  Encouraged her to file a grievance since they are putting her in a no win situation and also get involved in [employer].  I will set her up  to come back for a diabetes recheck.

## 2021-10-24 ENCOUNTER — Telehealth: Payer: Self-pay | Admitting: Family Medicine

## 2021-10-24 DIAGNOSIS — G43009 Migraine without aura, not intractable, without status migrainosus: Secondary | ICD-10-CM

## 2021-10-24 MED ORDER — NURTEC 75 MG PO TBDP: 1.0000 | ORAL_TABLET | Freq: Every day | ORAL | 5 refills | Status: DC | PRN

## 2021-10-24 NOTE — Telephone Encounter (Signed)
[employer] [facility] sent refill request for nurtex please send to the [employer] center [place] [id number]

## 2021-11-23 ENCOUNTER — Encounter: Payer: Self-pay | Admitting: Physician Assistant

## 2021-11-23 ENCOUNTER — Other Ambulatory Visit: Payer: Self-pay

## 2021-11-23 ENCOUNTER — Ambulatory Visit: Payer: BC Managed Care – PPO | Admitting: Physician Assistant

## 2021-11-23 ENCOUNTER — Encounter: Payer: Self-pay | Admitting: Neurology

## 2021-11-23 VITALS — BP 140/80 | HR 76 | Ht 66.0 in | Wt 204.0 lb

## 2021-11-23 DIAGNOSIS — G43709 Chronic migraine without aura, not intractable, without status migrainosus: Secondary | ICD-10-CM | POA: Diagnosis not present

## 2021-11-23 DIAGNOSIS — R112 Nausea with vomiting, unspecified: Secondary | ICD-10-CM | POA: Diagnosis not present

## 2021-11-23 MED ORDER — KETOROLAC TROMETHAMINE 60 MG/2ML IM SOLN: 60.0000 mg | Freq: Once | INTRAMUSCULAR | Status: DC

## 2021-11-23 MED ORDER — KETOROLAC TROMETHAMINE 60 MG/2ML IM SOLN
60.0000 mg | Freq: Once | INTRAMUSCULAR | Status: AC
  Administered 2021-11-23: 60 mg via INTRAMUSCULAR

## 2021-11-23 MED ORDER — ONDANSETRON HCL 4 MG PO TABS: 4.0000 mg | ORAL_TABLET | Freq: Three times a day (TID) | ORAL | 0 refills | Status: AC | PRN

## 2021-11-23 MED ORDER — METHYLPREDNISOLONE ACETATE 80 MG/ML IJ SUSP
80.0000 mg | Freq: Once | INTRAMUSCULAR | Status: AC
  Administered 2021-11-23: 80 mg via INTRAMUSCULAR

## 2021-11-23 MED ORDER — METHYLPREDNISOLONE ACETATE 80 MG/ML IJ SUSP: 80.0000 mg | Freq: Once | INTRAMUSCULAR | Status: DC

## 2021-11-23 NOTE — Progress Notes (Signed)
? ?Acute Office Visit ? ?Subjective:  ? ? Patient ID: Karen Hicks, female    DOB: 06-09-1971, 51 y.o.   MRN: [id number] ? ? ?Chief Complaint  ?Patient presents with  ? Migraine  ?  Pt is having migraines more frequently now that cause nausea and vomitting. Pt has a stabbing pain over her right eye.  ? ? ?HPI ?Patient is in today for a follow up appointment.  ?Reports a long history of migraine headaches with aura that decreased in frequency for a while, but now reports having them more frequently; today she reports headache without an aura, but with nausea and vomiting x 2,and pain over her right eye and photophobia. ? ?Outpatient Medications Prior to Visit  ?Medication Sig Dispense Refill  ? albuterol (VENTOLIN HFA) 108 (90 Base) MCG/ACT inhaler Inhale 2 puffs into the lungs every 6 (six) hours as needed for wheezing or shortness of breath. 8 g 0  ? clobetasol (TEMOVATE) 0.05 % external solution Apply topically.    ? Colloidal Oatmeal (AVEENO ECZEMA THERAPY EX) Apply 1 application topically 2 (two) times daily.    ? dapagliflozin propanediol (FARXIGA) 10 MG TABS tablet Take 1 tablet (10 mg total) by mouth daily before breakfast. 90 tablet 1  ? diphenhydrAMINE (BENADRYL) 25 MG tablet Take 25 mg by mouth daily as needed for allergies.    ? Dulaglutide (TRULICITY) 1.5 MG/0.5ML SOPN Inject 1.5 mg into the skin once a week. 6 mL 5  ? esomeprazole (NEXIUM) 20 MG capsule Take 1 capsule (20 mg total) by mouth daily before breakfast. (Patient taking differently: Take 20 mg by mouth 2 (two) times daily as needed (for heartburn).) 30 capsule 11  ? glucose blood test strip Use as instructed 100 each 12  ? lisinopril (ZESTRIL) 5 MG tablet Take 1 tablet (5 mg total) by mouth daily. 90 tablet 3  ? loratadine (CLARITIN) 10 MG tablet Take 10 mg by mouth daily as needed for allergies.    ? metFORMIN (GLUCOPHAGE) 1000 MG tablet TAKE 1 TABLET BY MOUTH EVERY DAY WITH BREAKFAST 90 tablet 1  ? Minoxidil (ROGAINE MENS EXTRA  STRENGTH) 5 % FOAM See admin instructions.    ? naproxen (NAPROSYN) 500 MG tablet Take 1 tablet (500 mg total) by mouth 2 (two) times daily. 14 tablet 0  ? nitroGLYCERIN (NITRODUR - DOSED IN MG/24 HR) 0.1 mg/hr patch Place onto the skin.    ? promethazine (PHENERGAN) 12.5 MG tablet Take 1-2 tablets (12.5-25 mg total) by mouth every 6 (six) hours as needed for nausea or vomiting. 30 tablet 0  ? Pseudoephedrine-guaiFENesin (MUCINEX D MAX STRENGTH PO) Take by mouth.    ? Rimegepant Sulfate (NURTEC) 75 MG TBDP Take 1 tablet by mouth daily as needed. 10 tablet 5  ? rosuvastatin (CRESTOR) 40 MG tablet Take 1 tablet (40 mg total) by mouth daily. 90 tablet 3  ? selenium sulfide (SELSUN BLUE) 1 % LOTN 1 application to wet scalp    ? traMADol (ULTRAM) 50 MG tablet Take 50 mg by mouth every 6 (six) hours as needed.    ? triamcinolone cream (KENALOG) 0.1 % SMARTSIG:1 Application Topical 2-3 Times Daily    ? TURMERIC PO Take 1 tablet by mouth daily.    ? azithromycin (ZITHROMAX) 500 MG tablet Take 1 tablet (500 mg total) by mouth daily. (Patient not taking: Reported on 10/10/2021) 3 tablet 0  ? methocarbamol (ROBAXIN) 500 MG tablet Take 1,000 mg by mouth 2 (two) times daily. (Patient not taking: Reported  on 11/23/2021)    ? venlafaxine (EFFEXOR) 37.5 MG tablet Take 37.5 mg by mouth at bedtime.     ? ?No facility-administered medications prior to visit.  ? ? ?Allergies  ?Allergen Reactions  ? Mushroom Extract Complex Anaphylaxis  ? Peach Flavor Anaphylaxis  ? Adhesive [Tape] Hives  ?  Paper tape only  ? Penicillins Hives, Rash and Other (See Comments)  ?  Tingling and shaking ?PATIENT HAS HAD A PCN REACTION WITH IMMEDIATE RASH, FACIAL/TONGUE/THROAT SWELLING, SOB, OR LIGHTHEADEDNESS WITH HYPOTENSION:  #  #  YES  #  #  ?HAS PT DEVELOPED SEVERE RASH INVOLVING MUCUS MEMBRANES or SKIN NECROSIS: #  #  YES  #  #  ?Has patient had a PCN reaction that required hospitalization No ?Has patient had a PCN reaction occurring within the last 10  years: #  #  #  YES  #  #  #  ?  ? Latex Itching  ? ? ?Review of Systems  ?Constitutional:  Negative for activity change and chills.  ?HENT:  Negative for congestion and voice change.   ?Eyes:  Negative for pain and redness.  ?Respiratory:  Negative for cough and wheezing.   ?Cardiovascular:  Negative for chest pain.  ?Gastrointestinal:  Positive for nausea and vomiting. Negative for constipation and diarrhea.  ?Endocrine: Negative for polyuria.  ?Genitourinary:  Negative for frequency.  ?Skin:  Negative for color change and rash.  ?Allergic/Immunologic: Negative for immunocompromised state.  ?Neurological:  Positive for headaches. Negative for dizziness and speech difficulty.  ?Psychiatric/Behavioral:  Negative for agitation and confusion.   ? ?   ?Objective:  ?  ?Physical Exam ?Vitals and nursing note reviewed.  ?Constitutional:   ?   General: She is not in acute distress. ?   Appearance: Normal appearance. She is not ill-appearing.  ?HENT:  ?   Head: Normocephalic and atraumatic.  ?   Right Ear: External ear normal.  ?   Left Ear: External ear normal.  ?   Nose: No congestion.  ?Eyes:  ?   Extraocular Movements: Extraocular movements intact.  ?   Conjunctiva/sclera: Conjunctivae normal.  ?   Pupils: Pupils are equal, round, and reactive to light.  ?Cardiovascular:  ?   Rate and Rhythm: Normal rate and regular rhythm.  ?   Pulses: Normal pulses.  ?   Heart sounds: Normal heart sounds.  ?Pulmonary:  ?   Effort: Pulmonary effort is normal.  ?   Breath sounds: Normal breath sounds. No wheezing.  ?Abdominal:  ?   General: Bowel sounds are normal.  ?   Palpations: Abdomen is soft.  ?Musculoskeletal:  ?   Cervical back: Normal range of motion and neck supple.  ?   Right lower leg: No edema.  ?   Left lower leg: No edema.  ?Skin: ?   General: Skin is warm and dry.  ?   Findings: No bruising.  ?Neurological:  ?   General: No focal deficit present.  ?   Mental Status: She is alert and oriented to person, place, and time.   ?Psychiatric:     ?   Mood and Affect: Mood normal.     ?   Behavior: Behavior normal.     ?   Thought Content: Thought content normal.  ? ? ?BP 140/80   Pulse 76   Wt 204 lb (92.5 kg)   SpO2 98%   BMI 32.93 kg/m?  ? ?Wt Readings from Last 3 Encounters:  ?11/23/21 204  lb (92.5 kg)  ?10/10/21 205 lb (93 kg)  ?08/18/21 205 lb (93 kg)  ? ? ?Results for orders placed or performed during the hospital encounter of 08/12/21  ?Lactic acid, plasma  ?Result Value Ref Range  ? Lactic Acid, Venous 1.5 0.5 - 1.9 mmol/L  ?CBC  ?Result Value Ref Range  ? WBC 8.5 4.0 - 10.5 K/uL  ? RBC 4.70 3.87 - 5.11 MIL/uL  ? Hemoglobin 12.9 12.0 - 15.0 g/dL  ? HCT 40.7 36.0 - 46.0 %  ? MCV 86.6 80.0 - 100.0 fL  ? MCH 27.4 26.0 - 34.0 pg  ? MCHC 31.7 30.0 - 36.0 g/dL  ? RDW 13.5 11.5 - 15.5 %  ? Platelets 266 150 - 400 K/uL  ? nRBC 0.0 0.0 - 0.2 %  ?Basic metabolic panel  ?Result Value Ref Range  ? Sodium 134 (L) 135 - 145 mmol/L  ? Potassium 3.7 3.5 - 5.1 mmol/L  ? Chloride 101 98 - 111 mmol/L  ? CO2 26 22 - 32 mmol/L  ? Glucose, Bld 158 (H) 70 - 99 mg/dL  ? BUN 10 6 - 20 mg/dL  ? Creatinine, Ser 0.81 0.44 - 1.00 mg/dL  ? Calcium 8.4 (L) 8.9 - 10.3 mg/dL  ? GFR, Estimated >60 >60 mL/min  ? Anion gap 7 5 - 15  ? ? ?   ?Assessment & Plan:  ?1. Chronic migraine without aura without status migrainosus, not intractable ?- Ambulatory referral to Neurology ?- ketorolac (TORADOL) injection 60 mg ?- methylPREDNISolone acetate (DEPO-MEDROL) injection 80 mg ?- ketorolac (TORADOL) injection 60 mg ?- methylPREDNISolone acetate (DEPO-MEDROL) injection 80 mg ? ?2. Nausea and vomiting, unspecified vomiting type ?- ondansetron (ZOFRAN) 4 MG tablet; Take 1 tablet (4 mg total) by mouth every 8 (eight) hours as needed for nausea or vomiting.  Dispense: 20 tablet; Refill: 0 ?Bland diet ? ? ? ?Meds ordered this encounter  ?Medications  ? ondansetron (ZOFRAN) 4 MG tablet  ?  Sig: Take 1 tablet (4 mg total) by mouth every 8 (eight) hours as needed for nausea or  vomiting.  ?  Dispense:  20 tablet  ?  Refill:  0  ?  Order Specific Question:   Supervising Provider  ?  Answer:   Ronnald Nian [[id number]]  ? DISCONTD: ketorolac (TORADOL) injection 60 mg  ? DISCONTD: Aundra Millet

## 2021-11-24 ENCOUNTER — Encounter: Payer: Self-pay | Admitting: Physician Assistant

## 2021-11-24 NOTE — Assessment & Plan Note (Addendum)
Improved after receiving toradol 60 mg IM and depo-medrol 80 mg IM in office today; will refer back to Neurology for evaluation; if headache gets worse, please call 911 / EMS for help or go to the Emergency Department for further evaluation ? ?

## 2021-12-01 IMAGING — CT CT RENAL STONE PROTOCOL
2 of 4 series · 15 of 46 positions shown, 17 images · non-contrast
Comparison: 01/12/2020

CLINICAL DATA: 49-year-old with hematuria and right flank pain.

EXAM:
CT ABDOMEN AND PELVIS WITHOUT CONTRAST
TECHNIQUE: Multidetector CT imaging of the abdomen and pelvis was performed
following the standard protocol without IV contrast.

[Series 2: axial st · axial · 0.95mm/px · z∈[-499,-69]mm · 12 of 102 slices shown, 14 images]
[im 8/102  soft-tissue]
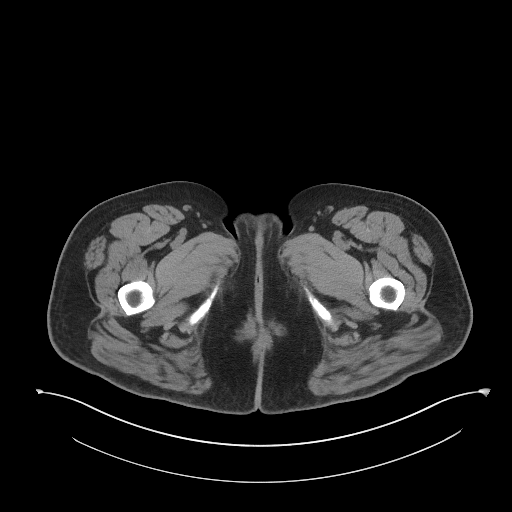
[im 8/102  bone]
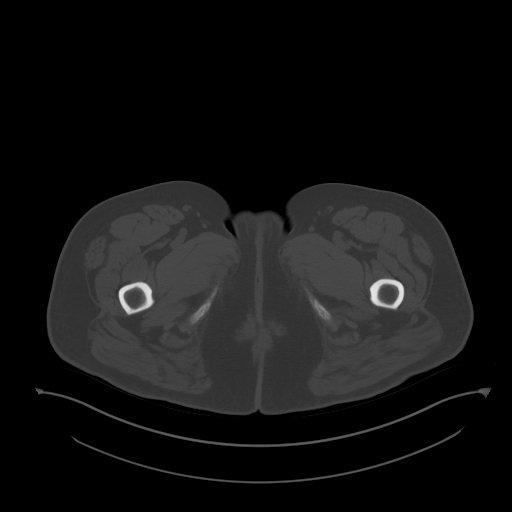
[im 16/102  soft-tissue]
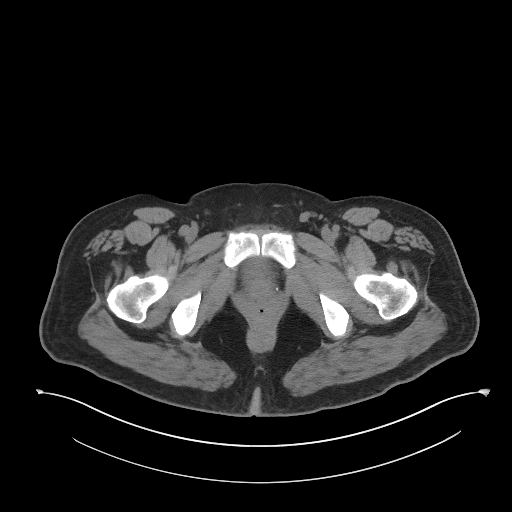
[im 24/102  soft-tissue]
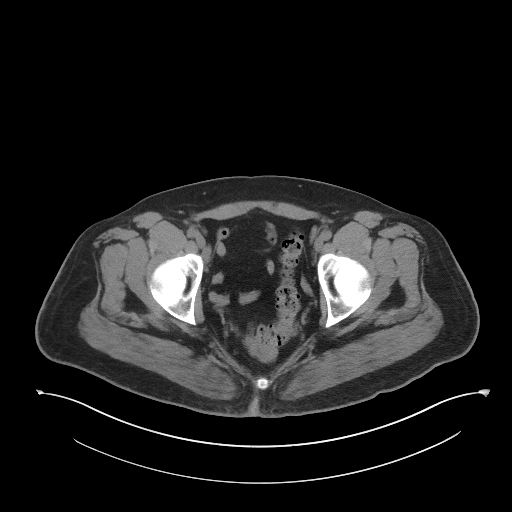
[im 32/102  soft-tissue]
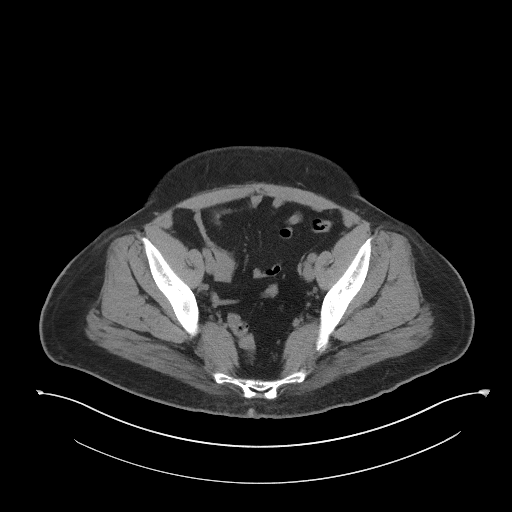
[im 39/102  soft-tissue]
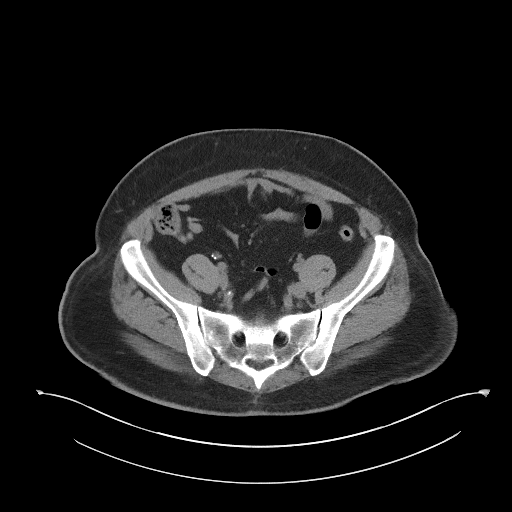
[im 47/102  soft-tissue]
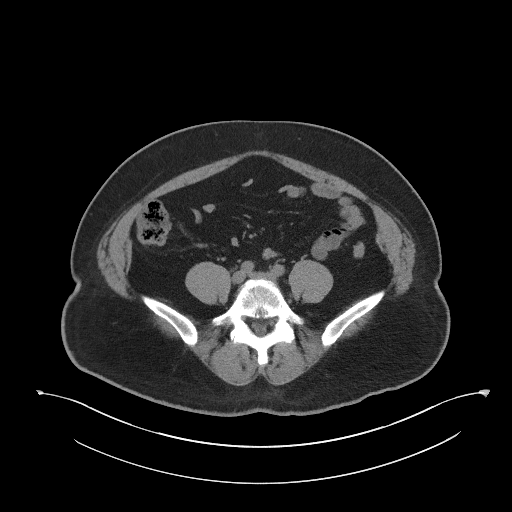
[im 55/102  soft-tissue]
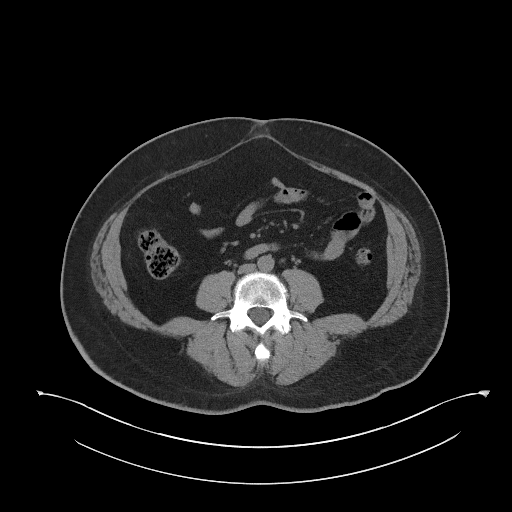
[im 63/102  soft-tissue]
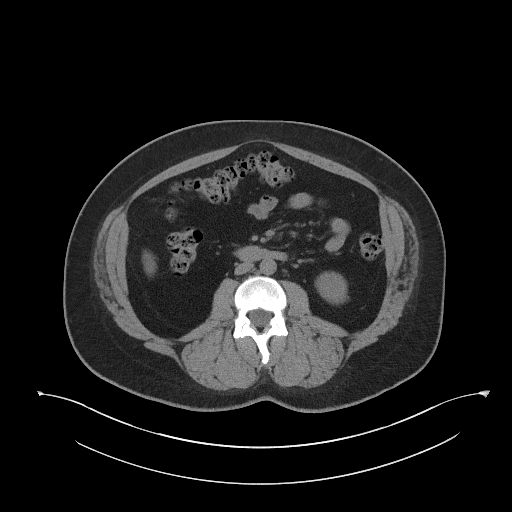
[im 70/102  soft-tissue]
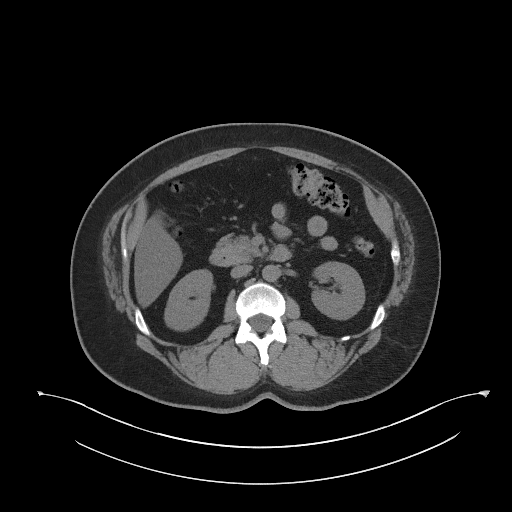
[im 70/102  bone]
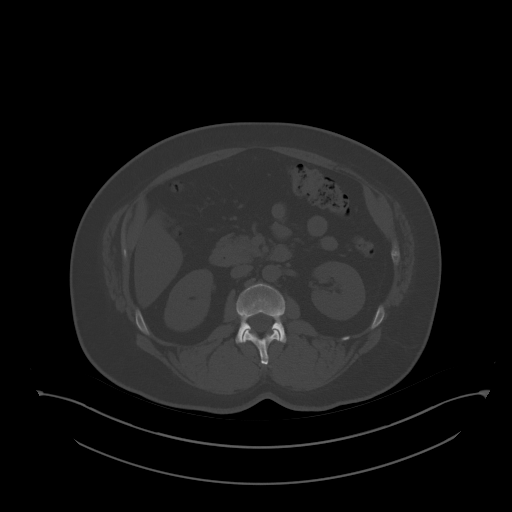
[im 78/102  soft-tissue]
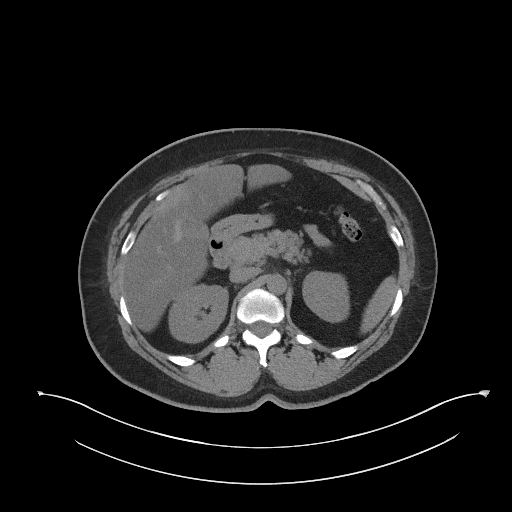
[im 86/102  soft-tissue]
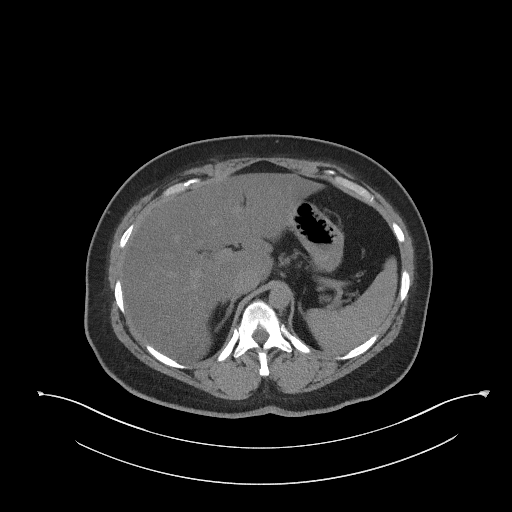
[im 94/102  soft-tissue]
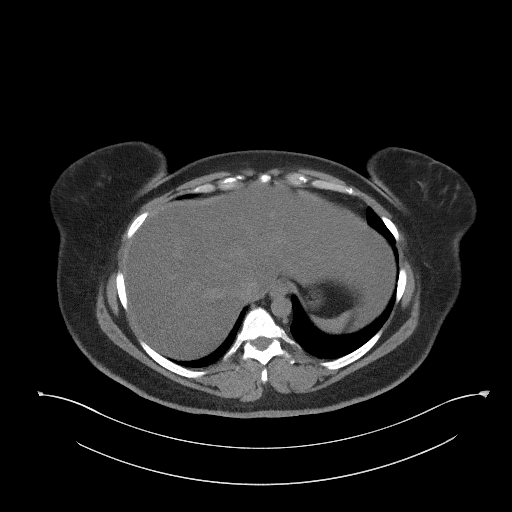

[Series 5: coronal st · coronal · 0.87mm/px · 3 of 98 slices shown]
[im 33/98  soft-tissue]
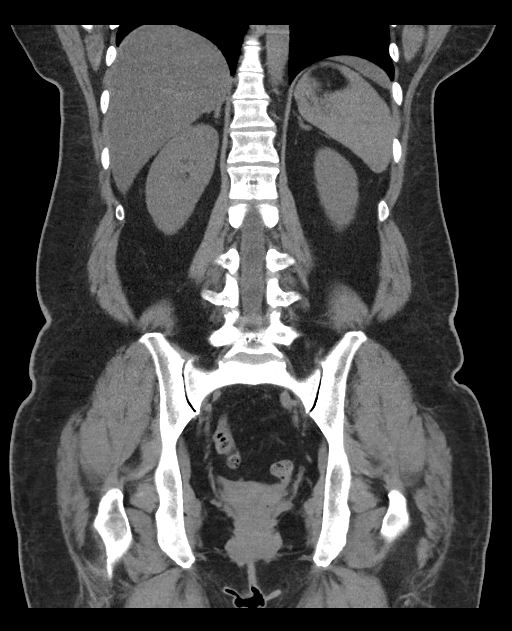
[im 44/98  soft-tissue]
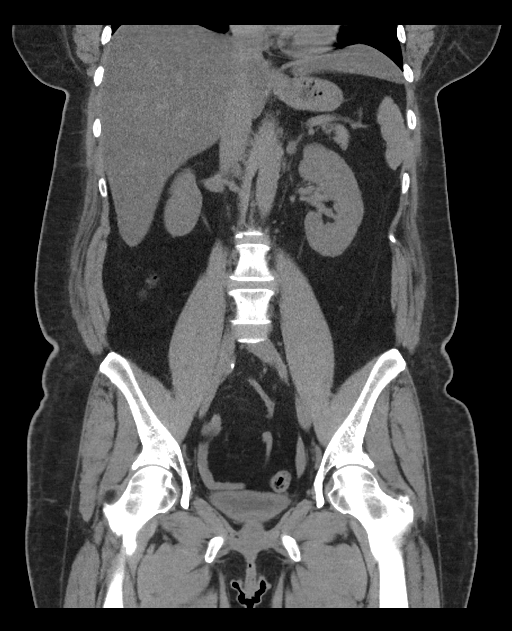
[im 54/98  soft-tissue]
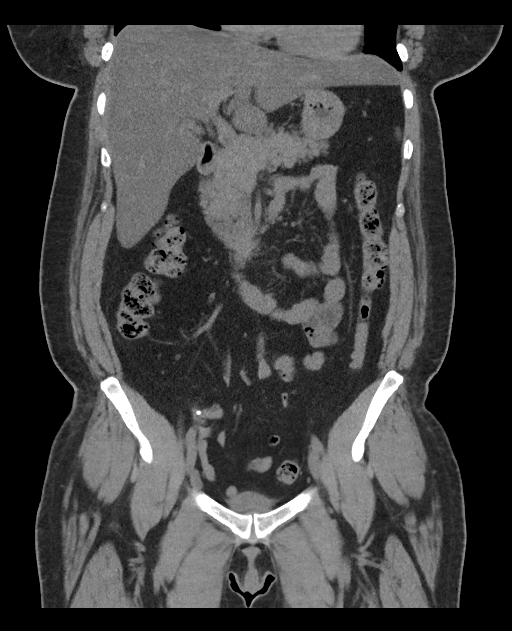

[15 of 46 positions shown; findings below may reference images not displayed]

FINDINGS: Lower chest: Lung bases are clear.

Hepatobiliary: Diffuse low density throughout the liver is
compatible with hepatic steatosis. The entire right hepatic dome is
not imaged. Nodular structure along the anterior aspect of the
caudate appears to be related to the liver and there was a similar
finding dating back to 11/24/2006. Gallbladder is not distended.

Pancreas: Unremarkable. No pancreatic ductal dilatation or
surrounding inflammatory changes.

Spleen: Normal in size without focal abnormality.

Adrenals/Urinary Tract: Normal appearance of the adrenal glands.
Small amount of fluid in the urinary bladder. Negative for kidney or
ureter stones. There is a small calcification in the right
hemipelvis on sequence 2, image 74 that is chronic and likely
represents a phlebolith rather than a ureter stone. Additional
phleboliths involving the right ovarian veins. No hydronephrosis and
no suspicious renal lesions.

Stomach/Bowel: Stomach is within normal limits. No evidence of bowel
wall thickening, distention, or inflammatory changes. Incidentally,
there is a small calcification or appendicolith associated with the
appendix. No appendix inflammation.

Vascular/Lymphatic: No significant vascular findings are present. No
enlarged abdominal or pelvic lymph nodes.

Reproductive: Uterus has been removed. Rounded soft tissue structure
along the left side of the pelvis on sequence 2 image 81 measures
roughly 2.2 cm and stable from 6167. Left ovary/adnexa is grossly
stable. No significant change in the right adnexal region.

Other: Negative for ascites.  Periumbilical hernia containing fat.

Musculoskeletal: No acute bone abnormality.
IMPRESSION: 1. Negative for kidney stones or hydronephrosis.
2. Post hysterectomy. Stable rounded soft tissue along the left
posterior aspect of the pelvis. This finding has not significantly
changed since 01/12/2020 but may be new since 11/24/2006. Soft
tissue in this area is indeterminate and consider non emergent
pelvic ultrasound for further characterization.
3. Small appendicolith without appendix inflammation.
4. Hepatic steatosis
5. Periumbilical hernia containing fat.

## 2022-01-01 ENCOUNTER — Other Ambulatory Visit: Payer: Self-pay

## 2022-01-01 ENCOUNTER — Encounter (HOSPITAL_BASED_OUTPATIENT_CLINIC_OR_DEPARTMENT_OTHER): Payer: Self-pay

## 2022-01-01 ENCOUNTER — Emergency Department (HOSPITAL_BASED_OUTPATIENT_CLINIC_OR_DEPARTMENT_OTHER)
Admission: EM | Admit: 2022-01-01 | Discharge: 2022-01-02 | Disposition: A | Discharge status: Home / Self Care | Payer: BC Managed Care – PPO | Attending: Emergency Medicine | Admitting: Emergency Medicine

## 2022-01-01 DIAGNOSIS — R112 Nausea with vomiting, unspecified: Secondary | ICD-10-CM | POA: Diagnosis not present

## 2022-01-01 DIAGNOSIS — Z7984 Long term (current) use of oral hypoglycemic drugs: Secondary | ICD-10-CM | POA: Insufficient documentation

## 2022-01-01 DIAGNOSIS — Z794 Long term (current) use of insulin: Secondary | ICD-10-CM | POA: Diagnosis not present

## 2022-01-01 DIAGNOSIS — R1011 Right upper quadrant pain: Secondary | ICD-10-CM | POA: Insufficient documentation

## 2022-01-01 DIAGNOSIS — E119 Type 2 diabetes mellitus without complications: Secondary | ICD-10-CM | POA: Insufficient documentation

## 2022-01-01 DIAGNOSIS — R509 Fever, unspecified: Secondary | ICD-10-CM | POA: Insufficient documentation

## 2022-01-01 DIAGNOSIS — Z91040 Latex allergy status: Secondary | ICD-10-CM | POA: Insufficient documentation

## 2022-01-01 LAB — URINALYSIS, ROUTINE W REFLEX MICROSCOPIC
Bilirubin Urine: NEGATIVE
Glucose, UA: NEGATIVE mg/dL
Hgb urine dipstick: NEGATIVE
Ketones, ur: NEGATIVE mg/dL
Leukocytes,Ua: NEGATIVE
Nitrite: NEGATIVE
Protein, ur: NEGATIVE mg/dL
Specific Gravity, Urine: >=1.03 (ref 1.005–1.030)
pH: 5 (ref 5.0–8.0)

## 2022-01-01 LAB — COMPREHENSIVE METABOLIC PANEL
ALT: 42 U/L (ref 0–44)
AST: 37 U/L (ref 15–41)
Albumin: 4 g/dL (ref 3.5–5.0)
Alkaline Phosphatase: 80 U/L (ref 38–126)
Anion gap: 8 (ref 5–15)
BUN: 14 mg/dL (ref 6–20)
CO2: 26 mmol/L (ref 22–32)
Calcium: 9.1 mg/dL (ref 8.9–10.3)
Chloride: 102 mmol/L (ref 98–111)
Creatinine, Ser: 0.78 mg/dL (ref 0.44–1.00)
GFR, Estimated: >60 mL/min (ref >60)
Glucose, Bld: 170 mg/dL — ABNORMAL HIGH (ref 70–99)
Potassium: 4 mmol/L (ref 3.5–5.1)
Sodium: 136 mmol/L (ref 135–145)
Total Bilirubin: 0.6 mg/dL (ref 0.3–1.2)
Total Protein: 7.8 g/dL (ref 6.5–8.1)

## 2022-01-01 LAB — CBC
HCT: 41.7 % (ref 36.0–46.0)
Hemoglobin: 13.8 g/dL (ref 12.0–15.0)
MCH: 28.2 pg (ref 26.0–34.0)
MCHC: 33.1 g/dL (ref 30.0–36.0)
MCV: 85.3 fL (ref 80.0–100.0)
Platelets: 267 10*3/uL (ref 150–400)
RBC: 4.89 MIL/uL (ref 3.87–5.11)
RDW: 14.2 % (ref 11.5–15.5)
WBC: 9.8 10*3/uL (ref 4.0–10.5)
nRBC: 0 % (ref 0.0–0.2)

## 2022-01-01 LAB — LIPASE, BLOOD: Lipase: 51 U/L (ref 11–51)

## 2022-01-01 NOTE — ED Provider Notes (Signed)
?[facility] EMERGENCY DEPARTMENT ?Provider Note ? ? ?CSN: [id number] ?Arrival date & time: 01/01/22  2031 ? ?  ? ?History ? ?Chief Complaint  ?Patient presents with  ? Back Pain  ? Abdominal Pain  ?  RUQ pain  ? ? ?Karen Hicks is a 51 y.o. female. ? ?The history is provided by the patient.  ?Back Pain ?Associated symptoms: abdominal pain   ?Abdominal Pain ?She has history of diabetes, hyperlipidemia, GERD, pulmonary embolism but not currently on anticoagulants and comes in complaining of pain in the right flank for the last week.  Pain waxes and wanes with no particular pattern and does radiate to the right upper quadrant.  There has been some nausea and vomiting.  She has had low-grade fevers up to 101 with associated chills and sweats.  She denies any constipation or diarrhea.  She denies any urinary urgency, frequency, tenesmus, dysuria.  In the past, she has been evaluated for gallbladder problems and did see a surgeon and a gastroenterologist but has not had surgery. ?  ?Home Medications ?Prior to Admission medications   ?Medication Sig Start Date End Date Taking? Authorizing Provider  ?albuterol (VENTOLIN HFA) 108 (90 Base) MCG/ACT inhaler Inhale 2 puffs into the lungs every 6 (six) hours as needed for wheezing or shortness of breath. 04/20/21   Ronnald Nian, MD  ?azithromycin (ZITHROMAX) 500 MG tablet Take 1 tablet (500 mg total) by mouth daily. ?Patient not taking: Reported on 10/10/2021 08/18/21   Ronnald Nian, MD  ?clobetasol (TEMOVATE) 0.05 % external solution Apply topically. 10/20/20   [provider]  ?Colloidal Oatmeal (AVEENO ECZEMA THERAPY EX) Apply 1 application topically 2 (two) times daily.    [provider]  ?dapagliflozin propanediol (FARXIGA) 10 MG TABS tablet Take 1 tablet (10 mg total) by mouth daily before breakfast. 12/05/20   Ronnald Nian, MD  ?diphenhydrAMINE (BENADRYL) 25 MG tablet Take 25 mg by mouth daily as needed for allergies.    [provider]  ?Dulaglutide (TRULICITY) 1.5 MG/0.5ML SOPN Inject 1.5 mg into the skin once a week. 12/05/20   Ronnald Nian, MD  ?esomeprazole (NEXIUM) 20 MG capsule Take 1 capsule (20 mg total) by mouth daily before breakfast. ?Patient taking differently: Take 20 mg by mouth 2 (two) times daily as needed (for heartburn). 06/10/13   Ronnald Nian, MD  ?glucose blood test strip Use as instructed 01/21/18   Ronnald Nian, MD  ?lisinopril (ZESTRIL) 5 MG tablet Take 1 tablet (5 mg total) by mouth daily. 04/20/21   Ronnald Nian, MD  ?loratadine (CLARITIN) 10 MG tablet Take 10 mg by mouth daily as needed for allergies.    [provider]  ?metFORMIN (GLUCOPHAGE) 1000 MG tablet TAKE 1 TABLET BY MOUTH EVERY DAY WITH BREAKFAST 04/20/21   Ronnald Nian, MD  ?methocarbamol (ROBAXIN) 500 MG tablet Take 1,000 mg by mouth 2 (two) times daily. ?Patient not taking: Reported on 11/23/2021 08/17/21   [provider]  ?Minoxidil (ROGAINE MENS EXTRA STRENGTH) 5 % FOAM See admin instructions. 10/20/20   [provider]  ?naproxen (NAPROSYN) 500 MG tablet Take 1 tablet (500 mg total) by mouth 2 (two) times daily. 04/23/21   Vanetta Mulders, MD  ?nitroGLYCERIN (NITRODUR - DOSED IN MG/24 HR) 0.1 mg/hr patch Place onto the skin.    [provider]  ?ondansetron (ZOFRAN) 4 MG tablet Take 1 tablet (4 mg total) by mouth every 8 (eight) hours as needed for nausea  or vomiting. 11/23/21   Jake SharkWilliams, Lynne B, PA-C  ?promethazine (PHENERGAN) 12.5 MG tablet Take 1-2 tablets (12.5-25 mg total) by mouth every 6 (six) hours as needed for nausea or vomiting. 08/19/18   Elodia FlorenceNida, Andrew, PA-C  ?Pseudoephedrine-guaiFENesin (MUCINEX D MAX STRENGTH PO) Take by mouth.    [provider]  ?Rimegepant Sulfate (NURTEC) 75 MG TBDP Take 1 tablet by mouth daily as needed. 10/24/21   Ronnald NianLalonde, John C, MD  ?rosuvastatin (CRESTOR) 40 MG tablet Take 1 tablet (40 mg total) by mouth daily. 03/09/21   Ronnald NianLalonde, John C, MD  ?selenium  sulfide (SELSUN BLUE) 1 % LOTN 1 application to wet scalp 10/20/20   [provider]  ?traMADol (ULTRAM) 50 MG tablet Take 50 mg by mouth every 6 (six) hours as needed. 08/09/21   [provider]  ?triamcinolone cream (KENALOG) 0.1 % SMARTSIG:1 Application Topical 2-3 Times Daily 05/12/21   [provider]  ?TURMERIC PO Take 1 tablet by mouth daily.    [provider]  ?venlafaxine (EFFEXOR) 37.5 MG tablet Take 37.5 mg by mouth at bedtime.  12/16/19   [provider]  ?   ? ?Allergies    ?Mushroom extract complex, Peach flavor, Adhesive [tape], Penicillins, and Latex   ? ?Review of Systems   ?Review of Systems  ?Gastrointestinal:  Positive for abdominal pain.  ?Musculoskeletal:  Positive for back pain.  ?All other systems reviewed and are negative. ? ?Physical Exam ?Updated Vital Signs ?BP (!) 149/84 (BP Location: Left Arm)   Pulse 83   Temp 98.4 ?F (36.9 ?C) (Oral)   Resp 18   Ht 5\' 6"  (1.676 m)   Wt 93 kg   SpO2 98%   BMI 33.09 kg/m?  ?Physical Exam ?Vitals and nursing note reviewed.  ?51 year old female, resting comfortably and in no acute distress. Vital signs are significant for elevated blood pressure. Oxygen saturation is 98%, which is normal. ?Head is normocephalic and atraumatic. PERRLA, EOMI. Oropharynx is clear. ?Neck is nontender and supple without adenopathy or JVD. ?Back is nontender and there is no CVA tenderness. ?Lungs are clear without rales, wheezes, or rhonchi. ?Chest is nontender. ?Heart has regular rate and rhythm without murmur. ?Abdomen is soft, flat, with moderate right upper quadrant tenderness with +/- Murphy sign.  There are no masses or hepatosplenomegaly and peristalsis is normoactive. ?Extremities have no cyanosis or edema, full range of motion is present. ?Skin is warm and dry without rash. ?Neurologic: Mental status is normal, cranial nerves are intact, moves all extremities equally. ? ?ED Results / Procedures / Treatments   ?Labs ?(all  labs ordered are listed, but only abnormal results are displayed) ?Labs Reviewed  ?COMPREHENSIVE METABOLIC PANEL - Abnormal; Notable for the following components:  ?    Result Value  ? Glucose, Bld 170 (*)   ? All other components within normal limits  ?LIPASE, BLOOD  ?CBC  ?URINALYSIS, ROUTINE W REFLEX MICROSCOPIC  ? ?Radiology ?CT Renal Stone Study ? ?Result Date: 01/02/2022 ?CLINICAL DATA:  Right-sided back pain radiating to the right upper quadrant x1 week. EXAM: CT ABDOMEN AND PELVIS WITHOUT CONTRAST TECHNIQUE: Multidetector CT imaging of the abdomen and pelvis was performed following the standard protocol without IV contrast. RADIATION DOSE REDUCTION: This exam was performed according to the departmental dose-optimization program which includes automated exposure control, adjustment of the mA and/or kV according to patient size and/or use of iterative reconstruction technique. COMPARISON:  Jan 17, 2021 FINDINGS: Lower chest: No acute abnormality. Hepatobiliary:  There is diffuse fatty infiltration of the liver parenchyma. No focal liver abnormality is seen. Gallbladder appears to be markedly contracted. No gallstones, gallbladder wall thickening, or biliary dilatation. Pancreas: Unremarkable. No pancreatic ductal dilatation or surrounding inflammatory changes. Spleen: Normal in size without focal abnormality. Adrenals/Urinary Tract: Adrenal glands are unremarkable. Kidneys are normal, without renal calculi, focal lesion, or hydronephrosis. Bladder is unremarkable. Stomach/Bowel: Stomach is within normal limits. Appendix appears normal. No evidence of bowel wall thickening, distention, or inflammatory changes. Vascular/Lymphatic: No significant vascular findings are present. No enlarged abdominal or pelvic lymph nodes. Reproductive: Status post hysterectomy. No adnexal masses. Other: A very small, stable right-sided fat containing paraumbilical hernia is seen. A stable 2.0 cm area of soft tissue attenuation  (approximately 48.75 Hounsfield units) is again seen within the posterior aspect of the pelvis on the left. No abdominopelvic ascites. Musculoskeletal: No acute or significant osseous findings. IMPRESSION: 1. Hepat

## 2022-01-01 NOTE — ED Triage Notes (Signed)
Patient complains of R back pain which radiates to front RUQ.  PMH of cholecystitis, fatty liver and diabetes mellitus.  Pain began about 1 week ago.  Intermittent nausea.  Pain is worse when patient takes a deep breath in.  Patient has not had a cholecystectomy.  ?

## 2022-01-02 ENCOUNTER — Emergency Department (HOSPITAL_BASED_OUTPATIENT_CLINIC_OR_DEPARTMENT_OTHER): Payer: BC Managed Care – PPO

## 2022-01-02 DIAGNOSIS — R1011 Right upper quadrant pain: Secondary | ICD-10-CM | POA: Diagnosis not present

## 2022-01-02 MED ORDER — OXYCODONE HCL 5 MG PO TABS: 5.0000 mg | ORAL_TABLET | ORAL | 0 refills | Status: DC | PRN

## 2022-01-02 NOTE — Discharge Instructions (Signed)
Your evaluation today did not show any gallstones and did not show any serious cause for your pain.  However, sometimes you can have gallbladder pain without gallstones.  Please follow-up with your gastroenterologist to may want to repeat the nuclear medicine gallbladder test. ? ?Return to the emergency department if pain is persisting or getting worse. ?

## 2022-01-02 NOTE — ED Notes (Signed)
Patient discharged to home.  All discharge instructions reviewed.  Patient verbalized understanding via teachback method.  VS WDL.  Respirations even and unlabored.  Ambulatory out of ED.   °

## 2022-01-03 ENCOUNTER — Telehealth: Payer: Self-pay

## 2022-01-03 NOTE — Telephone Encounter (Signed)
Transition Care Management Unsuccessful Follow-up Telephone Call ? ?Date of discharge and from where:  01/02/2022 from [facility] ? ?Attempts:  1st Attempt ? ?Reason for unsuccessful TCM follow-up call:  Left voice message ? ? ? ?

## 2022-01-04 NOTE — Telephone Encounter (Signed)
Transition Care Management Unsuccessful Follow-up Telephone Call ? ?Date of discharge and from where:  01/02/2022 from [facility] ? ?Attempts:  2nd Attempt ? ?Reason for unsuccessful TCM follow-up call:  Left voice message ? ? ? ?

## 2022-01-05 NOTE — Telephone Encounter (Signed)
Transition Care Management Unsuccessful Follow-up Telephone Call ? ?Date of discharge and from where:  01/02/2022-[facility] ? ?Attempts:  3rd Attempt ? ?Reason for unsuccessful TCM follow-up call:  Left voice message ? ?  ?

## 2022-02-08 ENCOUNTER — Ambulatory Visit: Payer: BC Managed Care – PPO | Admitting: Family Medicine

## 2022-02-08 VITALS — BP 130/74 | HR 87 | Temp 97.5°F | Wt 211.8 lb

## 2022-02-08 DIAGNOSIS — K760 Fatty (change of) liver, not elsewhere classified: Secondary | ICD-10-CM | POA: Diagnosis not present

## 2022-02-08 DIAGNOSIS — N390 Urinary tract infection, site not specified: Secondary | ICD-10-CM

## 2022-02-08 LAB — POCT URINALYSIS DIP (PROADVANTAGE DEVICE)
Bilirubin, UA: NEGATIVE
Blood, UA: NEGATIVE
Glucose, UA: >=1000 mg/dL — AB
Leukocytes, UA: NEGATIVE
Nitrite, UA: POSITIVE — AB
Protein Ur, POC: NEGATIVE mg/dL
Specific Gravity, Urine: 1.03
Urobilinogen, Ur: 0.2
pH, UA: 6 (ref 5.0–8.0)

## 2022-02-08 MED ORDER — SULFAMETHOXAZOLE-TRIMETHOPRIM 800-160 MG PO TABS: 1.0000 | ORAL_TABLET | Freq: Two times a day (BID) | ORAL | 0 refills | Status: DC

## 2022-02-08 NOTE — Progress Notes (Signed)
   Subjective:    Patient ID: Karen Hicks, female    DOB: 02/28/1971, 51 y.o.   MRN: [id number]  HPI She is here for evaluation of intermittent right flank and upper quadrant pain. She was seen in the ED May 1 for this.  Blood work as well as CT scans were done.  They were reviewed by me.  It did show evidence of hepatic steatosis. Since then she has had continued difficulty with right flank and upper quadrant pain but no nausea, vomiting, bloating, gas, urgency or dysuria.  No blood in her stool or urine. Review of Systems     Objective:   Physical Exam Alert and in no distress.  Cardiac exam shows regular rhythm without murmurs gallops.  Lungs clear to auscultation.  Abdominal exam shows no masses or tenderness with normal bowel sounds.  Urine microscopic was positive for nitrite.       Assessment & Plan:  Urinary tract infection without hematuria, site unspecified - Plan: sulfamethoxazole-trimethoprim (BACTRIM DS) 800-160 MG tablet  Hepatic steatosis I will go ahead and treat her however if she does not continue to improve, I will need to reevaluate .

## 2022-02-08 NOTE — Addendum Note (Signed)
Addended by: Renelda Loma on: 02/08/2022 03:30 PM   Modules accepted: Orders

## 2022-02-28 LAB — HM MAMMOGRAPHY

## 2022-03-04 LAB — HM DIABETES EYE EXAM

## 2022-03-07 ENCOUNTER — Encounter: Payer: Self-pay | Admitting: Family Medicine

## 2022-03-08 NOTE — Progress Notes (Signed)
Karen Hicks D.[name] [address] [place] [address] Phone: [phone]   Assessment and Plan:     1. Rib cage dysfunction 2. Somatic dysfunction of thoracic region 3. Somatic dysfunction of lumbar region 4. 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  If still having pain after 2 weeks, complete 3rd-week of meloxicam. May use remaining meloxicam as needed once daily for pain control.  Do not to use additional NSAIDs while taking meloxicam.  May use Tylenol [phone] mg 2 to 3 times a day for breakthrough pain. - Encourage patient to continue her core and back strengthening - Patient elected for initial OMT today.  Tolerated well per note below. - Decision today to treat with OMT was based on Physical Exam  After verbal consent patient was treated with HVLA (high velocity low amplitude), ME (muscle energy), FPR (flex positional release), ST (soft tissue), PC/PD (Pelvic Compression/ Pelvic Decompression) techniques in , rib, thoracic, lumbar,  areas. Patient tolerated the procedure well with improvement in symptoms.  Patient educated on potential side effects of soreness and recommended to rest, hydrate, and use Tylenol as needed for pain control.    Pertinent previous records reviewed include abdominal ultrasound 01/02/2022, CT renal stone 01/02/2022, family medicine note 02/08/2022   Follow Up: 3 weeks for reevaluation.  Would discontinue meloxicam at that time.  Could repeat OMT if patient found it beneficial   Subjective:   I, Karen Hicks, am serving as a Neurosurgeon for Doctor Richardean Sale  Chief Complaint: right  rib 7-8 dysfunction   HPI:   03/09/2022 Patient is a 51 year old female complaining of  rib dysfunction. Patient states went to ER 01/01/2022  Pain waxes and wanes with no particular pattern and does radiate to the right upper quadrant.   She denies any constipation or diarrhea.  She denies any urinary urgency, frequency, tenesmus, dysuria.  In the past, shortness of breath decreased ROM under breast to back been going on since may , states she has  fatty liver and no stones in her gallbladder ,was seen by dalldorf and he recommended OMT.  Receives temporary relief with NSAIDs, but tries not to use them frequently.   Relevant Historical Information: Fatty liver, DM type II, bilateral knee replacement  Additional pertinent review of systems negative.   Current Outpatient Medications:    meloxicam (MOBIC) 15 MG tablet, Take 1 tablet (15 mg total) by mouth daily., Disp: 30 tablet, Rfl: 0   albuterol (VENTOLIN HFA) 108 (90 Base) MCG/ACT inhaler, Inhale 2 puffs into the lungs every 6 (six) hours as needed for wheezing or shortness of breath., Disp: 8 g, Rfl: 0   clobetasol (TEMOVATE) 0.05 % external solution, Apply topically., Disp: , Rfl:    Colloidal Oatmeal (AVEENO ECZEMA THERAPY EX), Apply 1 application topically 2 (two) times daily., Disp: , Rfl:    dapagliflozin propanediol (FARXIGA) 10 MG TABS tablet, Take 1 tablet (10 mg total) by mouth daily before breakfast., Disp: 90 tablet, Rfl: 1   diphenhydrAMINE (BENADRYL) 25 MG tablet, Take 25 mg by mouth daily as needed for allergies., Disp: , Rfl:    Dulaglutide (TRULICITY) 1.5 MG/0.5ML SOPN, Inject 1.5 mg into the skin once a week., Disp: 6 mL, Rfl: 5   esomeprazole (  NEXIUM) 20 MG capsule, Take 1 capsule (20 mg total) by mouth daily before breakfast. (Patient taking differently: Take 20 mg by mouth 2 (two) times daily as needed (for heartburn).), Disp: 30 capsule, Rfl: 11   glucose blood test strip, Use as instructed, Disp: 100 each, Rfl: 12   lisinopril (ZESTRIL) 5 MG tablet, Take 1 tablet (5 mg total) by mouth daily., Disp: 90 tablet,  Rfl: 3   loratadine (CLARITIN) 10 MG tablet, Take 10 mg by mouth daily as needed for allergies., Disp: , Rfl:    metFORMIN (GLUCOPHAGE) 1000 MG tablet, TAKE 1 TABLET BY MOUTH EVERY DAY WITH BREAKFAST, Disp: 90 tablet, Rfl: 1   methocarbamol (ROBAXIN) 500 MG tablet, Take 1,000 mg by mouth 2 (two) times daily., Disp: , Rfl:    Minoxidil (ROGAINE MENS EXTRA STRENGTH) 5 % FOAM, See admin instructions., Disp: , Rfl:    naproxen (NAPROSYN) 500 MG tablet, Take 1 tablet (500 mg total) by mouth 2 (two) times daily., Disp: 14 tablet, Rfl: 0   nitroGLYCERIN (NITRODUR - DOSED IN MG/24 HR) 0.1 mg/hr patch, Place onto the skin., Disp: , Rfl:    ondansetron (ZOFRAN) 4 MG tablet, Take 1 tablet (4 mg total) by mouth every 8 (eight) hours as needed for nausea or vomiting., Disp: 20 tablet, Rfl: 0   oxyCODONE (ROXICODONE) 5 MG immediate release tablet, Take 1 tablet (5 mg total) by mouth every 4 (four) hours as needed for severe pain. (Patient not taking: Reported on 02/08/2022), Disp: 10 tablet, Rfl: 0   promethazine (PHENERGAN) 12.5 MG tablet, Take 1-2 tablets (12.5-25 mg total) by mouth every 6 (six) hours as needed for nausea or vomiting., Disp: 30 tablet, Rfl: 0   Pseudoephedrine-guaiFENesin (MUCINEX D MAX STRENGTH PO), Take by mouth. (Patient not taking: Reported on 02/08/2022), Disp: , Rfl:    Rimegepant Sulfate (NURTEC) 75 MG TBDP, Take 1 tablet by mouth daily as needed., Disp: 10 tablet, Rfl: 5   rosuvastatin (CRESTOR) 40 MG tablet, Take 1 tablet (40 mg total) by mouth daily., Disp: 90 tablet, Rfl: 3   selenium sulfide (SELSUN BLUE) 1 % LOTN, 1 application to wet scalp (Patient not taking: Reported on 02/08/2022), Disp: , Rfl:    sulfamethoxazole-trimethoprim (BACTRIM DS) 800-160 MG tablet, Take 1 tablet by mouth 2 (two) times daily., Disp: 20 tablet, Rfl: 0   traMADol (ULTRAM) 50 MG tablet, Take 50 mg by mouth every 6 (six) hours as needed., Disp: , Rfl:    triamcinolone cream (KENALOG) 0.1 %, SMARTSIG:1  Application Topical 2-3 Times Daily, Disp: , Rfl:    TURMERIC PO, Take 1 tablet by mouth daily. (Patient not taking: Reported on 02/08/2022), Disp: , Rfl:    venlafaxine (EFFEXOR) 37.5 MG tablet, Take 37.5 mg by mouth at bedtime. , Disp: , Rfl:    Objective:     Vitals:   03/09/22 1439  BP: (!) 144/84  Pulse: 89  SpO2: 99%  Weight: 212 lb (96.2 kg)  Height: 5\' 6"  (1.676 m)      Body mass index is 34.22 kg/m.    Physical Exam:    General: Well-appearing, cooperative, sitting comfortably in no acute distress.   OMT Physical Exam:   Rib: Right ribs 6-8 stuck in inhalation Thoracic: TTP paraspinal, T6-8 RRSL Lumbar: NTTP paraspinal, L5 rotated left TTP along entirety of ribs 6-8 on right without flail chest or drop-off   Electronically signed by:  D[facility] 3:49 PM 03/09/22

## 2022-03-09 ENCOUNTER — Ambulatory Visit (INDEPENDENT_AMBULATORY_CARE_PROVIDER_SITE_OTHER): Payer: BC Managed Care – PPO | Admitting: Sports Medicine

## 2022-03-09 VITALS — BP 144/84 | HR 89 | Ht 66.0 in | Wt 212.0 lb

## 2022-03-09 DIAGNOSIS — M9908 Segmental and somatic dysfunction of rib cage: Secondary | ICD-10-CM

## 2022-03-09 DIAGNOSIS — M9902 Segmental and somatic dysfunction of thoracic region: Secondary | ICD-10-CM

## 2022-03-09 DIAGNOSIS — M9903 Segmental and somatic dysfunction of lumbar region: Secondary | ICD-10-CM | POA: Diagnosis not present

## 2022-03-09 MED ORDER — MELOXICAM 15 MG PO TABS: 15.0000 mg | ORAL_TABLET | Freq: Every day | ORAL | 0 refills | Status: DC

## 2022-03-09 NOTE — Patient Instructions (Addendum)
Good to see you  - Start meloxicam 15 mg daily x2 weeks.  If still having pain after 2 weeks, complete 3rd-week of meloxicam. May use remaining meloxicam as needed once daily for pain control.  Do not to use additional NSAIDs while taking meloxicam.  May use Tylenol [phone] mg 2 to 3 times a day for breakthrough pain. 3 week follow up for repeat OMT

## 2022-03-23 NOTE — Progress Notes (Unsigned)
Aleen Sells D.[name] [address] [place] [address] Phone: [phone]   Assessment and Plan:     There are no diagnoses linked to this encounter.  ***   Pertinent previous records reviewed include ***   Follow Up: ***     Subjective:   I, Vikrant Pryce, am serving as a Neurosurgeon for Doctor Richardean Sale   Chief Complaint: right  rib 7-8 dysfunction    HPI:    03/09/2022 Patient is a 51 year old female complaining of rib dysfunction. Patient states went to ER 01/01/2022  Pain waxes and wanes with no particular pattern and does radiate to the right upper quadrant.   She denies any constipation or diarrhea.  She denies any urinary urgency, frequency, tenesmus, dysuria.  In the past, shortness of breath decreased ROM under breast to back been going on since may , states she has  fatty liver and no stones in her gallbladder ,was seen by dalldorf and he recommended OMT.  Receives temporary relief with NSAIDs, but tries not to use them frequently.   03/26/2022 Patient states    Relevant Historical Information: Fatty liver, DM type II, bilateral knee replacement  Additional pertinent review of systems negative.   Current Outpatient Medications:    albuterol (VENTOLIN HFA) 108 (90 Base) MCG/ACT inhaler, Inhale 2 puffs into the lungs every 6 (six) hours as needed for wheezing or shortness of breath., Disp: 8 g, Rfl: 0   clobetasol (TEMOVATE) 0.05 % external solution, Apply topically., Disp: , Rfl:    Colloidal Oatmeal (AVEENO ECZEMA THERAPY EX), Apply 1 application topically 2 (two) times daily., Disp: , Rfl:    dapagliflozin propanediol (FARXIGA) 10 MG TABS tablet, Take 1 tablet (10 mg total) by mouth daily before breakfast., Disp: 90 tablet, Rfl: 1   diphenhydrAMINE (BENADRYL) 25 MG tablet, Take 25 mg by mouth daily as needed for allergies., Disp: , Rfl:    Dulaglutide (TRULICITY) 1.5 MG/0.5ML SOPN, Inject 1.5 mg into the skin once a  week., Disp: 6 mL, Rfl: 5   esomeprazole (NEXIUM) 20 MG capsule, Take 1 capsule (20 mg total) by mouth daily before breakfast. (Patient taking differently: Take 20 mg by mouth 2 (two) times daily as needed (for heartburn).), Disp: 30 capsule, Rfl: 11   glucose blood test strip, Use as instructed, Disp: 100 each, Rfl: 12   lisinopril (ZESTRIL) 5 MG tablet, Take 1 tablet (5 mg total) by mouth daily., Disp: 90 tablet, Rfl: 3   loratadine (CLARITIN) 10 MG tablet, Take 10 mg by mouth daily as needed for allergies., Disp: , Rfl:    meloxicam (MOBIC) 15 MG tablet, Take 1 tablet (15 mg total) by mouth daily., Disp: 30 tablet, Rfl: 0   metFORMIN (GLUCOPHAGE) 1000 MG tablet, TAKE 1 TABLET BY MOUTH EVERY DAY WITH BREAKFAST, Disp: 90 tablet, Rfl: 1   methocarbamol (ROBAXIN) 500 MG tablet, Take 1,000 mg by mouth 2 (two) times daily., Disp: , Rfl:    Minoxidil (ROGAINE MENS EXTRA STRENGTH) 5 % FOAM, See admin instructions., Disp: , Rfl:    naproxen (NAPROSYN) 500 MG tablet, Take 1 tablet (500 mg total) by mouth 2 (two) times daily., Disp: 14 tablet, Rfl: 0   nitroGLYCERIN (NITRODUR - DOSED IN MG/24 HR) 0.1 mg/hr patch, Place onto the skin., Disp: , Rfl:    ondansetron (ZOFRAN) 4 MG tablet, Take 1 tablet (4 mg total) by mouth every 8 (eight) hours as needed for nausea or vomiting., Disp:  20 tablet, Rfl: 0   oxyCODONE (ROXICODONE) 5 MG immediate release tablet, Take 1 tablet (5 mg total) by mouth every 4 (four) hours as needed for severe pain. (Patient not taking: Reported on 02/08/2022), Disp: 10 tablet, Rfl: 0   promethazine (PHENERGAN) 12.5 MG tablet, Take 1-2 tablets (12.5-25 mg total) by mouth every 6 (six) hours as needed for nausea or vomiting., Disp: 30 tablet, Rfl: 0   Pseudoephedrine-guaiFENesin (MUCINEX D MAX STRENGTH PO), Take by mouth. (Patient not taking: Reported on 02/08/2022), Disp: , Rfl:    Rimegepant Sulfate (NURTEC) 75 MG TBDP, Take 1 tablet by mouth daily as needed., Disp: 10 tablet, Rfl: 5    rosuvastatin (CRESTOR) 40 MG tablet, Take 1 tablet (40 mg total) by mouth daily., Disp: 90 tablet, Rfl: 3   selenium sulfide (SELSUN BLUE) 1 % LOTN, 1 application to wet scalp (Patient not taking: Reported on 02/08/2022), Disp: , Rfl:    sulfamethoxazole-trimethoprim (BACTRIM DS) 800-160 MG tablet, Take 1 tablet by mouth 2 (two) times daily., Disp: 20 tablet, Rfl: 0   traMADol (ULTRAM) 50 MG tablet, Take 50 mg by mouth every 6 (six) hours as needed., Disp: , Rfl:    triamcinolone cream (KENALOG) 0.1 %, SMARTSIG:1 Application Topical 2-3 Times Daily, Disp: , Rfl:    TURMERIC PO, Take 1 tablet by mouth daily. (Patient not taking: Reported on 02/08/2022), Disp: , Rfl:    venlafaxine (EFFEXOR) 37.5 MG tablet, Take 37.5 mg by mouth at bedtime. , Disp: , Rfl:    Objective:     There were no vitals filed for this visit.    There is no height or weight on file to calculate BMI.    Physical Exam:    ***   Electronically signed by:  Aleen Sells [name] 8:28 AM 03/23/22

## 2022-03-25 NOTE — Progress Notes (Signed)
NEUROLOGY CONSULTATION NOTE  Karen Hicks MRN: [id number] DOB: [birth year]  Referring provider: Jake Shark, PA-C Primary care provider: Ronnald Nian, MD  Reason for consult:  migraine  Assessment/Plan:   Migraine with aura, without status migrainosus, not intractable  Nurtec still for first line abortive therapy She will try Trudhesa NS as an abortive when she wakes up with a migraine or as second line to Nurtec if migraine persists for 3 hours.   Limit use of pain relievers to no more than 2 days out of week to prevent risk of rebound or medication-overuse headache. Keep headache diary Follow up 6 months.    Subjective:  Karen Hicks is a 51 year old female with sleep apnea, arthritis, fatty liver, and history of PE (1994 related to surgery) who presents for migraines.  History supplemented by referring provider's note.  Onset:  51 years old Location:  used to be back of head.  Now they are across forehead, periorbital region and temples Quality:  pounding Intensity:  10/10.   Aura:  sees spots and haze/wavy lines in her vision Prodrome:  absent Associated symptoms:  Nausea, vomiting, lightheadedness, photophobia, phonophobia, osmophobia.  She denies associated unilateral numbness or weakness. Duration:  Usually 1 hour with Nurtec but at times may last from 2-3 hours up to 2-3 days (severe intractable migraine requiring a shot occurs about 3 times a year) Frequency:  used to be every 4 months, then last year once a month (2 severe in 6 months).  However she hasn't had a migraine since May.  She said she did change jobs which helped.   Triggers:  red or blue drinks (Kool Aid, Gatorade, [place]), chocolate, emotional stress Relieving factors:  ice pack, laying down on a cold floor. Activity:  aggravates  Past NSAIDS/analgesics:  acetaminophen, Excedrin Migraine, ibuprofen, naproxen, tramadol, oxycodone Past abortive triptans:  sumatriptan 100mg ,  frovatriptan, naratriptan, zolmitriptan tab/NS Past abortive ergotamine:  none Past muscle relaxants:  baclofen, methocarbamol Past anti-emetic:  none Past antihypertensive medications:  none Past antidepressant medications:  amitriptyline Past anticonvulsant medications:  topiramate Past anti-CGRP:  none Past vitamins/Herbal/Supplements:  turmeric Past antihistamines/decongestants:  Sudafed Other past therapies:  none  Current NSAIDS/analgesics: meloxicam Current triptans:  none Current ergotamine:  none Current anti-emetic:  ondansetron 4mg , promethazine 12.5-25mg  Current muscle relaxants:  none Current Antihypertensive medications:  lisinopril Current Antidepressant medications:  venlafaxine 37.5mg  QHS Current Anticonvulsant medications:  none Current anti-CGRP:  Nurtec (rescue) Current Vitamins/Herbal/Supplements:  nnone Current Antihistamines/Decongestants:  Benadryl, loradiine Other therapy:  none Hormone/birth control:  none   Caffeine:  No coffee.  Sometimes tea Alcohol:  no Smoker:  no Diet:  Hydrates.  Watches diet Exercise:  walking, swimming, stairs or low-impact weight training 3 to 4 times a week Depression:  no; Anxiety:  no Other pain:  bilateral knee and shoulder pain - h/o surgeries Sleep hygiene:  Sleeps 7 hours.  Had sleep apnea.  Stopped CPAP because it was controlled but she has had some weight gain.  Feels a little fatigued during day but attributes it to diabetes medications.   Family history of headache:  mom, dad, grandparents, brothers, sisters, aunts, uncles.      PAST MEDICAL HISTORY: Past Medical History:  Diagnosis Date   Allergy    Anemia    when she was young   Arthritis    Complication of anesthesia    takes her alittle longer to wake up   GERD (gastroesophageal reflux disease)    Irritable bowel  Lactose intolerance    Migraine    PE (pulmonary embolism)    in 1994 meniscus repair.  Saw Dr. Susann Givens   Pneumonia    PONV  (postoperative nausea and vomiting)    Pre-diabetes    Restless legs syndrome    Sleep apnea    2007.  Uses the mask & machine when she needs it.  Dr. 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  07/19/2014   right wrist arthroscopy  2009   TOTAL ABDOMINAL HYSTERECTOMY  01/2006   FIBROIDS   TOTAL KNEE ARTHROPLASTY Left 08/03/2014   dr Jerl Santos   TOTAL KNEE ARTHROPLASTY Left 08/03/2014   Procedure: LEFT TOTAL KNEE ARTHROPLASTY;  Surgeon: Velna Ochs, MD;  Location: [facility] OR;  Service: Orthopedics;  Laterality: Left;   TOTAL KNEE ARTHROPLASTY Right 07/31/2016   Procedure: TOTAL KNEE ARTHROPLASTY;  Surgeon: Marcene Corning, MD;  Location: [facility] OR;  Service: Orthopedics;  Laterality: Right;   UPPER GI ENDOSCOPY  02/18/2020    MEDICATIONS: Current Outpatient Medications on File Prior to Visit  Medication Sig Dispense Refill   albuterol (VENTOLIN HFA) 108 (90 Base) MCG/ACT inhaler Inhale 2 puffs into the lungs every 6 (six) hours as needed for wheezing or shortness of breath. 8 g 0   clobetasol (TEMOVATE) 0.05 % external solution Apply topically.     Colloidal Oatmeal (AVEENO ECZEMA THERAPY EX) Apply 1 application topically 2 (two) times daily.     dapagliflozin propanediol (FARXIGA) 10 MG TABS tablet Take 1 tablet (10 mg total) by mouth daily before breakfast. 90 tablet 1   diphenhydrAMINE (BENADRYL) 25 MG tablet Take 25 mg by mouth daily as needed for  allergies.     Dulaglutide (TRULICITY) 1.5 MG/0.5ML SOPN Inject 1.5 mg into the skin once a week. 6 mL 5   esomeprazole (NEXIUM) 20 MG capsule Take 1 capsule (20 mg total) by mouth daily before breakfast. (Patient taking differently: Take 20 mg by mouth 2 (two) times daily as needed (for heartburn).) 30 capsule 11   glucose blood test strip Use as instructed 100 each 12   lisinopril (ZESTRIL) 5 MG tablet Take 1 tablet (5 mg total) by mouth daily. 90 tablet 3   loratadine (CLARITIN) 10 MG tablet Take 10 mg by mouth daily as needed for allergies.     meloxicam (MOBIC) 15 MG tablet Take 1 tablet (15 mg total) by mouth daily. 30 tablet 0   metFORMIN (GLUCOPHAGE) 1000 MG tablet TAKE 1 TABLET BY MOUTH EVERY DAY WITH BREAKFAST 90 tablet 1   methocarbamol (ROBAXIN) 500 MG tablet Take 1,000 mg by mouth 2 (two) times daily.     Minoxidil (ROGAINE MENS EXTRA STRENGTH) 5 % FOAM See admin instructions.     naproxen (NAPROSYN) 500 MG tablet Take 1 tablet (500 mg total) by mouth 2 (two) times daily. 14 tablet 0   nitroGLYCERIN (NITRODUR - DOSED IN MG/24 HR) 0.1 mg/hr patch Place onto the  skin.     ondansetron (ZOFRAN) 4 MG tablet Take 1 tablet (4 mg total) by mouth every 8 (eight) hours as needed for nausea or vomiting. 20 tablet 0   oxyCODONE (ROXICODONE) 5 MG immediate release tablet Take 1 tablet (5 mg total) by mouth every 4 (four) hours as needed for severe pain. (Patient not taking: Reported on 02/08/2022) 10 tablet 0   promethazine (PHENERGAN) 12.5 MG tablet Take 1-2 tablets (12.5-25 mg total) by mouth every 6 (six) hours as needed for nausea or vomiting. 30 tablet 0   Pseudoephedrine-guaiFENesin (MUCINEX D MAX STRENGTH PO) Take by mouth. (Patient not taking: Reported on 02/08/2022)     Rimegepant Sulfate (NURTEC) 75 MG TBDP Take 1 tablet by mouth daily as needed. 10 tablet 5   rosuvastatin (CRESTOR) 40 MG tablet Take 1 tablet (40 mg total) by mouth daily. 90 tablet 3   selenium sulfide (SELSUN BLUE) 1 % LOTN  1 application to wet scalp (Patient not taking: Reported on 02/08/2022)     sulfamethoxazole-trimethoprim (BACTRIM DS) 800-160 MG tablet Take 1 tablet by mouth 2 (two) times daily. 20 tablet 0   traMADol (ULTRAM) 50 MG tablet Take 50 mg by mouth every 6 (six) hours as needed.     triamcinolone cream (KENALOG) 0.1 % SMARTSIG:1 Application Topical 2-3 Times Daily     TURMERIC PO Take 1 tablet by mouth daily. (Patient not taking: Reported on 02/08/2022)     venlafaxine (EFFEXOR) 37.5 MG tablet Take 37.5 mg by mouth at bedtime.      No current facility-administered medications on file prior to visit.    ALLERGIES: Allergies  Allergen Reactions   Mushroom Extract Complex Anaphylaxis   Peach Flavor Anaphylaxis   Adhesive [Tape] Hives    Paper tape only   Penicillins Hives, Rash and Other (See Comments)    Tingling and shaking PATIENT HAS HAD A PCN REACTION WITH IMMEDIATE RASH, FACIAL/TONGUE/THROAT SWELLING, SOB, OR LIGHTHEADEDNESS WITH HYPOTENSION:  #  #  YES  #  #  HAS PT DEVELOPED SEVERE RASH INVOLVING MUCUS MEMBRANES or SKIN NECROSIS: #  #  YES  #  #  Has patient had a PCN reaction that required hospitalization No Has patient had a PCN reaction occurring within the last 10 years: #  #  #  YES  #  #  #     Latex Itching    FAMILY HISTORY: Family History  Problem Relation Age of Onset   Arthritis Mother    Diabetes Mother    Heart disease Mother    Hypertension Mother    Kidney disease Mother    Stroke Mother    Gallbladder disease Mother    Glaucoma Mother    Seizures Mother    Migraines Mother    Arthritis Father    Diabetes Father    Hypertension Father    Kidney disease Father    Gallbladder disease Father    Migraines Sister    Migraines Brother    Healthy Son     Objective:  Blood pressure 123/83, pulse 76, height 5\' 6"  (1.676 m), weight 214 lb (97.1 kg), SpO2 98 %.  General: No acute distress.  Patient appears well-groomed.   Head:  Normocephalic/atraumatic Eyes:   fundi examined but not visualized Neck: supple, no paraspinal tenderness, full range of motion Back: No paraspinal tenderness Heart: regular rate and rhythm Lungs: Clear to auscultation bilaterally. Vascular: No carotid bruits. Neurological Exam: Mental status: alert and oriented to person, place, and  time, speech fluent and not dysarthric, language intact. Cranial nerves: CN I: not tested CN II: pupils equal, round and reactive to light, visual fields intact CN III, IV, VI:  full range of motion, no nystagmus, no ptosis CN V: facial sensation intact. CN VII: upper and lower face symmetric CN VIII: hearing intact CN IX, X: gag intact, uvula midline CN XI: sternocleidomastoid and trapezius muscles intact CN XII: tongue midline Bulk & Tone: normal, no fasciculations. Motor:  muscle strength 5/5 throughout Sensation:  Pinprick, temperature and vibratory sensation intact. Deep Tendon Reflexes:  2+ throughout,  toes downgoing.   Finger to nose testing:  Without dysmetria.   Heel to shin:  Without dysmetria.   Gait:  Normal station and stride.  Romberg negative.    Thank you for allowing me to take part in the care of this patient.  Shon Millet, DO  CC:  Sharlot Gowda, MD  Burnard Hawthorne, PA-C

## 2022-03-26 ENCOUNTER — Ambulatory Visit (INDEPENDENT_AMBULATORY_CARE_PROVIDER_SITE_OTHER): Payer: BC Managed Care – PPO | Admitting: Sports Medicine

## 2022-03-26 ENCOUNTER — Other Ambulatory Visit: Payer: Self-pay

## 2022-03-26 ENCOUNTER — Ambulatory Visit: Payer: BC Managed Care – PPO | Admitting: Neurology

## 2022-03-26 ENCOUNTER — Encounter: Payer: Self-pay | Admitting: Neurology

## 2022-03-26 VITALS — BP 122/83 | HR 68 | Ht 66.0 in | Wt 213.0 lb

## 2022-03-26 VITALS — BP 123/83 | HR 76 | Ht 66.0 in | Wt 214.0 lb

## 2022-03-26 DIAGNOSIS — M9908 Segmental and somatic dysfunction of rib cage: Secondary | ICD-10-CM

## 2022-03-26 DIAGNOSIS — G43101 Migraine with aura, not intractable, with status migrainosus: Secondary | ICD-10-CM

## 2022-03-26 DIAGNOSIS — M9903 Segmental and somatic dysfunction of lumbar region: Secondary | ICD-10-CM

## 2022-03-26 DIAGNOSIS — M9902 Segmental and somatic dysfunction of thoracic region: Secondary | ICD-10-CM

## 2022-03-26 NOTE — Patient Instructions (Signed)
  If you wake up with a migraine or if headache persists 3 hours after taking Nurtec, use the Trudhesa nasal spray - prime it with 4 pumps then 1 spray in each nostril.  May repeat after 1 hour (2 doses within 24 hours).  Let me know if you would like a prescription. Limit use of pain relievers to no more than 2 days out of the week.  These medications include acetaminophen, NSAIDs (ibuprofen/Advil/Motrin, naproxen/Aleve, triptans (Imitrex/sumatriptan), Excedrin, and narcotics.  This will help reduce risk of rebound headaches. Be aware of common food triggers:  - Caffeine:  coffee, black tea, cola, Mt. Dew  - Chocolate  - Dairy:  aged cheeses (brie, blue, cheddar, gouda, [place], provolone, [place], Swiss, etc), chocolate milk, buttermilk, sour cream, limit eggs and yogurt  - Nuts, peanut butter  - Alcohol  - Cereals/grains:  FRESH breads (fresh bagels, sourdough, doughnuts), yeast productions  - Processed/canned/aged/cured meats (pre-packaged deli meats, hotdogs)  - MSG/glutamate:  soy sauce, flavor enhancer, pickled/preserved/marinated foods  - Sweeteners:  aspartame (Equal, Nutrasweet).  Sugar and Splenda are okay  - Vegetables:  legumes (lima beans, lentils, snow peas, fava beans, pinto peans, peas, garbanzo beans), sauerkraut, onions, olives, pickles  - Fruit:  avocados, bananas, citrus fruit (orange, lemon, grapefruit), mango  - Other:  Frozen meals, macaroni and cheese Routine exercise Stay adequately hydrated (aim for 64 oz water daily) Keep headache diary Maintain proper stress management Maintain proper sleep hygiene Do not skip meals Consider supplements:  magnesium citrate 400mg  daily, riboflavin 400mg  daily, coenzyme Q10 100mg  three times daily.

## 2022-03-26 NOTE — Progress Notes (Signed)
Medication Samples have been provided to the patient.  Drug name: Brizza       Strength: 0.725mg         Qty: 2  LOT: [id number]  Exp.Date: 08/02/22  Dosing instructions: If you wake up with a migraine or if headache persists 3 hours after taking Nurtec, use the Trudhesa nasal spray - prime it with 4 pumps then 1 spray in each nostril.  May repeat after 1 hour (2 doses within 24 hours).   The patient has been instructed regarding the correct time, dose, and frequency of taking this medication, including desired effects and most common side effects.   Lenise Herald 3:45 PM 03/26/2022

## 2022-03-26 NOTE — Patient Instructions (Addendum)
Good to see you  Discontinue daily meloxicam use remainder as needed  As needed follow up

## 2022-04-30 ENCOUNTER — Ambulatory Visit (INDEPENDENT_AMBULATORY_CARE_PROVIDER_SITE_OTHER): Payer: BC Managed Care – PPO | Admitting: Family Medicine

## 2022-04-30 ENCOUNTER — Encounter: Payer: Self-pay | Admitting: Family Medicine

## 2022-04-30 VITALS — BP 130/76 | HR 70 | Temp 96.8°F | Ht 65.0 in | Wt 206.0 lb

## 2022-04-30 DIAGNOSIS — K219 Gastro-esophageal reflux disease without esophagitis: Secondary | ICD-10-CM

## 2022-04-30 DIAGNOSIS — E785 Hyperlipidemia, unspecified: Secondary | ICD-10-CM

## 2022-04-30 DIAGNOSIS — Z23 Encounter for immunization: Secondary | ICD-10-CM | POA: Diagnosis not present

## 2022-04-30 DIAGNOSIS — E669 Obesity, unspecified: Secondary | ICD-10-CM

## 2022-04-30 DIAGNOSIS — G4730 Sleep apnea, unspecified: Secondary | ICD-10-CM

## 2022-04-30 DIAGNOSIS — Z0000 Encounter for general adult medical examination without abnormal findings: Secondary | ICD-10-CM | POA: Diagnosis not present

## 2022-04-30 DIAGNOSIS — J301 Allergic rhinitis due to pollen: Secondary | ICD-10-CM

## 2022-04-30 DIAGNOSIS — E119 Type 2 diabetes mellitus without complications: Secondary | ICD-10-CM

## 2022-04-30 DIAGNOSIS — G43009 Migraine without aura, not intractable, without status migrainosus: Secondary | ICD-10-CM | POA: Diagnosis not present

## 2022-04-30 DIAGNOSIS — Z96652 Presence of left artificial knee joint: Secondary | ICD-10-CM

## 2022-04-30 DIAGNOSIS — E1169 Type 2 diabetes mellitus with other specified complication: Secondary | ICD-10-CM

## 2022-04-30 DIAGNOSIS — M1990 Unspecified osteoarthritis, unspecified site: Secondary | ICD-10-CM

## 2022-04-30 DIAGNOSIS — Z96651 Presence of right artificial knee joint: Secondary | ICD-10-CM

## 2022-04-30 DIAGNOSIS — J45990 Exercise induced bronchospasm: Secondary | ICD-10-CM

## 2022-04-30 LAB — POCT GLYCOSYLATED HEMOGLOBIN (HGB A1C): Hemoglobin A1C: 8.3 % — AB (ref 4.0–5.6)

## 2022-04-30 LAB — POCT UA - MICROALBUMIN
Albumin/Creatinine Ratio, Urine, POC: 6.5
Creatinine, POC: 340.8 mg/dL
Microalbumin Ur, POC: 22 mg/L

## 2022-04-30 MED ORDER — TRULICITY 3 MG/0.5ML ~~LOC~~ SOAJ: 3.0000 mg | SUBCUTANEOUS | 2 refills | Status: DC

## 2022-04-30 MED ORDER — DAPAGLIFLOZIN PROPANEDIOL 10 MG PO TABS: 10.0000 mg | ORAL_TABLET | Freq: Every day | ORAL | 1 refills | Status: DC

## 2022-04-30 MED ORDER — ROSUVASTATIN CALCIUM 40 MG PO TABS: 40.0000 mg | ORAL_TABLET | Freq: Every day | ORAL | 3 refills | Status: DC

## 2022-04-30 MED ORDER — NURTEC 75 MG PO TBDP: 1.0000 | ORAL_TABLET | Freq: Every day | ORAL | 5 refills | Status: DC | PRN

## 2022-04-30 MED ORDER — LISINOPRIL 5 MG PO TABS
5.0000 mg | ORAL_TABLET | Freq: Every day | ORAL | 3 refills | Status: DC
Start: 2022-04-30 — End: 2022-08-10

## 2022-04-30 MED ORDER — METFORMIN HCL 1000 MG PO TABS: ORAL_TABLET | ORAL | 1 refills | Status: DC

## 2022-04-30 NOTE — Patient Instructions (Signed)

## 2022-04-30 NOTE — Progress Notes (Signed)
Complete physical exam  Patient: Karen Hicks   DOB: Dec 03, 1970   50 y.o. Female  MRN: [id number]  Subjective:    Chief Complaint  Patient presents with   Annual Exam    Fasting     Karen Hicks is a 51 y.o. female who presents today for a complete physical exam. She reports consuming a diabetic, 1500 calorie diet. Home exercise routine includes walking 6 hrs per week. She generally feels well. She reports sleeping well. She does have difficulty with bilateral foot pain that she describes as cramping over the last several weeks.  No particular inciting events.  She continues on Farxiga as well as metformin and Trulicity.  She is having no difficulty with them.  She is using Nexium on an as-needed basis for her reflux.  She does have OSA but apparently is not at a high enough level to need CPAP.  Her migraines seem to be under good control with occasional use of Nurtec.  Exercise-induced asthma is present but rarely causes trouble.  She does have albuterol.  Allergies seem to be under good control.  She has had bilateral TKR and seem to be doing well with this.  She now has a new job and is living in [place] but still plans to come back here.  She has had Pap with HPV present.  She has had a colposcopy.  Mammogram was done.  Colonoscopy was done.  Otherwise her family and social history as well as health maintenance and immunizations was evaluated.   Most recent fall risk assessment:    03/26/2022    2:46 PM  Fall Risk   Falls in the past year? 0  Number falls in past yr: 0  Injury with Fall? 0     Most recent depression screenings:    04/30/2022    8:40 AM 04/20/2021   11:05 AM  PHQ 2/9 Scores  PHQ - 2 Score 0 0      Patient Active Problem List   Diagnosis Date Noted   Hot flashes 04/20/2021   Exercise-induced asthma 04/20/2021   Hyperlipidemia associated with type 2 diabetes mellitus (HCC) 10/06/2019   Type 2 diabetes mellitus without complications  (HCC) 02/21/2018   Chest pain 10/25/2017   S/P TKR (total knee replacement), right 07/31/2016   Status post total left knee replacement 12/14/2014   Arthritis 07/21/2014   GERD (gastroesophageal reflux disease) 06/12/2011   Sleep apnea 06/12/2011   Obesity (BMI 30-39.9) 06/12/2011   Allergic rhinitis due to pollen 06/12/2011   Migraine headache 06/12/2011   Past Medical History:  Diagnosis Date   Allergy    Anemia    when she was young   Arthritis    Complication of anesthesia    takes her alittle longer to wake up   GERD (gastroesophageal reflux disease)    Irritable bowel    Lactose intolerance    Migraine    PE (pulmonary embolism)    in 1994 meniscus repair.  Saw Dr. Susann Givens   Pneumonia    PONV (postoperative nausea and vomiting)    Pre-diabetes    Restless legs syndrome    Sleep apnea    2007.  Uses the mask & machine when she needs it.  Dr. 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  07/19/2014   right wrist arthroscopy  2009   TOTAL ABDOMINAL HYSTERECTOMY  01/2006   FIBROIDS   TOTAL KNEE ARTHROPLASTY Left 08/03/2014   dr Jerl Santos   TOTAL KNEE ARTHROPLASTY Left 08/03/2014   Procedure: LEFT TOTAL KNEE ARTHROPLASTY;  Surgeon: Velna Ochs, MD;  Location: [facility] OR;  Service: Orthopedics;  Laterality: Left;   TOTAL KNEE ARTHROPLASTY Right 07/31/2016   Procedure: TOTAL KNEE ARTHROPLASTY;  Surgeon: Marcene Corning, MD;  Location: [facility] OR;  Service: Orthopedics;   Laterality: Right;   UPPER GI ENDOSCOPY  02/18/2020   Social History   Tobacco Use   Smoking status: Never    Passive exposure: Never   Smokeless tobacco: Never  Vaping Use   Vaping Use: Never used  Substance Use Topics   Alcohol use: Not Currently    Comment: "with a good steak"   Drug use: No   Family History  Problem Relation Age of Onset   Arthritis Mother    Diabetes Mother    Heart disease Mother    Hypertension Mother    Kidney disease Mother    Stroke Mother    Gallbladder disease Mother    Glaucoma Mother    Seizures Mother    Migraines Mother    Arthritis Father    Diabetes Father    Hypertension Father    Kidney disease Father    Gallbladder disease Father    Migraines Sister    Migraines Brother    Healthy Son    Allergies  Allergen Reactions   Mushroom Extract Complex Anaphylaxis   Peach Flavor Anaphylaxis   Adhesive [Tape] Hives    Paper tape only   Penicillins Hives, Rash and Other (See Comments)    Tingling and shaking PATIENT HAS HAD A PCN REACTION WITH IMMEDIATE RASH, FACIAL/TONGUE/THROAT SWELLING, SOB, OR LIGHTHEADEDNESS WITH HYPOTENSION:  #  #  YES  #  #  HAS PT DEVELOPED SEVERE RASH INVOLVING MUCUS MEMBRANES or SKIN NECROSIS: #  #  YES  #  #  Has patient had a PCN reaction that required hospitalization No Has patient had a PCN reaction occurring within the last 10 years: #  #  #  YES  #  #  #     Latex Itching      Patient Care Team: Ronnald Nian, MD as PCP - General (Family Medicine) Quintella Reichert, MD as PCP - Cardiology (Cardiology)   Outpatient Medications Prior to Visit  Medication Sig Note   albuterol (VENTOLIN HFA) 108 (90 Base) MCG/ACT inhaler Inhale 2 puffs into the lungs every 6 (six) hours as needed for wheezing or shortness of breath. 04/30/2022: Prn last dose was a week ago   clobetasol (TEMOVATE) 0.05 % external solution Apply topically.    Colloidal Oatmeal (AVEENO ECZEMA THERAPY EX) Apply 1 application topically 2  (two) times daily.    diphenhydrAMINE (BENADRYL) 25 MG tablet Take 25 mg by mouth daily as needed for allergies. 04/30/2022: Prn last dose two weeks ago   esomeprazole (NEXIUM) 20 MG capsule Take 1 capsule (20 mg total) by mouth daily before breakfast. (Patient taking differently: Take 20 mg by mouth 2 (  two) times daily as needed (for heartburn).) 04/30/2022: Prn last dose a week ago   glucose blood test strip Use as instructed    loratadine (CLARITIN) 10 MG tablet Take 10 mg by mouth daily as needed for allergies. 04/30/2022: Prn last dose two weeks ago   Pseudoephedrine-guaiFENesin (MUCINEX D MAX STRENGTH PO) Take by mouth. 04/30/2022: Prn last week   triamcinolone cream (KENALOG) 0.1 % SMARTSIG:1 Application Topical 2-3 Times Daily    TURMERIC PO Take 1 tablet by mouth daily.    venlafaxine (EFFEXOR) 37.5 MG tablet Take 37.5 mg by mouth at bedtime.  04/30/2022: Prn last dose was yesterday   [DISCONTINUED] dapagliflozin propanediol (FARXIGA) 10 MG TABS tablet Take 1 tablet (10 mg total) by mouth daily before breakfast.    [DISCONTINUED] Dulaglutide (TRULICITY) 1.5 MG/0.5ML SOPN Inject 1.5 mg into the skin once a week.    [DISCONTINUED] lisinopril (ZESTRIL) 5 MG tablet Take 1 tablet (5 mg total) by mouth daily.    [DISCONTINUED] metFORMIN (GLUCOPHAGE) 1000 MG tablet TAKE 1 TABLET BY MOUTH EVERY DAY WITH BREAKFAST    [DISCONTINUED] Rimegepant Sulfate (NURTEC) 75 MG TBDP Take 1 tablet by mouth daily as needed. 04/30/2022: Prn last week    [DISCONTINUED] rosuvastatin (CRESTOR) 40 MG tablet Take 1 tablet (40 mg total) by mouth daily.    meloxicam (MOBIC) 15 MG tablet Take 1 tablet (15 mg total) by mouth daily. (Patient not taking: Reported on 04/30/2022)    methocarbamol (ROBAXIN) 500 MG tablet Take 1,000 mg by mouth 2 (two) times daily. (Patient not taking: Reported on 03/26/2022)    Minoxidil (ROGAINE MENS EXTRA STRENGTH) 5 % FOAM See admin instructions. (Patient not taking: Reported on 04/30/2022)     naproxen (NAPROSYN) 500 MG tablet Take 1 tablet (500 mg total) by mouth 2 (two) times daily. (Patient not taking: Reported on 03/26/2022)    nitroGLYCERIN (NITRODUR - DOSED IN MG/24 HR) 0.1 mg/hr patch Place onto the skin. (Patient not taking: Reported on 04/30/2022) 04/30/2022: Prn needs refill   ondansetron (ZOFRAN) 4 MG tablet Take 1 tablet (4 mg total) by mouth every 8 (eight) hours as needed for nausea or vomiting. (Patient not taking: Reported on 04/30/2022) 04/30/2022: Prn last dose three months ago   promethazine (PHENERGAN) 12.5 MG tablet Take 1-2 tablets (12.5-25 mg total) by mouth every 6 (six) hours as needed for nausea or vomiting. (Patient not taking: Reported on 04/30/2022)    selenium sulfide (SELSUN BLUE) 1 % LOTN  (Patient not taking: Reported on 04/30/2022) 04/30/2022: Prn last dose was last month    sulfamethoxazole-trimethoprim (BACTRIM DS) 800-160 MG tablet Take 1 tablet by mouth 2 (two) times daily. (Patient not taking: Reported on 03/26/2022)    traMADol (ULTRAM) 50 MG tablet Take 50 mg by mouth every 6 (six) hours as needed. (Patient not taking: Reported on 03/26/2022)    No facility-administered medications prior to visit.    Review of Systems  All other systems reviewed and are negative.         Objective:     BP 130/76   Pulse 70   Temp (!) 96.8 F (36 C)   Ht 5\' 5"  (1.651 m)   Wt 206 lb (93.4 kg)   SpO2 97%   BMI 34.28 kg/m  BP Readings from Last 3 Encounters:  04/30/22 130/76  03/26/22 123/83  03/26/22 122/83   Wt Readings from Last 3 Encounters:  04/30/22 206 lb (93.4 kg)  03/26/22 214 lb (97.1 kg)  03/26/22 213 lb (96.6  kg)      Physical Exam  Alert and in no distress. Tympanic membranes and canals are normal. Pharyngeal area is normal. Neck is supple without adenopathy or thyromegaly. Cardiac exam shows a regular sinus rhythm without murmurs or gallops. Lungs are clear to auscultation. Hemoglobin A1c is 8.3 Last CBC Lab Results  Component Value  Date   WBC 9.8 01/01/2022   HGB 13.8 01/01/2022   HCT 41.7 01/01/2022   MCV 85.3 01/01/2022   MCH 28.2 01/01/2022   RDW 14.2 01/01/2022   PLT 267 01/01/2022   Last metabolic panel Lab Results  Component Value Date   GLUCOSE 170 (H) 01/01/2022   NA 136 01/01/2022   K 4.0 01/01/2022   CL 102 01/01/2022   CO2 26 01/01/2022   BUN 14 01/01/2022   CREATININE 0.78 01/01/2022   GFRNONAA >60 01/01/2022   CALCIUM 9.1 01/01/2022   PROT 7.8 01/01/2022   ALBUMIN 4.0 01/01/2022   LABGLOB 2.6 04/20/2021   AGRATIO 1.8 04/20/2021   BILITOT 0.6 01/01/2022   ALKPHOS 80 01/01/2022   AST 37 01/01/2022   ALT 42 01/01/2022   ANIONGAP 8 01/01/2022   Last lipids Lab Results  Component Value Date   CHOL 222 (H) 10/06/2019   HDL 38 (L) 10/06/2019   LDLCALC 135 (H) 10/06/2019   TRIG 275 (H) 10/06/2019   CHOLHDL 5.8 (H) 10/06/2019   Last hemoglobin A1c Lab Results  Component Value Date   HGBA1C 7.0 (A) 04/20/2021   Last vitamin D No results found for: "25OHVITD2", "25OHVITD3", "VD25OH"      Assessment & Plan:    Routine general medical examination at a health care facility  Type 2 diabetes mellitus without complication, without long-term current use of insulin (HCC) - Plan: HgB A1c, POCT UA - Microalbumin, Dulaglutide (TRULICITY) 3 MG/0.5ML SOPN, metFORMIN (GLUCOPHAGE) 1000 MG tablet, dapagliflozin propanediol (FARXIGA) 10 MG TABS tablet  Migraine without aura and without status migrainosus, not intractable - Plan: Rimegepant Sulfate (NURTEC) 75 MG TBDP  Gastroesophageal reflux disease without esophagitis  Sleep apnea, unspecified type  Obesity (BMI 30-39.9)  S/P TKR (total knee replacement), right  Allergic rhinitis due to pollen, unspecified seasonality  Exercise-induced asthma  Hyperlipidemia associated with type 2 diabetes mellitus (HCC) - Plan: Lipid panel, rosuvastatin (CRESTOR) 40 MG tablet  Arthritis  Status post total left knee replacement  Need for influenza  vaccination - Plan: Flu Vaccine QUAD 28mo+IM (Fluarix, Fluzone & Alfiuria Quad PF)  Immunization History  Administered Date(s) Administered   Influenza,inj,Quad PF,6+ Mos 06/12/2018, 07/06/2019, 05/30/2020   Influenza-Unspecified 09/22/2021   PFIZER(Purple Top)SARS-COV-2 Vaccination 09/29/2019, 10/10/2019, 07/05/2020, 12/15/2020   PNEUMOCOCCAL CONJUGATE-20 04/20/2021   Tdap 05/30/2020    Health Maintenance  Topic Date Due   COVID-19 Vaccine (5 - Pfizer risk series) 02/09/2021   HEMOGLOBIN A1C  10/21/2021   INFLUENZA VACCINE  04/03/2022   Zoster Vaccines- Shingrix (1 of 2) 07/31/2022 (Originally 08/18/1990)   HIV Screening  05/01/2023 (Originally [birth year]18/1986)   OPHTHALMOLOGY EXAM  03/05/2023   FOOT EXAM  05/01/2023   MAMMOGRAM  02/29/2024   TETANUS/TDAP  05/30/2030   COLONOSCOPY (Pts 45-30yrs Insurance coverage will need to be confirmed)  02/03/2031   Hepatitis C Screening  Completed   HPV VACCINES  Aged Out    Discussed health benefits of physical activity, and encouraged her to engage in regular exercise appropriate for her age and condition.  Recommend she see a nutritionist in her area to help with dietary modification.  I will increase her  Trulicity to 3 mg.  Encourage physical activity.  Discussed wearing proper fitting shoes and good arch supports to help with her feet.  Her medications were renewed and transferred to a pharmacy in [place].  Recheck here in 4 months.  Problem List Items Addressed This Visit     Allergic rhinitis due to pollen   Arthritis   Exercise-induced asthma   GERD (gastroesophageal reflux disease)   Hyperlipidemia associated with type 2 diabetes mellitus (HCC)   Relevant Medications   Dulaglutide (TRULICITY) 3 MG/0.5ML SOPN   lisinopril (ZESTRIL) 5 MG tablet   metFORMIN (GLUCOPHAGE) 1000 MG tablet   rosuvastatin (CRESTOR) 40 MG tablet   dapagliflozin propanediol (FARXIGA) 10 MG TABS tablet   Other Relevant Orders   Lipid panel   Migraine  headache   Relevant Medications   lisinopril (ZESTRIL) 5 MG tablet   Rimegepant Sulfate (NURTEC) 75 MG TBDP   rosuvastatin (CRESTOR) 40 MG tablet   Obesity (BMI 30-39.9)   Relevant Medications   Dulaglutide (TRULICITY) 3 MG/0.5ML SOPN   metFORMIN (GLUCOPHAGE) 1000 MG tablet   dapagliflozin propanediol (FARXIGA) 10 MG TABS tablet   S/P TKR (total knee replacement), right   Sleep apnea   Status post total left knee replacement   Type 2 diabetes mellitus without complications (HCC)   Relevant Medications   Dulaglutide (TRULICITY) 3 MG/0.5ML SOPN   lisinopril (ZESTRIL) 5 MG tablet   metFORMIN (GLUCOPHAGE) 1000 MG tablet   rosuvastatin (CRESTOR) 40 MG tablet   dapagliflozin propanediol (FARXIGA) 10 MG TABS tablet   Other Relevant Orders   HgB A1c   POCT UA - Microalbumin   Other Visit Diagnoses     Routine general medical examination at a health care facility    -  Primary   Need for influenza vaccination       Relevant Orders   Flu Vaccine QUAD 50mo+IM (Fluarix, Fluzone & Alfiuria Quad PF)      Return in about 4 months (around 08/30/2022) for for cpe and diabetes follow up.     Sharlot Gowda, MD

## 2022-06-12 ENCOUNTER — Encounter: Payer: Self-pay | Admitting: Internal Medicine

## 2022-06-25 ENCOUNTER — Encounter: Payer: Self-pay | Admitting: Internal Medicine

## 2022-08-10 ENCOUNTER — Other Ambulatory Visit: Payer: Self-pay

## 2022-08-10 ENCOUNTER — Telehealth: Payer: Self-pay | Admitting: Family Medicine

## 2022-08-10 DIAGNOSIS — E119 Type 2 diabetes mellitus without complications: Secondary | ICD-10-CM

## 2022-08-10 MED ORDER — LISINOPRIL 5 MG PO TABS: 5.0000 mg | ORAL_TABLET | Freq: Every day | ORAL | 1 refills | Status: DC

## 2022-08-10 MED ORDER — TRULICITY 3 MG/0.5ML ~~LOC~~ SOAJ: 3.0000 mg | SUBCUTANEOUS | 2 refills | Status: DC

## 2022-08-10 MED ORDER — METFORMIN HCL 1000 MG PO TABS: ORAL_TABLET | ORAL | 1 refills | Status: DC

## 2022-08-10 NOTE — Telephone Encounter (Signed)
Needs refills on  Trulicity lisinopril Metformin

## 2022-08-13 ENCOUNTER — Encounter: Payer: Self-pay | Admitting: Family Medicine

## 2022-08-13 ENCOUNTER — Other Ambulatory Visit: Payer: Self-pay | Admitting: *Deleted

## 2022-08-13 ENCOUNTER — Other Ambulatory Visit (INDEPENDENT_AMBULATORY_CARE_PROVIDER_SITE_OTHER): Payer: BC Managed Care – PPO

## 2022-08-13 ENCOUNTER — Telehealth: Payer: BC Managed Care – PPO | Admitting: Family Medicine

## 2022-08-13 VITALS — Temp 98.5°F | Ht 65.0 in | Wt 208.0 lb

## 2022-08-13 DIAGNOSIS — R0989 Other specified symptoms and signs involving the circulatory and respiratory systems: Secondary | ICD-10-CM

## 2022-08-13 DIAGNOSIS — R059 Cough, unspecified: Secondary | ICD-10-CM

## 2022-08-13 DIAGNOSIS — J069 Acute upper respiratory infection, unspecified: Secondary | ICD-10-CM

## 2022-08-13 LAB — POC COVID19 BINAXNOW: SARS Coronavirus 2 Ag: NEGATIVE

## 2022-08-13 LAB — POCT INFLUENZA A/B
Influenza A, POC: NEGATIVE
Influenza B, POC: NEGATIVE

## 2022-08-13 NOTE — Progress Notes (Signed)
Start time: 11:55 End time: 12:10  Virtual Visit via Video Note  I connected with Karen Hicks on 08/13/22 by a video enabled telemedicine application and verified that I am speaking with the correct person using two identifiers.  Location: Patient: home Provider: office   I discussed the limitations of evaluation and management by telemedicine and the availability of in person appointments. The patient expressed understanding and agreed to proceed.  History of Present Illness:  Chief Complaint  Patient presents with   Cough    VIRTUAL cough and congestion, stuffy nose, sinus pressure-mucus is green in color. Cough is productive. Has fever, chills and body aches. 99.5 on Friday. Symptoms started Friday evening. Did home covid test on Sat and it was negative. Yesterday she started having nose bleeds.    Works in [place] through [employer], returned to town Friday evening. 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 Mid-day on Friday she started with some hot/cold flashes, then started with a productive cough. Mucus is thick, green.  Having some nosebleeds since yesterday, and notices some blood in the phlegm. +sinus drainage, clear.  Some pressure/pain between her eyes. Scratchy throat/voice, but ST from Friday has resolved. +body aches--lower back, started 2 days ago. Feels sluggish, fatigued. Using albuterol every 4-6 hours since Friday.  It has helped, things loosened up some, not as tight.  Last dose 6:30 this morning. Denies shortness of breath.  No other sick contacts.  Using Dayquil and Nyquil. She ran out of Coricidin. Never had COVID    PMH, PSH, SH reviewed  DM. Pt reports h/o HTN (on low dose ACEI)  Outpatient Encounter Medications as of 08/13/2022  Medication Sig Note   albuterol (VENTOLIN HFA) 108 (90 Base) MCG/ACT inhaler Inhale 2 puffs into the lungs every 6 (six) hours as needed  for wheezing or shortness of breath. 08/13/2022: Used today   clobetasol (TEMOVATE) 0.05 % external solution Apply topically.    Colloidal Oatmeal (AVEENO ECZEMA THERAPY EX) Apply 1 application topically 2 (two) times daily.    dapagliflozin propanediol (FARXIGA) 10 MG TABS tablet Take 1 tablet (10 mg total) by mouth daily before breakfast.    Dulaglutide (TRULICITY) 3 MG/0.5ML SOPN Inject 3 mg as directed once a week. 08/13/2022: On Sundays   esomeprazole (NEXIUM) 20 MG capsule Take 1 capsule (20 mg total) by mouth daily before breakfast. (Patient taking differently: Take 20 mg by mouth 2 (two) times daily as needed (for heartburn).) 08/13/2022: Every other day   glucose blood test strip Use as instructed    lisinopril (ZESTRIL) 5 MG tablet Take 1 tablet (5 mg total) by mouth daily.    loratadine (CLARITIN) 10 MG tablet Take 10 mg by mouth daily as needed for allergies.    metFORMIN (GLUCOPHAGE) 1000 MG tablet TAKE 1 TABLET BY MOUTH EVERY DAY WITH BREAKFAST    Pseudoeph-Doxylamine-DM-APAP (NYQUIL PO) Take 2 capsules by mouth as needed. 08/13/2022: Last dose last night   Pseudoephedrine-APAP-DM (DAYQUIL PO) Take 2 capsules by mouth as needed. 08/13/2022: Last dose yesterday   rosuvastatin (CRESTOR) 40 MG tablet Take 1 tablet (40 mg total) by mouth daily.    selenium sulfide (SELSUN BLUE) 1 % LOTN  08/13/2022: Once weekly   triamcinolone cream (KENALOG) 0.1 % SMARTSIG:1 Application Topical 2-3 Times Daily    venlafaxine (EFFEXOR) 37.5 MG tablet Take 37.5 mg by mouth at bedtime.     [DISCONTINUED] TURMERIC PO Take 1 tablet by mouth  daily.    diphenhydrAMINE (BENADRYL) 25 MG tablet Take 25 mg by mouth daily as needed for allergies. (Patient not taking: Reported on 08/13/2022) 08/13/2022: prn   Minoxidil (ROGAINE MENS EXTRA STRENGTH) 5 % FOAM See admin instructions. (Patient not taking: Reported on 04/30/2022) 08/13/2022: prn   nitroGLYCERIN (NITRODUR - DOSED IN MG/24 HR) 0.1 mg/hr patch Place onto the  skin. (Patient not taking: Reported on 04/30/2022)    ondansetron (ZOFRAN) 4 MG tablet Take 1 tablet (4 mg total) by mouth every 8 (eight) hours as needed for nausea or vomiting. (Patient not taking: Reported on 04/30/2022) 08/13/2022: prn   promethazine (PHENERGAN) 12.5 MG tablet Take 1-2 tablets (12.5-25 mg total) by mouth every 6 (six) hours as needed for nausea or vomiting. (Patient not taking: Reported on 04/30/2022) 08/13/2022: prn   Rimegepant Sulfate (NURTEC) 75 MG TBDP Take 1 tablet by mouth daily as needed. (Patient not taking: Reported on 08/13/2022) 08/13/2022: prn   [DISCONTINUED] meloxicam (MOBIC) 15 MG tablet Take 1 tablet (15 mg total) by mouth daily. (Patient not taking: Reported on 04/30/2022)    [DISCONTINUED] methocarbamol (ROBAXIN) 500 MG tablet Take 1,000 mg by mouth 2 (two) times daily. (Patient not taking: Reported on 03/26/2022)    [DISCONTINUED] naproxen (NAPROSYN) 500 MG tablet Take 1 tablet (500 mg total) by mouth 2 (two) times daily. (Patient not taking: Reported on 03/26/2022)    [DISCONTINUED] Pseudoephedrine-guaiFENesin (MUCINEX D MAX STRENGTH PO) Take by mouth. 04/30/2022: Prn last week   [DISCONTINUED] sulfamethoxazole-trimethoprim (BACTRIM DS) 800-160 MG tablet Take 1 tablet by mouth 2 (two) times daily. (Patient not taking: Reported on 03/26/2022)    [DISCONTINUED] traMADol (ULTRAM) 50 MG tablet Take 50 mg by mouth every 6 (six) hours as needed. (Patient not taking: Reported on 03/26/2022)    No facility-administered encounter medications on file as of 08/13/2022.   Allergies  Allergen Reactions   Mushroom Extract Complex Anaphylaxis   Peach Flavor Anaphylaxis   Adhesive [Tape] Hives    Paper tape only   Penicillins Hives, Rash and Other (See Comments)    Tingling and shaking PATIENT HAS HAD A PCN REACTION WITH IMMEDIATE RASH, FACIAL/TONGUE/THROAT SWELLING, SOB, OR LIGHTHEADEDNESS WITH HYPOTENSION:  #  #  YES  #  #  HAS PT DEVELOPED SEVERE RASH INVOLVING MUCUS  MEMBRANES or SKIN NECROSIS: #  #  YES  #  #  Has patient had a PCN reaction that required hospitalization No Has patient had a PCN reaction occurring within the last 10 years: #  #  #  YES  #  #  #     Latex Itching   ROS:  per HPI    Observations/Objective:  Temp 98.5 F (36.9 C) (Temporal)   Ht 5\' 5"  (1.651 m)   Wt 208 lb (94.3 kg)   BMI 34.61 kg/m   Well-appearing female in no distress. No significant coughing during visit. She is alert and oriented, speaking comfortably. Exam is limited due to virtual nature of the visit.  Rapid COVID negative Influenza A&B negative   Assessment and Plan:  Viral upper respiratory illness - supportive measures reviewed.  s/sx bacterial infection and for which she should f/u were discussed   Your flu and COVID tests were negative.  Drink plenty of water. Stop the dayquil and nyquil which can raise your blood pressure. Get some more Coricidin HBP. Get a 12 hour PLAIN Mucinex (not D or DM), and take this twice daily.  This is an expectorant that should up loosen  up the thick mucus/phlegm. The coricidin should have dextromethorphan in it (which is a cough suppressant, so don't get the DM version of Mucinex).  Return for re-evaluation if you have recurrent fever, persistently discolored mucus or phlegm, shortness of breath, pain with breathing, or other concerns.    Follow Up Instructions:    I discussed the assessment and treatment plan with the patient. The patient was provided an opportunity to ask questions and all were answered. The patient agreed with the plan and demonstrated an understanding of the instructions.   The patient was advised to call back or seek an in-person evaluation if the symptoms worsen or if the condition fails to improve as anticipated.  I spent 20 minutes dedicated to the care of this patient, including pre-visit review of records, face to face time, post-visit ordering of testing and  documentation.    Lavonda Jumbo, MD

## 2022-08-13 NOTE — Patient Instructions (Addendum)
Your flu and COVID tests were negative.  Drink plenty of water. Stop the dayquil and nyquil which can raise your blood pressure. Get some more Coricidin HBP. Get a 12 hour PLAIN Mucinex (not D or DM), and take this twice daily.  This is an expectorant that should up loosen up the thick mucus/phlegm. The coricidin should have dextromethorphan in it (which is a cough suppressant, so don't get the DM version of Mucinex).  Return for re-evaluation if you have recurrent fever, persistently discolored mucus or phlegm, shortness of breath, pain with breathing, or other concerns.

## 2022-08-14 ENCOUNTER — Encounter: Payer: Self-pay | Admitting: Family Medicine

## 2022-08-31 ENCOUNTER — Encounter: Payer: Self-pay | Admitting: Family Medicine

## 2022-08-31 ENCOUNTER — Ambulatory Visit (INDEPENDENT_AMBULATORY_CARE_PROVIDER_SITE_OTHER): Payer: BC Managed Care – PPO | Admitting: Family Medicine

## 2022-08-31 VITALS — BP 144/90 | HR 69 | Wt 206.0 lb

## 2022-08-31 DIAGNOSIS — G4730 Sleep apnea, unspecified: Secondary | ICD-10-CM

## 2022-08-31 DIAGNOSIS — E119 Type 2 diabetes mellitus without complications: Secondary | ICD-10-CM

## 2022-08-31 DIAGNOSIS — E669 Obesity, unspecified: Secondary | ICD-10-CM

## 2022-08-31 DIAGNOSIS — M1990 Unspecified osteoarthritis, unspecified site: Secondary | ICD-10-CM

## 2022-08-31 DIAGNOSIS — E1169 Type 2 diabetes mellitus with other specified complication: Secondary | ICD-10-CM | POA: Diagnosis not present

## 2022-08-31 DIAGNOSIS — E785 Hyperlipidemia, unspecified: Secondary | ICD-10-CM

## 2022-08-31 LAB — POCT GLYCOSYLATED HEMOGLOBIN (HGB A1C): Hemoglobin A1C: 8.2 % — AB (ref 4.0–5.6)

## 2022-08-31 MED ORDER — TIRZEPATIDE 10 MG/0.5ML ~~LOC~~ SOAJ: 10.0000 mg | SUBCUTANEOUS | 1 refills | Status: DC

## 2022-08-31 NOTE — Progress Notes (Signed)
Subjective:    Patient ID: Karen Hicks, female    DOB: [birth year], 51 y.o.   MRN: [id number]  Karen Hicks is a 51 y.o. female who presents for follow-up of Type 2 diabetes mellitus.  Home blood sugar records: fasting range: Low 100 Current symptoms/problems include none and have been unchanged. Daily foot checks:   Any foot concerns: no How often blood sugars checked: Exercise: The patient does not participate in regular exercise at present. Diet: Regular. She continues on Farxiga, Trulicity and metformin.  She has seen dietitian in the past.  She does exercise regularly.  She is having some difficulty with arthritis mainly in her hands but tends to go away relatively quickly later on in the morning.  She does have OSA and presently is not on CPAP however she was told that she is now having more apneic episodes.  Hemoglobin A1c is 8.2 The following portions of the patient's history were reviewed and updated as appropriate: allergies, current medications, past medical history, past social history and problem list.  ROS as in subjective above.     Objective:    Physical Exam Alert and in no distress otherwise not examined. Hemoglobin A1c is 8.2 Lab Review    Latest Ref Rng & Units 04/30/2022   10:03 AM 01/01/2022    8:59 PM 08/12/2021    4:34 AM 04/20/2021   12:08 PM 04/20/2021   12:06 PM  Diabetic Labs  HbA1c 4.0 - 5.6 % 8.3    7.0    Microalbumin mg/L 22.0       Micro/Creat Ratio  6.5       Creatinine 0.44 - 1.00 mg/dL  3.23  5.57   3.22       08/13/2022   11:09 AM 04/30/2022    8:42 AM 03/26/2022    2:43 PM 03/26/2022    8:17 AM 03/09/2022    2:39 PM  BP/Weight  Systolic BP  130 025 122 144  Diastolic BP  76 83 83 84  Wt. (Lbs) 208 206 214 213 212  BMI 34.61 kg/m2 34.28 kg/m2 34.54 kg/m2 34.38 kg/m2 34.22 kg/m2      Latest Ref Rng & Units 04/30/2022    8:30 AM 03/04/2022   12:00 AM  Foot/eye exam completion dates  Eye Exam No Retinopathy  No  Retinopathy      Foot Form Completion  Done      This result is from an external source.    Karen Hicks  reports that she has never smoked. She has never been exposed to tobacco smoke. She has never used smokeless tobacco. She reports that she does not currently use alcohol. She reports that she does not use drugs.     Assessment & Plan:   Hyperlipidemia associated with type 2 diabetes mellitus (HCC)  Type 2 diabetes mellitus without complication, without long-term current use of insulin (HCC) - Plan: POCT glycosylated hemoglobin (Hb A1C)  Obesity (BMI 30-39.9)  Sleep apnea, unspecified type - Plan: Home sleep test  I think it is important to repeat calibrate her in terms of sleep apnea since she is having more difficulty especially with fatigue. We have made very little progress in terms of diabetes as well as weight loss and I think it is worthwhile trying to see if we can get her to qualify for [facility] to get better help with both the diet and exercise.  Recommend supportive care for the arthritis type symptoms mainly using Tylenol.

## 2022-09-27 NOTE — Progress Notes (Signed)
NEUROLOGY FOLLOW UP OFFICE NOTE  Karen Hicks [id number]  Assessment/Plan:   Migraine with aura, without status migrainosus, not intractable   Nurtec still for first line abortive therapy She will try sumatriptan 6mg  Lyons Switch as an abortive when she wakes up with a migraine or as second line to Nurtec if migraine persists for 3 hours.   Limit use of pain relievers to no more than 2 days out of week to prevent risk of rebound or medication-overuse headache. Keep headache diary Follow up 6 months.       Subjective:  Karen Hicks is a 52 year old female with sleep apnea, arthritis, fatty liver, and history of PE (1994 related to surgery) who follows up for migraines.  UPDATE: Nurtec normally helpful.  Tried Trudhesa, which was not effective.  Usually Intensity:  moderate to severe Duration:  2-3 hours Frequency:  once every 2 months (severe every 3 months)  Current NSAIDS/analgesics: meloxicam Current triptans:  none Current ergotamine:  none Current anti-emetic:  ondansetron 4mg , promethazine 12.5-25mg  Current muscle relaxants:  none Current Antihypertensive medications:  lisinopril Current Antidepressant medications:  venlafaxine 37.5mg  QHS Current Anticonvulsant medications:  none Current anti-CGRP:  Nurtec (rescue) Current Vitamins/Herbal/Supplements:  nnone Current Antihistamines/Decongestants:  Benadryl, loradiine Other therapy:  none Hormone/birth control:  none     Caffeine:  No coffee.  Sometimes tea Alcohol:  no Smoker:  no Diet:  Hydrates.  Watches diet Exercise:  walking, swimming, stairs or low-impact weight training 3 to 4 times a week Depression:  no; Anxiety:  no Other pain:  bilateral knee and shoulder pain - h/o surgeries Sleep hygiene:  Sleeps 7 hours.  Improved.  Has sleep apnea.  Still waiting on CPAP.    HISTORY:  Onset:  52 years old Location:  used to be back of head.  Now they are across forehead, periorbital region and  temples Quality:  pounding Intensity:  10/10.   Aura:  sees spots and haze/wavy lines in her vision Prodrome:  absent Associated symptoms:  Nausea, vomiting, lightheadedness, photophobia, phonophobia, osmophobia.  She denies associated unilateral numbness or weakness. Duration:  Usually 1 hour with Nurtec but at times may last from 2-3 hours up to 2-3 days (severe intractable migraine requiring a shot occurs about 3 times a year) Frequency:  used to be every 4 months, then last year once a month (2 severe in 6 months).  However she hasn't had a migraine since May.  She said she did change jobs which helped.   Triggers:  red or blue drinks (Kool Aid, Gatorade, [place]), chocolate, emotional stress Relieving factors:  ice pack, laying down on a cold floor. Activity:  aggravates   Past NSAIDS/analgesics:  acetaminophen, Excedrin Migraine, ibuprofen, naproxen, tramadol, oxycodone Past abortive triptans:  sumatriptan 100mg , frovatriptan, naratriptan, zolmitriptan tab/NS Past abortive ergotamine:  Trudhesa Past muscle relaxants:  baclofen, methocarbamol Past anti-emetic:  none Past antihypertensive medications:  none Past antidepressant medications:  amitriptyline Past anticonvulsant medications:  topiramate Past anti-CGRP:  none Past vitamins/Herbal/Supplements:  turmeric Past antihistamines/decongestants:  Sudafed Other past therapies:  none    Family history of headache:  mom, dad, grandparents, brothers, sisters, aunts, uncles.  PAST MEDICAL HISTORY: Past Medical History:  Diagnosis Date   Allergy    Anemia    when she was young   Arthritis    Complication of anesthesia    takes her alittle longer to wake up   GERD (gastroesophageal reflux disease)    Irritable bowel    Lactose intolerance  Migraine    PE (pulmonary embolism)    in 1994 meniscus repair.  Saw Dr. Redmond School   Pneumonia    PONV (postoperative nausea and vomiting)    Pre-diabetes    Restless legs syndrome     Sleep apnea    2007.  Uses the mask & machine when she needs it.  Dr. Redmond School is aware    MEDICATIONS: Current Outpatient Medications on File Prior to Visit  Medication Sig Dispense Refill   albuterol (VENTOLIN HFA) 108 (90 Base) MCG/ACT inhaler Inhale 2 puffs into the lungs every 6 (six) hours as needed for wheezing or shortness of breath. 8 g 0   Chlorpheniramine-Acetaminophen (CORICIDIN HBP COLD/FLU PO) Take by mouth as needed.     clobetasol (TEMOVATE) 0.05 % external solution Apply topically.     Colloidal Oatmeal (AVEENO ECZEMA THERAPY EX) Apply 1 application topically 2 (two) times daily.     dapagliflozin propanediol (FARXIGA) 10 MG TABS tablet Take 1 tablet (10 mg total) by mouth daily before breakfast. 90 tablet 1   diphenhydrAMINE (BENADRYL) 25 MG tablet Take 25 mg by mouth daily as needed for allergies.     esomeprazole (NEXIUM) 20 MG capsule Take 1 capsule (20 mg total) by mouth daily before breakfast. (Patient taking differently: Take 20 mg by mouth 2 (two) times daily as needed (for heartburn).) 30 capsule 11   glucose blood test strip Use as instructed 100 each 12   lisinopril (ZESTRIL) 5 MG tablet Take 1 tablet (5 mg total) by mouth daily. 90 tablet 1   loratadine (CLARITIN) 10 MG tablet Take 10 mg by mouth daily as needed for allergies.     metFORMIN (GLUCOPHAGE) 1000 MG tablet TAKE 1 TABLET BY MOUTH EVERY DAY WITH BREAKFAST 90 tablet 1   Minoxidil (ROGAINE MENS EXTRA STRENGTH) 5 % FOAM See admin instructions.     nitroGLYCERIN (NITRODUR - DOSED IN MG/24 HR) 0.1 mg/hr patch Place onto the skin.     ondansetron (ZOFRAN) 4 MG tablet Take 1 tablet (4 mg total) by mouth every 8 (eight) hours as needed for nausea or vomiting. 20 tablet 0   promethazine (PHENERGAN) 12.5 MG tablet Take 1-2 tablets (12.5-25 mg total) by mouth every 6 (six) hours as needed for nausea or vomiting. 30 tablet 0   Rimegepant Sulfate (NURTEC) 75 MG TBDP Take 1 tablet by mouth daily as needed. 10 tablet 5    rosuvastatin (CRESTOR) 40 MG tablet Take 1 tablet (40 mg total) by mouth daily. 90 tablet 3   selenium sulfide (SELSUN BLUE) 1 % LOTN      tirzepatide (MOUNJARO) 10 MG/0.5ML Pen Inject 10 mg into the skin once a week. 6 mL 1   triamcinolone cream (KENALOG) 0.1 % SMARTSIG:1 Application Topical 2-3 Times Daily     venlafaxine (EFFEXOR) 37.5 MG tablet Take 37.5 mg by mouth at bedtime.      No current facility-administered medications on file prior to visit.    ALLERGIES: Allergies  Allergen Reactions   Mushroom Extract Complex Anaphylaxis   Peach Flavor Anaphylaxis   Adhesive [Tape] Hives    Paper tape only   Penicillins Hives, Rash and Other (See Comments)    Tingling and shaking PATIENT HAS HAD A PCN REACTION WITH IMMEDIATE RASH, FACIAL/TONGUE/THROAT SWELLING, SOB, OR LIGHTHEADEDNESS WITH HYPOTENSION:  #  #  YES  #  #  HAS PT DEVELOPED SEVERE RASH INVOLVING MUCUS MEMBRANES or SKIN NECROSIS: #  #  YES  #  #  Has  patient had a PCN reaction that required hospitalization No Has patient had a PCN reaction occurring within the last 10 years: #  #  #  YES  #  #  #     Latex Itching    FAMILY HISTORY: Family History  Problem Relation Age of Onset   Arthritis Mother    Diabetes Mother    Heart disease Mother    Hypertension Mother    Kidney disease Mother    Stroke Mother    Gallbladder disease Mother    Glaucoma Mother    Seizures Mother    Migraines Mother    Arthritis Father    Diabetes Father    Hypertension Father    Kidney disease Father    Gallbladder disease Father    Migraines Sister    Migraines Brother    Healthy Son       Objective:  Blood pressure 132/85, pulse (!) 103, height 5\' 6"  (1.676 m), weight 203 lb 6.4 oz (92.3 kg). General: No acute distress.  Patient appears well-groomed.       , DO  CC: Shon Millet, MD

## 2022-10-01 ENCOUNTER — Telehealth: Payer: Self-pay | Admitting: Family Medicine

## 2022-10-01 ENCOUNTER — Encounter: Payer: Self-pay | Admitting: Neurology

## 2022-10-01 ENCOUNTER — Ambulatory Visit (INDEPENDENT_AMBULATORY_CARE_PROVIDER_SITE_OTHER): Payer: BC Managed Care – PPO | Admitting: Neurology

## 2022-10-01 VITALS — BP 132/85 | HR 103 | Ht 66.0 in | Wt 203.4 lb

## 2022-10-01 DIAGNOSIS — G43101 Migraine with aura, not intractable, with status migrainosus: Secondary | ICD-10-CM

## 2022-10-01 DIAGNOSIS — G4730 Sleep apnea, unspecified: Secondary | ICD-10-CM

## 2022-10-01 MED ORDER — SUMATRIPTAN SUCCINATE 6 MG/0.5ML ~~LOC~~ SOLN: 6.0000 mg | SUBCUTANEOUS | 5 refills | Status: DC | PRN

## 2022-10-01 NOTE — Telephone Encounter (Signed)
Pt called and said ya'll discussed a sleep apnea referral at her appointment in December but I don't see this referral in the system

## 2022-10-01 NOTE — Patient Instructions (Addendum)
As second line to Nurtec or if you wake up with migraine, try sumatriptan injection.

## 2022-10-02 NOTE — Telephone Encounter (Signed)
Order has to be entered as "future". New order placed today so that it shows up in the schedulers work que.

## 2022-10-02 NOTE — Addendum Note (Signed)
Addended by: Randal Buba on: 10/02/2022 05:08 PM   Modules accepted: Orders

## 2022-10-09 NOTE — Telephone Encounter (Signed)
I called [facility] and verified that order was received. They are verifying insurance and should be contacting patient soon. I tried calling patient to give update. Left detailed message on voicemail (ok per DPR).

## 2022-11-09 ENCOUNTER — Other Ambulatory Visit (INDEPENDENT_AMBULATORY_CARE_PROVIDER_SITE_OTHER): Payer: BC Managed Care – PPO

## 2022-11-09 ENCOUNTER — Encounter (HOSPITAL_BASED_OUTPATIENT_CLINIC_OR_DEPARTMENT_OTHER): Payer: BC Managed Care – PPO | Admitting: Internal Medicine

## 2022-11-09 DIAGNOSIS — Z23 Encounter for immunization: Secondary | ICD-10-CM | POA: Diagnosis not present

## 2022-11-13 ENCOUNTER — Ambulatory Visit (HOSPITAL_BASED_OUTPATIENT_CLINIC_OR_DEPARTMENT_OTHER): Payer: BC Managed Care – PPO | Attending: Family Medicine | Admitting: Internal Medicine

## 2022-11-13 VITALS — Ht 66.0 in | Wt 198.0 lb

## 2022-11-13 DIAGNOSIS — G4730 Sleep apnea, unspecified: Secondary | ICD-10-CM | POA: Diagnosis present

## 2022-11-13 DIAGNOSIS — G4733 Obstructive sleep apnea (adult) (pediatric): Secondary | ICD-10-CM | POA: Insufficient documentation

## 2022-11-16 IMAGING — US US ABDOMEN LIMITED
1 series · 14 of 25 positions shown · non-contrast
Comparison: October 14, 2019

CLINICAL DATA: Right upper quadrant pain.

EXAM:
ULTRASOUND ABDOMEN LIMITED RIGHT UPPER QUADRANT

[Series 1: us abdomen limited · 53 acquisitions, 14 frames shown]
[im 1/53]
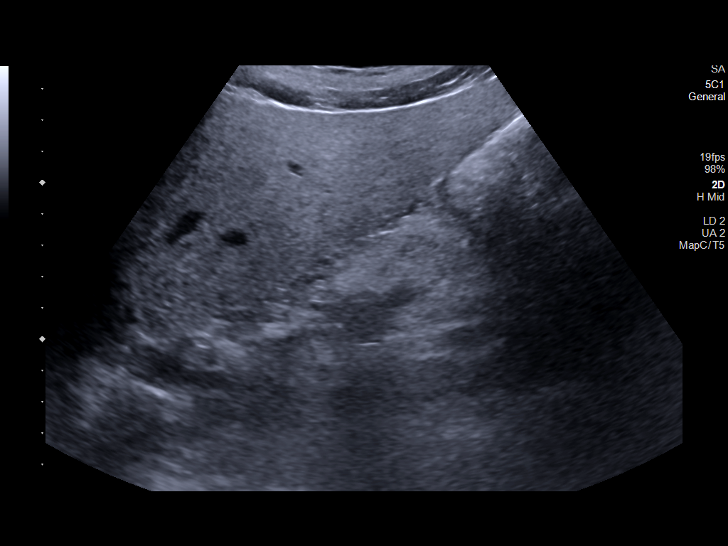
[im 5/53]
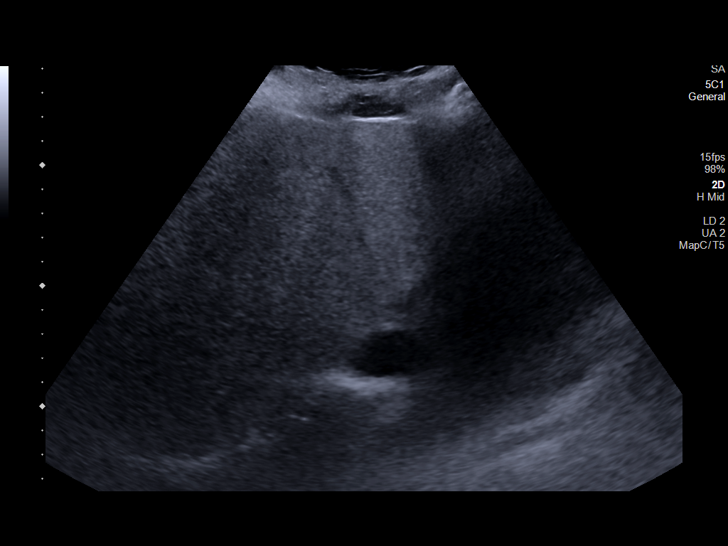
[im 9/53]
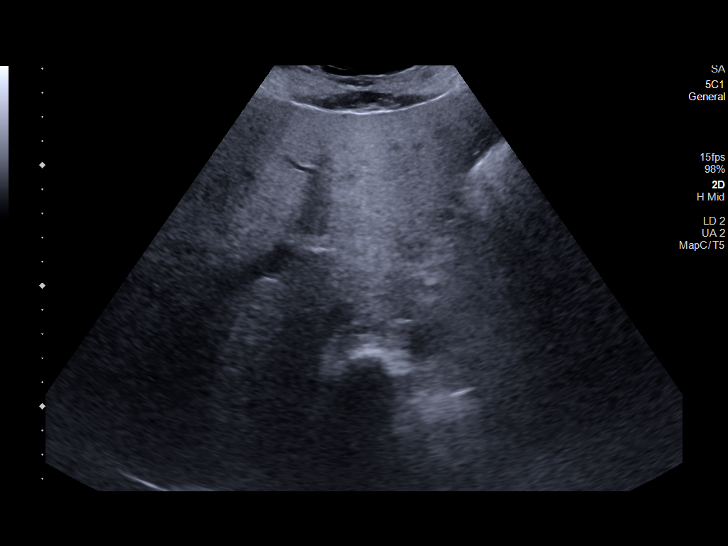
[im 14/53]
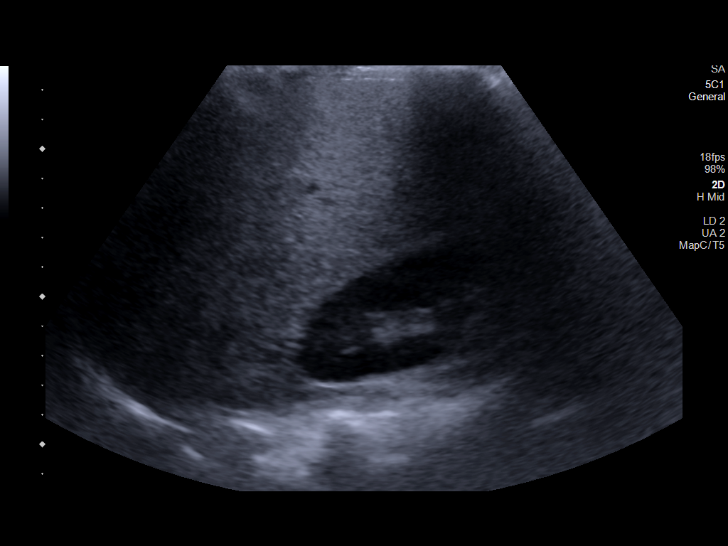
[im 18/53]
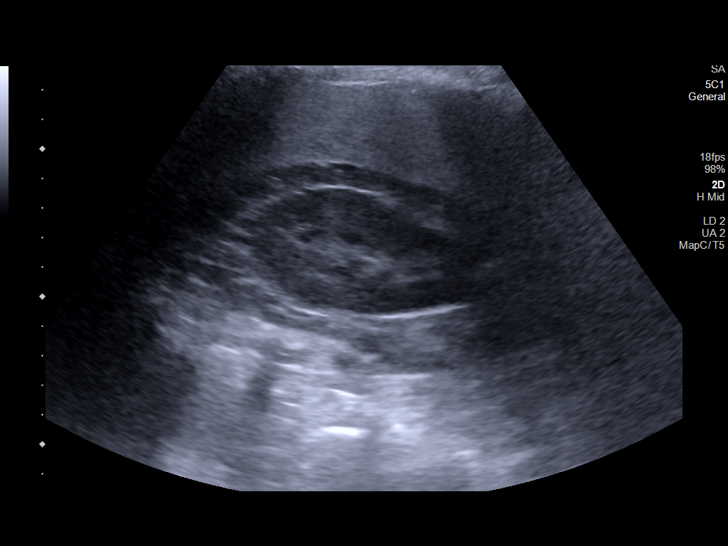
[im 20/53]
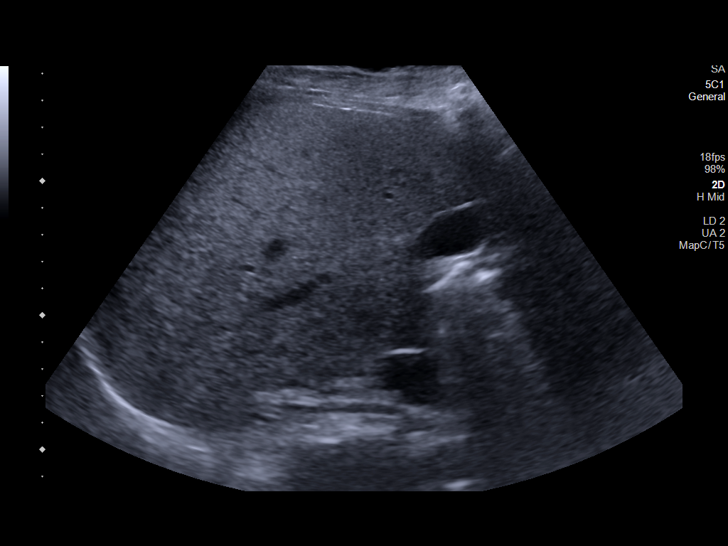
[im 24/53]
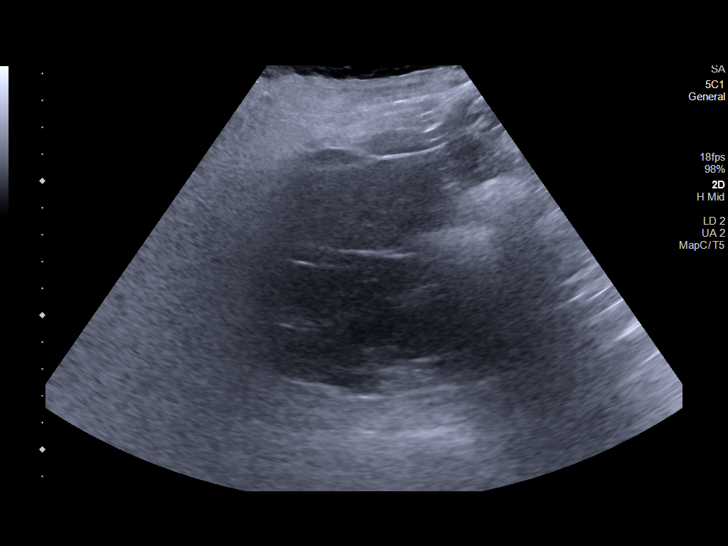
[im 29/53]
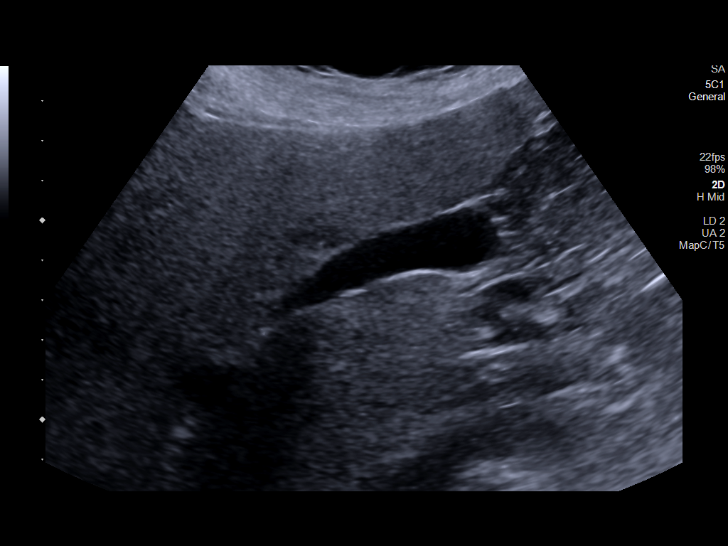
[im 33/53]
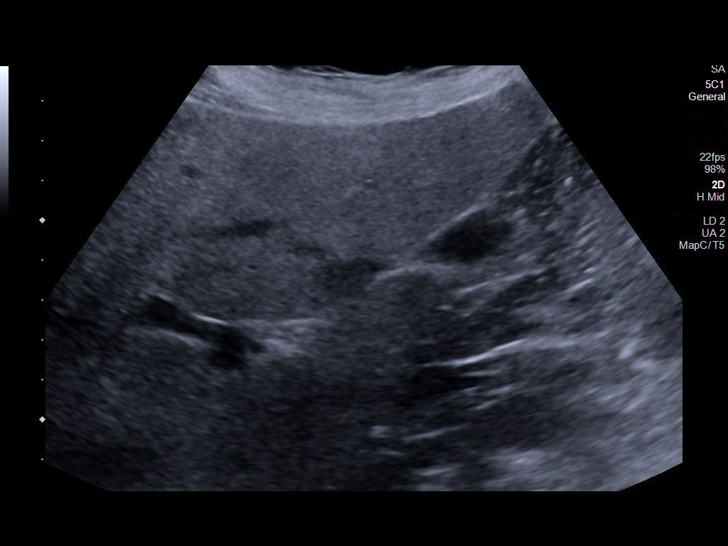
[im 35/53]
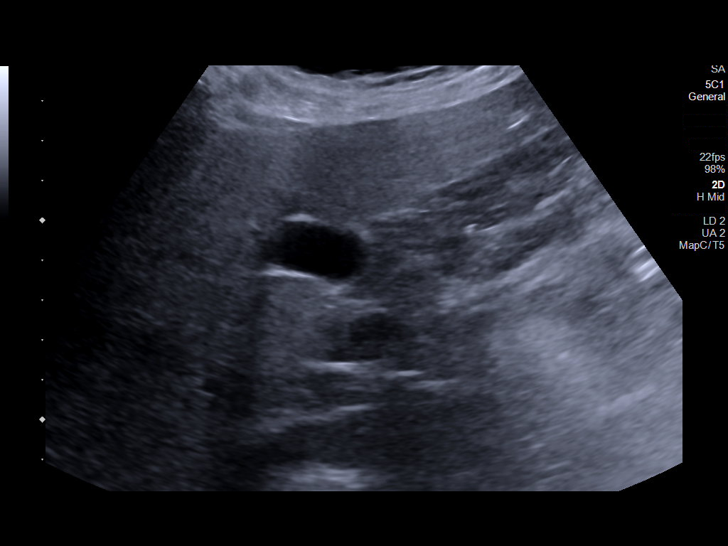
[im 40/53]
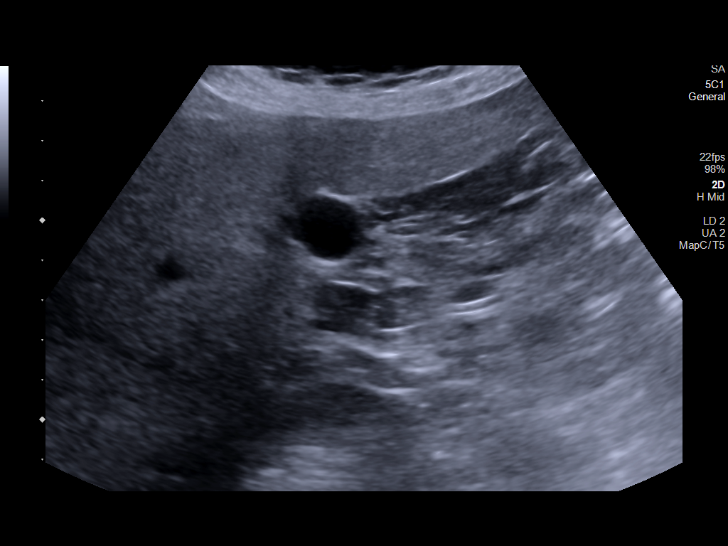
[im 44/53]
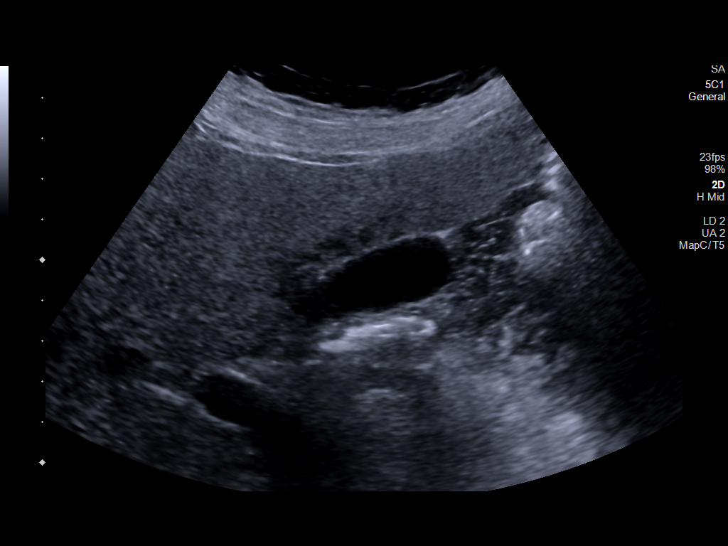
[im 48/53]
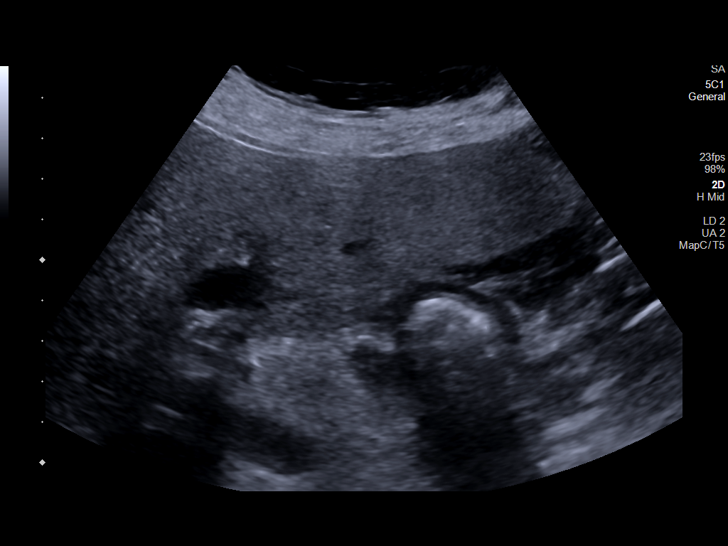
[im 53/53]
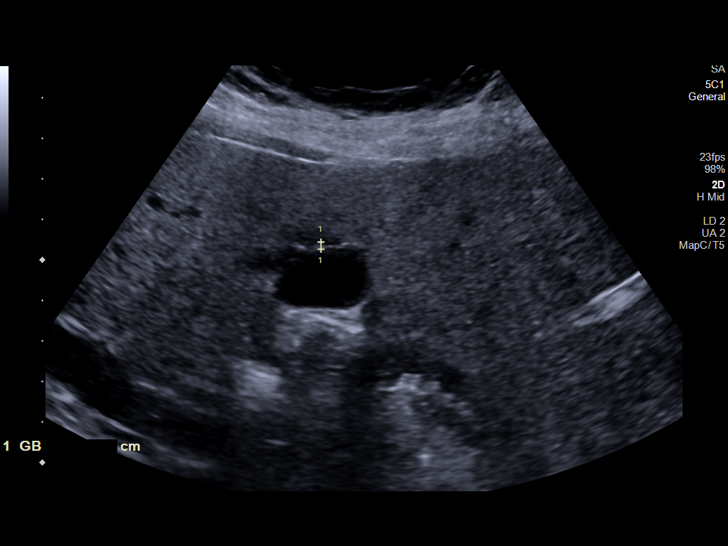

[14 of 25 positions shown; findings below may reference images not displayed]

FINDINGS: Gallbladder:

The gallbladder is contracted. No gallstones or wall thickening
visualized (1.8 mm). No sonographic Murphy sign noted by
sonographer.

Common bile duct:

Diameter: 3.1 mm

Liver:

No focal lesion identified. Diffusely increased echogenicity of the
liver parenchyma is seen. Portal vein is patent on color Doppler
imaging with normal direction of blood flow towards the liver.

Other: None.
IMPRESSION: Hepatic steatosis.

## 2022-11-18 DIAGNOSIS — G4730 Sleep apnea, unspecified: Secondary | ICD-10-CM

## 2022-11-18 NOTE — Procedures (Signed)
     Patient Name: Karen Hicks, Karen Hicks Date: 11/13/2022 Gender: Female D.O.B: Jan 14, 1971 Age (years): 51 Referring Provider: Denita Lung Height (inches): 62 Interpreting Physician: Baird Lyons MD, ABSM Weight (lbs): 198 RPSGT: Jacolyn Reedy BMI: 32 MRN: [id number] Neck Size: 15.00  CLINICAL INFORMATION Sleep Study Type: HST Indication for sleep study: OSA Epworth Sleepiness Score: 8  Most recent polysomnogram dated 07/19/2018 revealed an AHI of 8.8/h and RDI of 8.8/h.  SLEEP STUDY TECHNIQUE A multi-channel overnight portable sleep study was performed. The channels recorded were: nasal airflow, thoracic respiratory movement, and oxygen saturation with a pulse oximetry. Snoring was also monitored.  MEDICATIONS Patient self administered medications include: none reported.  SLEEP ARCHITECTURE Patient was studied for 369.5 minutes. The sleep efficiency was 100.0 % and the patient was supine for 0%. The arousal index was 0.0 per hour.  RESPIRATORY PARAMETERS The overall AHI was 7.0 per hour, with a central apnea index of 0 per hour. The oxygen nadir was 85% during sleep.  CARDIAC DATA Mean heart rate during sleep was 80.4 bpm.  IMPRESSIONS - Mild obstructive sleep apnea occurred during this study (AHI = 7.0/h). - Moderate oxygen desaturation was noted during this study (Min O2 = 85%, Mean 97%). - Patient snored.  DIAGNOSIS - Obstructive Sleep Apnea (G47.33)  RECOMMENDATIONS - Treatment for mild OSA is guided by symptoms and co-morbidity. Conservative measures may include observation, weight loss and sleep position off back. Other options, including CPAP, a fitted oral appliance, or ENT evaluation, would be based on clinical judgment. - Be careful with alcohol, sedatives and other CNS depressants that may worsen sleep apnea and disrupt normal sleep architecture. - Sleep hygiene should be reviewed to assess factors that may improve sleep quality. - Weight  management and regular exercise should be initiated or continued.  [Electronically signed] 11/18/2022 09:57 AM  Baird Lyons MD, ABSM Diplomate, American Board of Sleep Medicine NPI: [id number]                        [place]  ELECTRONICALLY SIGNED ON:  11/18/2022, 9:54 AM [place] PH: [phone]   FX: [phone] [place]

## 2022-11-20 ENCOUNTER — Telehealth: Payer: Self-pay

## 2022-11-20 NOTE — Telephone Encounter (Signed)
Tried calling patient. Left message to call back. 

## 2022-11-20 NOTE — Telephone Encounter (Signed)
-----   Message from Denita Lung, MD sent at 11/19/2022  7:40 AM EDT ----- Set her up for a virtual visit to discuss the sleep study ----- Message ----- From: Deneise Lever, MD Sent: 11/18/2022  10:00 AM EDT To: Denita Lung, MD

## 2022-11-29 NOTE — Progress Notes (Signed)
Benito Mccreedy D.[place] Phone: [phone]   Assessment and Plan:     1. Rib cage dysfunction 2. Somatic dysfunction of thoracic region 3. Somatic dysfunction of lumbar region 4. Somatic dysfunction of rib region  -Chronic with exacerbation, subsequent visit - Recurrence of rib cage dysfunction, more significant on the left side at this visit when prior visits have been right-sided rib cage pain - Patient previously has had significant relief after OMT and elected for repeat OMT today with similar relief after HVLA to thoracic region - Patient has received significant relief with OMT in the past.  Elects for repeat OMT today.  Tolerated well per note below. - Decision today to treat with OMT was based on Physical Exam  After verbal consent patient was treated with HVLA (high velocity low amplitude), ME (muscle energy), FPR (flex positional release), ST (soft tissue),   techniques in cervical, rib, thoracic  areas. Patient tolerated the procedure well with improvement in symptoms.  Patient educated on potential side effects of soreness and recommended to rest, hydrate, and use Tylenol as needed for pain control.    Pertinent previous records reviewed include none   Follow Up: As needed.  Could consider repeat OMT versus medication course   Subjective:   I, [name], am serving as a Education administrator for Doctor Glennon Mac   Chief Complaint:   rib   dysfunction    HPI:    03/09/2022 Patient is a 52 year old female complaining of rib dysfunction. Patient states went to ER 01/01/2022  Pain waxes and wanes with no particular pattern and does radiate to the right upper quadrant.   She denies any constipation or diarrhea.  She denies any urinary urgency, frequency, tenesmus, dysuria.  In the past, shortness of breath decreased ROM under breast to back been going on since may , states she has  fatty liver and no  stones in her gallbladder ,was seen by dalldorf and he recommended OMT.  Receives temporary relief with NSAIDs, but tries not to use them frequently.   03/26/2022 Patient states that she not to bad the back is feeling better still has a little annoyance from the rib under the breast on right side, is able to walk and sleep through the night   12/03/2022 Patient states that she is in left sided rib pain flare      Relevant Historical Information: Fatty liver, DM type II, bilateral knee replacement  Additional pertinent review of systems negative.   Current Outpatient Medications:    albuterol (VENTOLIN HFA) 108 (90 Base) MCG/ACT inhaler, Inhale 2 puffs into the lungs every 6 (six) hours as needed for wheezing or shortness of breath., Disp: 8 g, Rfl: 0   Chlorpheniramine-Acetaminophen (CORICIDIN HBP COLD/FLU PO), Take by mouth as needed., Disp: , Rfl:    clobetasol (TEMOVATE) 0.05 % external solution, Apply topically., Disp: , Rfl:    Colloidal Oatmeal (AVEENO ECZEMA THERAPY EX), Apply 1 application topically 2 (two) times daily., Disp: , Rfl:    dapagliflozin propanediol (FARXIGA) 10 MG TABS tablet, Take 1 tablet (10 mg total) by mouth daily before breakfast., Disp: 90 tablet, Rfl: 1   diphenhydrAMINE (BENADRYL) 25 MG tablet, Take 25 mg by mouth daily as needed for allergies., Disp: , Rfl:    esomeprazole (NEXIUM) 20 MG capsule, Take 1 capsule (20 mg total) by mouth daily before breakfast. (Patient taking differently: Take 20 mg by mouth 2 (two)  times daily as needed (for heartburn).), Disp: 30 capsule, Rfl: 11   glucose blood test strip, Use as instructed, Disp: 100 each, Rfl: 12   lisinopril (ZESTRIL) 5 MG tablet, Take 1 tablet (5 mg total) by mouth daily., Disp: 90 tablet, Rfl: 1   loratadine (CLARITIN) 10 MG tablet, Take 10 mg by mouth daily as needed for allergies., Disp: , Rfl:    metFORMIN (GLUCOPHAGE) 1000 MG tablet, TAKE 1 TABLET BY MOUTH EVERY DAY WITH BREAKFAST, Disp: 90 tablet, Rfl:  1   Minoxidil (ROGAINE MENS EXTRA STRENGTH) 5 % FOAM, See admin instructions., Disp: , Rfl:    nitroGLYCERIN (NITRODUR - DOSED IN MG/24 HR) 0.1 mg/hr patch, Place onto the skin., Disp: , Rfl:    ondansetron (ZOFRAN) 4 MG tablet, Take 1 tablet (4 mg total) by mouth every 8 (eight) hours as needed for nausea or vomiting., Disp: 20 tablet, Rfl: 0   promethazine (PHENERGAN) 12.5 MG tablet, Take 1-2 tablets (12.5-25 mg total) by mouth every 6 (six) hours as needed for nausea or vomiting., Disp: 30 tablet, Rfl: 0   Rimegepant Sulfate (NURTEC) 75 MG TBDP, Take 1 tablet by mouth daily as needed., Disp: 10 tablet, Rfl: 5   rosuvastatin (CRESTOR) 40 MG tablet, Take 1 tablet (40 mg total) by mouth daily., Disp: 90 tablet, Rfl: 3   selenium sulfide (SELSUN BLUE) 1 % LOTN, , Disp: , Rfl:    SUMAtriptan (IMITREX) 6 MG/0.5ML SOLN injection, Inject 0.5 mLs (6 mg total) into the skin as needed for migraine or headache. May repeat in 1 hour if headache persists or recurs.  Maximum 2 injections in 24 hours., Disp: 7.5 mL, Rfl: 5   tirzepatide (MOUNJARO) 10 MG/0.5ML Pen, Inject 10 mg into the skin once a week., Disp: 6 mL, Rfl: 1   triamcinolone cream (KENALOG) 0.1 %, SMARTSIG:1 Application Topical 2-3 Times Daily, Disp: , Rfl:    venlafaxine (EFFEXOR) 37.5 MG tablet, Take 37.5 mg by mouth at bedtime. , Disp: , Rfl:    Objective:     Vitals:   12/03/22 0810  BP: 122/82  Pulse: 80  SpO2: 98%  Weight: 194 lb (88 kg)  Height: 5\' 6"  (1.676 m)      Body mass index is 31.31 kg/m.    Physical Exam:    General: Well-appearing, cooperative, sitting comfortably in no acute distress.   OMT Physical Exam:    Rib: Left ribs 6/8 stuck in inhalation prior to HVLA.  No significant dysfunction after thoracic HVLA Thoracic: TTP paraspinal, T6-9 RL SR Lumbar: Mild TTP paraspinal, no significant rotation     Electronically signed by:  Benito Mccreedy D.[facility] 8:26 AM 12/03/22

## 2022-12-03 ENCOUNTER — Ambulatory Visit: Payer: BC Managed Care – PPO | Admitting: Sports Medicine

## 2022-12-03 VITALS — BP 122/82 | HR 80 | Ht 66.0 in | Wt 194.0 lb

## 2022-12-03 DIAGNOSIS — M9908 Segmental and somatic dysfunction of rib cage: Secondary | ICD-10-CM

## 2022-12-03 DIAGNOSIS — M9902 Segmental and somatic dysfunction of thoracic region: Secondary | ICD-10-CM | POA: Diagnosis not present

## 2022-12-03 DIAGNOSIS — M9903 Segmental and somatic dysfunction of lumbar region: Secondary | ICD-10-CM | POA: Diagnosis not present

## 2022-12-03 NOTE — Patient Instructions (Signed)
Good to see you   

## 2023-01-23 ENCOUNTER — Telehealth: Payer: Self-pay | Admitting: Family Medicine

## 2023-01-23 ENCOUNTER — Other Ambulatory Visit: Payer: Self-pay | Admitting: Medical

## 2023-01-23 DIAGNOSIS — G43009 Migraine without aura, not intractable, without status migrainosus: Secondary | ICD-10-CM

## 2023-01-23 MED ORDER — NURTEC 75 MG PO TBDP: 1.0000 | ORAL_TABLET | Freq: Every day | ORAL | 1 refills | Status: DC | PRN

## 2023-01-23 NOTE — Telephone Encounter (Signed)
Received a call from [facility] requesting a refill of Nurtec 75 mg. Please send to the [facility] [employer]. They can also be reached at [phone]. Sending to [place] as JCL in not in office.

## 2023-03-12 ENCOUNTER — Ambulatory Visit: Payer: BC Managed Care – PPO | Admitting: Family Medicine

## 2023-03-12 ENCOUNTER — Encounter: Payer: Self-pay | Admitting: Family Medicine

## 2023-03-12 VITALS — BP 114/82 | HR 93 | Temp 98.2°F | Resp 16 | Wt 192.4 lb

## 2023-03-12 DIAGNOSIS — Z23 Encounter for immunization: Secondary | ICD-10-CM

## 2023-03-12 DIAGNOSIS — E119 Type 2 diabetes mellitus without complications: Secondary | ICD-10-CM

## 2023-03-12 DIAGNOSIS — E1169 Type 2 diabetes mellitus with other specified complication: Secondary | ICD-10-CM | POA: Diagnosis not present

## 2023-03-12 DIAGNOSIS — T383X5A Adverse effect of insulin and oral hypoglycemic [antidiabetic] drugs, initial encounter: Secondary | ICD-10-CM

## 2023-03-12 DIAGNOSIS — E785 Hyperlipidemia, unspecified: Secondary | ICD-10-CM

## 2023-03-12 DIAGNOSIS — G43009 Migraine without aura, not intractable, without status migrainosus: Secondary | ICD-10-CM

## 2023-03-12 DIAGNOSIS — E669 Obesity, unspecified: Secondary | ICD-10-CM

## 2023-03-12 LAB — CBC WITH DIFFERENTIAL/PLATELET
Basophils Absolute: 0.1 10*3/uL (ref 0.0–0.2)
Basos: 1 %
EOS (ABSOLUTE): 0.2 10*3/uL (ref 0.0–0.4)
Eos: 2 %
Hematocrit: 43.5 % (ref 34.0–46.6)
Hemoglobin: 14.1 g/dL (ref 11.1–15.9)
Immature Grans (Abs): 0 10*3/uL (ref 0.0–0.1)
Immature Granulocytes: 0 %
Lymphocytes Absolute: 3.3 10*3/uL — ABNORMAL HIGH (ref 0.7–3.1)
Lymphs: 43 %
MCH: 27.1 pg (ref 26.6–33.0)
MCHC: 32.4 g/dL (ref 31.5–35.7)
MCV: 84 fL (ref 79–97)
Monocytes Absolute: 0.4 10*3/uL (ref 0.1–0.9)
Monocytes: 6 %
Neutrophils Absolute: 3.7 10*3/uL (ref 1.4–7.0)
Neutrophils: 48 %
Platelets: 232 10*3/uL (ref 150–450)
RBC: 5.2 x10E6/uL (ref 3.77–5.28)
RDW: 13.4 % (ref 11.7–15.4)
WBC: 7.7 10*3/uL (ref 3.4–10.8)

## 2023-03-12 LAB — COMPREHENSIVE METABOLIC PANEL
ALT: 26 IU/L (ref 0–32)
AST: 27 IU/L (ref 0–40)
Albumin: 4.4 g/dL (ref 3.8–4.9)
Alkaline Phosphatase: 106 IU/L (ref 44–121)
BUN/Creatinine Ratio: 18 (ref 9–23)
BUN: 15 mg/dL (ref 6–24)
Bilirubin Total: 0.4 mg/dL (ref 0.0–1.2)
CO2: 24 mmol/L (ref 20–29)
Calcium: 9.6 mg/dL (ref 8.7–10.2)
Chloride: 98 mmol/L (ref 96–106)
Creatinine, Ser: 0.84 mg/dL (ref 0.57–1.00)
Globulin, Total: 3.2 g/dL (ref 1.5–4.5)
Glucose: 100 mg/dL — ABNORMAL HIGH (ref 70–99)
Potassium: 5 mmol/L (ref 3.5–5.2)
Sodium: 137 mmol/L (ref 134–144)
Total Protein: 7.6 g/dL (ref 6.0–8.5)
eGFR: 84 mL/min/{1.73_m2} (ref >59)

## 2023-03-12 LAB — LIPID PANEL
Chol/HDL Ratio: 7.4 ratio — ABNORMAL HIGH (ref 0.0–4.4)
Cholesterol, Total: 325 mg/dL — ABNORMAL HIGH (ref 100–199)
HDL: 44 mg/dL (ref >39)
LDL Chol Calc (NIH): 213 mg/dL — ABNORMAL HIGH (ref 0–99)
Triglycerides: 330 mg/dL — ABNORMAL HIGH (ref 0–149)
VLDL Cholesterol Cal: 68 mg/dL — ABNORMAL HIGH (ref 5–40)

## 2023-03-12 LAB — POCT UA - MICROALBUMIN
Albumin/Creatinine Ratio, Urine, POC: <4.2
Creatinine, POC: 120.2 mg/dL
Microalbumin Ur, POC: <5 mg/L

## 2023-03-12 LAB — POCT GLYCOSYLATED HEMOGLOBIN (HGB A1C): Hemoglobin A1C: 5.9 % — AB (ref 4.0–5.6)

## 2023-03-12 MED ORDER — NURTEC 75 MG PO TBDP
1.0000 | ORAL_TABLET | Freq: Every day | ORAL | 1 refills | Status: DC | PRN
Start: 2023-03-12 — End: 2024-04-07

## 2023-03-12 MED ORDER — TIRZEPATIDE 10 MG/0.5ML ~~LOC~~ SOAJ: 10.0000 mg | SUBCUTANEOUS | 1 refills | Status: DC

## 2023-03-12 MED ORDER — DAPAGLIFLOZIN PROPANEDIOL 10 MG PO TABS: 10.0000 mg | ORAL_TABLET | Freq: Every day | ORAL | 1 refills | Status: DC

## 2023-03-12 NOTE — Progress Notes (Signed)
Subjective:    Patient ID: Karen Hicks, female    DOB: 11/28/1970, 52 y.o.   MRN: [id number]  Karen Hicks is a 52 y.o. female who presents for follow-up of Type 2 diabetes mellitus.  Home blood sugar records: fasting range: under 100 Current symptoms/problems include polydipsia and have been stable. Daily foot checks: yes   Any foot concerns: no How often blood sugars checked: daily Exercise: Home exercise routine includes walking 4 times a week. Diet: regular diet She had difficulty with diarrhea and stopped taking the metformin.  The diarrhea went away.  She continues on [place].  She has lost at least 1 dress size.  Rosuvastatin is causing no trouble.  She does have migraine headaches and uses injectable Imitrex as well as Nurtec when needed.  The following portions of the patient's history were reviewed and updated as appropriate: allergies, current medications, past medical history, past social history and problem list.  ROS as in subjective above.     Objective:    Physical Exam Alert and in no distress otherwise not examined. Hemoglobin A1c is 5.9 Lab Review    Latest Ref Rng & Units 03/12/2023    9:44 AM 08/31/2022   11:32 AM 04/30/2022   10:03 AM 01/01/2022    8:59 PM 08/12/2021    4:34 AM  Diabetic Labs  HbA1c 4.0 - 5.6 % 5.9  8.2  8.3     Microalbumin mg/L   22.0     Micro/Creat Ratio    6.5     Creatinine 0.44 - 1.00 mg/dL    4.09  8.11       05/04/4781    9:21 AM 12/03/2022    8:10 AM 11/13/2022    9:19 AM 10/01/2022    9:11 AM 08/31/2022   11:16 AM  BP/Weight  Systolic BP 114 122  132 144  Diastolic BP 82 82  85 90  Wt. (Lbs) 192.4 194 198 203.4 206  BMI 31.05 kg/m2 31.31 kg/m2 31.96 kg/m2 32.83 kg/m2 34.28 kg/m2      Latest Ref Rng & Units 04/30/2022    8:30 AM 03/04/2022   12:00 AM  Foot/eye exam completion dates  Eye Exam No Retinopathy  No Retinopathy      Foot Form Completion  Done      This result is from an external  source.    Karen Hicks  reports that she has never smoked. She has never been exposed to tobacco smoke. She has never used smokeless tobacco. She reports that she does not currently use alcohol. She reports that she does not use drugs.     Assessment & Plan:    Type 2 diabetes mellitus without complication, without long-term current use of insulin (HCC) - Plan: CBC with Differential/Platelet, Comprehensive metabolic panel, POCT glycosylated hemoglobin (Hb A1C), POCT UA - Microalbumin, dapagliflozin propanediol (FARXIGA) 10 MG TABS tablet  Hyperlipidemia associated with type 2 diabetes mellitus (HCC) - Plan: Lipid panel  Obesity (BMI 30-39.9)  Need for shingles vaccine - Plan: Varicella-zoster vaccine IM  Chronic migraine without aura without status migrainosus, not intractable  Adverse effect of metformin, initial encounter  Migraine without aura and without status migrainosus, not intractable - Plan: Rimegepant Sulfate (NURTEC) 75 MG TBDP  I explained that hemoglobin A1c of 5.9 is really not totally accurate since she stopped her metformin about a month ago.  I do not think we really need to restart that anytime soon.  Continue on her Marcelline Deist and Greggory Keen at  her present dosing regimen.  She does plan to continue to lose weight.

## 2023-03-28 NOTE — Progress Notes (Signed)
NEUROLOGY FOLLOW UP OFFICE NOTE  Latiya Navia [id number]  Assessment/Plan:   Migraine with aura, without status migrainosus, not intractable   Migraine prevention:  Not indicated. Migraine rescue:  Nurtec; sumatriptan 6mg  [place] for when she wakes up with migraine and for second line to Nurtec if needed. Limit use of pain relievers to no more than 2 days out of week to prevent risk of rebound or medication-overuse headache. Keep headache diary Follow up 9 months.       Subjective:  Lillionna Nabi is a 52 year old female with sleep apnea, arthritis, fatty liver, and history of PE (1994 related to surgery) who follows up for migraines.  UPDATE: Sumatriptan [place] effective for treating the severe migraines or wake up with migraine. Intensity:  moderate to severe Duration:  within an hour Frequency:  once every 2 months (severe every 3 months)  Current NSAIDS/analgesics: meloxicam Current triptans:  sumatriptan 6mg  [place] Current ergotamine:  none Current anti-emetic:  ondansetron 4mg , promethazine 12.5-25mg  Current muscle relaxants:  none Current Antihypertensive medications:  lisinopril Current Antidepressant medications:  venlafaxine 37.5mg  QHS Current Anticonvulsant medications:  none Current anti-CGRP:  Nurtec (rescue) Current Vitamins/Herbal/Supplements:  nnone Current Antihistamines/Decongestants:  Benadryl, loradiine Other therapy:  none Hormone/birth control:  none     Caffeine:  No coffee.  Sometimes tea Alcohol:  no Smoker:  no Diet:  Hydrates.  Watches diet Exercise:  walking, swimming, stairs or low-impact weight training 3 to 4 times a week Depression:  no; Anxiety:  no Other pain:  bilateral knee and shoulder pain - h/o surgeries Sleep hygiene:  Sleeps 7 hours.  Improved.  Has sleep apnea.  Still waiting on CPAP.    HISTORY:  Onset:  52 years old Location:  used to be back of head.  Now they are across forehead, periorbital region and  temples Quality:  pounding Intensity:  10/10.   Aura:  sees spots and haze/wavy lines in her vision Prodrome:  absent Associated symptoms:  Nausea, vomiting, lightheadedness, photophobia, phonophobia, osmophobia.  She denies associated unilateral numbness or weakness. Duration:  Usually 1 hour with Nurtec but at times may last from 2-3 hours up to 2-3 days (severe intractable migraine requiring a shot occurs about 3 times a year) Frequency:  used to be every 4 months, then last year once a month (2 severe in 6 months).  However she hasn't had a migraine since May.  She said she did change jobs which helped.   Triggers:  red or blue drinks (Kool Aid, Gatorade, [place]), chocolate, emotional stress Relieving factors:  ice pack, laying down on a cold floor. Activity:  aggravates   Past NSAIDS/analgesics:  acetaminophen, Excedrin Migraine, ibuprofen, naproxen, tramadol, oxycodone Past abortive triptans:  sumatriptan 100mg , frovatriptan, naratriptan, zolmitriptan tab/NS Past abortive ergotamine:  Trudhesa Past muscle relaxants:  baclofen, methocarbamol Past anti-emetic:  none Past antihypertensive medications:  none Past antidepressant medications:  amitriptyline Past anticonvulsant medications:  topiramate Past anti-CGRP:  none Past vitamins/Herbal/Supplements:  turmeric Past antihistamines/decongestants:  Sudafed Other past therapies:  none    Family history of headache:  mom, dad, grandparents, brothers, sisters, aunts, uncles.  PAST MEDICAL HISTORY: Past Medical History:  Diagnosis Date   Allergy    Anemia    when she was young   Arthritis    Complication of anesthesia    takes her alittle longer to wake up   GERD (gastroesophageal reflux disease)    Irritable bowel    Lactose intolerance    Migraine  PE (pulmonary embolism)    in 1994 meniscus repair.  Saw Dr. Susann Givens   Pneumonia    PONV (postoperative nausea and vomiting)    Pre-diabetes    Restless legs syndrome     Sleep apnea    2007.  Uses the mask & machine when she needs it.  Dr. Susann Givens is aware    MEDICATIONS: Current Outpatient Medications on File Prior to Visit  Medication Sig Dispense Refill   albuterol (VENTOLIN HFA) 108 (90 Base) MCG/ACT inhaler Inhale 2 puffs into the lungs every 6 (six) hours as needed for wheezing or shortness of breath. 8 g 0   Chlorpheniramine-Acetaminophen (CORICIDIN HBP COLD/FLU PO) Take by mouth as needed.     clobetasol (TEMOVATE) 0.05 % external solution Apply topically.     Colloidal Oatmeal (AVEENO ECZEMA THERAPY EX) Apply 1 application topically 2 (two) times daily.     dapagliflozin propanediol (FARXIGA) 10 MG TABS tablet Take 1 tablet (10 mg total) by mouth daily before breakfast. 90 tablet 1   diphenhydrAMINE (BENADRYL) 25 MG tablet Take 25 mg by mouth daily as needed for allergies.     esomeprazole (NEXIUM) 20 MG capsule Take 1 capsule (20 mg total) by mouth daily before breakfast. (Patient taking differently: Take 20 mg by mouth 2 (two) times daily as needed (for heartburn).) 30 capsule 11   glucose blood test strip Use as instructed 100 each 12   lisinopril (ZESTRIL) 5 MG tablet Take 1 tablet (5 mg total) by mouth daily. 90 tablet 1   loratadine (CLARITIN) 10 MG tablet Take 10 mg by mouth daily as needed for allergies.     Minoxidil (ROGAINE MENS EXTRA STRENGTH) 5 % FOAM See admin instructions.     nitroGLYCERIN (NITRODUR - DOSED IN MG/24 HR) 0.1 mg/hr patch Place onto the skin.     ondansetron (ZOFRAN) 4 MG tablet Take 1 tablet (4 mg total) by mouth every 8 (eight) hours as needed for nausea or vomiting. 20 tablet 0   promethazine (PHENERGAN) 12.5 MG tablet Take 1-2 tablets (12.5-25 mg total) by mouth every 6 (six) hours as needed for nausea or vomiting. 30 tablet 0   Rimegepant Sulfate (NURTEC) 75 MG TBDP Take 1 tablet (75 mg total) by mouth daily as needed. 10 tablet 1   rosuvastatin (CRESTOR) 40 MG tablet Take 1 tablet (40 mg total) by mouth daily. 90  tablet 3   selenium sulfide (SELSUN BLUE) 1 % LOTN      SUMAtriptan (IMITREX) 6 MG/0.5ML SOLN injection Inject 0.5 mLs (6 mg total) into the skin as needed for migraine or headache. May repeat in 1 hour if headache persists or recurs.  Maximum 2 injections in 24 hours. 7.5 mL 5   tirzepatide (MOUNJARO) 10 MG/0.5ML Pen Inject 10 mg into the skin once a week. 6 mL 1   triamcinolone cream (KENALOG) 0.1 % SMARTSIG:1 Application Topical 2-3 Times Daily     venlafaxine (EFFEXOR) 37.5 MG tablet Take 37.5 mg by mouth at bedtime.      No current facility-administered medications on file prior to visit.    ALLERGIES: Allergies  Allergen Reactions   Mushroom Extract Complex Anaphylaxis   Peach Flavor Anaphylaxis   Adhesive [Tape] Hives    Paper tape only   Penicillins Hives, Rash and Other (See Comments)    Tingling and shaking PATIENT HAS HAD A PCN REACTION WITH IMMEDIATE RASH, FACIAL/TONGUE/THROAT SWELLING, SOB, OR LIGHTHEADEDNESS WITH HYPOTENSION:  #  #  YES  #  #  HAS PT DEVELOPED SEVERE RASH INVOLVING MUCUS MEMBRANES or SKIN NECROSIS: #  #  YES  #  #  Has patient had a PCN reaction that required hospitalization No Has patient had a PCN reaction occurring within the last 10 years: #  #  #  YES  #  #  #     Latex Itching    FAMILY HISTORY: Family History  Problem Relation Age of Onset   Arthritis Mother    Diabetes Mother    Heart disease Mother    Hypertension Mother    Kidney disease Mother    Stroke Mother    Gallbladder disease Mother    Glaucoma Mother    Seizures Mother    Migraines Mother    Arthritis Father    Diabetes Father    Hypertension Father    Kidney disease Father    Gallbladder disease Father    Migraines Sister    Migraines Brother    Healthy Son       Objective:  Blood pressure 131/82, pulse 81, height 5\' 6"  (1.676 m), weight 193 lb (87.5 kg), SpO2 99%. General: No acute distress.  Patient appears well-groomed.   Head:  Normocephalic/atraumatic Neck:   Supple.  No paraspinal tenderness.  Full range of motion. Heart:  Regular rate and rhythm. Neuro:  Alert and oriented.  Speech fluent and not dysarthric.  Language intact.  CN II-XII intact.  Bulk and tone normal.  Muscle strength 5/5 throughout.  Deep tendon reflexes 2+ throughout.  Gait normal.  Romberg negative.     Shon Millet, DO  CC: Sharlot Gowda, MD

## 2023-04-01 ENCOUNTER — Ambulatory Visit: Payer: BC Managed Care – PPO | Admitting: Neurology

## 2023-04-01 ENCOUNTER — Encounter: Payer: Self-pay | Admitting: Neurology

## 2023-04-01 VITALS — BP 131/82 | HR 81 | Ht 66.0 in | Wt 193.0 lb

## 2023-04-01 DIAGNOSIS — G43101 Migraine with aura, not intractable, with status migrainosus: Secondary | ICD-10-CM | POA: Diagnosis not present

## 2023-04-01 MED ORDER — SUMATRIPTAN SUCCINATE 6 MG/0.5ML ~~LOC~~ SOLN: 6.0000 mg | SUBCUTANEOUS | 11 refills | Status: DC | PRN

## 2023-05-03 ENCOUNTER — Encounter: Payer: BC Managed Care – PPO | Admitting: Family Medicine

## 2023-05-09 ENCOUNTER — Encounter: Payer: BC Managed Care – PPO | Admitting: Family Medicine

## 2023-05-19 ENCOUNTER — Ambulatory Visit
Admission: EM | Admit: 2023-05-19 | Discharge: 2023-05-19 | Disposition: A | Discharge status: Home / Self Care | Payer: BC Managed Care – PPO | Attending: Internal Medicine | Admitting: Internal Medicine

## 2023-05-19 DIAGNOSIS — J4530 Mild persistent asthma, uncomplicated: Secondary | ICD-10-CM | POA: Diagnosis not present

## 2023-05-19 DIAGNOSIS — B349 Viral infection, unspecified: Secondary | ICD-10-CM | POA: Diagnosis present

## 2023-05-19 DIAGNOSIS — U071 COVID-19: Secondary | ICD-10-CM | POA: Diagnosis not present

## 2023-05-19 DIAGNOSIS — E119 Type 2 diabetes mellitus without complications: Secondary | ICD-10-CM | POA: Insufficient documentation

## 2023-05-19 MED ORDER — MOLNUPIRAVIR EUA 200MG CAPSULE: 4.0000 | ORAL_CAPSULE | Freq: Two times a day (BID) | ORAL | 0 refills | Status: AC

## 2023-05-19 MED ORDER — PSEUDOEPHEDRINE HCL 30 MG PO TABS: 30.0000 mg | ORAL_TABLET | Freq: Three times a day (TID) | ORAL | 0 refills | Status: AC | PRN

## 2023-05-19 MED ORDER — CETIRIZINE HCL 10 MG PO TABS: 10.0000 mg | ORAL_TABLET | Freq: Every day | ORAL | 0 refills | Status: AC

## 2023-05-19 MED ORDER — PROMETHAZINE-DM 6.25-15 MG/5ML PO SYRP: 5.0000 mL | ORAL_SOLUTION | Freq: Three times a day (TID) | ORAL | 0 refills | Status: DC | PRN

## 2023-05-19 NOTE — Discharge Instructions (Addendum)
We will notify you of your test results as they arrive and may take between about 24 hours.  I encourage you to sign up for MyChart if you have not already done so as this can be the easiest way for [place] to communicate results to you online or through a phone app.  Generally, we only contact you if it is a positive test result.  In the meantime, if you develop worsening symptoms including fever, chest pain, shortness of breath despite our current treatment plan then please report to the emergency room as this may be a sign of worsening status from possible viral infection.  Otherwise, start molnupiravir to get ahead of the suspected COVID 19 infection. For sore throat or cough try using a honey-based tea. Use 3 teaspoons of honey with juice squeezed from half lemon. Place shaved pieces of ginger into 1/2-1 cup of water and warm over stove top. Then mix the ingredients and repeat every 4 hours as needed. Please take Tylenol 650mg  every 6 hours for aches and pains, fevers. Hydrate very well with at least 2 liters of water. Eat light meals such as soups to replenish electrolytes and soft fruits, veggies. Start an antihistamine like Zyrtec (10mg  daily) for postnasal drainage, sinus congestion.  You can take this together with pseudoephedrine (Sudafed) at a dose of 30 mg 2-3 times a day as needed for the same kind of congestion.  Use the cough medications as needed.

## 2023-05-19 NOTE — ED Triage Notes (Signed)
Pt c/o HA, prod cough, head/chest congestion started 2 days ago-reports +covid exposure-NAD-steady gait

## 2023-05-19 NOTE — ED Provider Notes (Signed)
[facility]  Note:  This document was prepared using Conservation officer, historic buildings and may include unintentional dictation errors.  MRN: [id number] DOB: October 13, 1970  Subjective:   Karen Hicks is a 52 y.o. female presenting for 2-day history of acute onset of productive cough, productive green mucus from her sinuses, headaches, malaise and fatigue.  No chest pain, shortness of breath or wheezing.  Patient does have asthma, does not need an albuterol inhaler refill.  Has not used it.  She just came back from a cruise.  Had exposure to multiple COVID patients including her mother who is now in the hospital from COVID-19.  Patient is a type II diabetic managed without insulin.  No current facility-administered medications for this encounter.  Current Outpatient Medications:    albuterol (VENTOLIN HFA) 108 (90 Base) MCG/ACT inhaler, Inhale 2 puffs into the lungs every 6 (six) hours as needed for wheezing or shortness of breath., Disp: 8 g, Rfl: 0   Chlorpheniramine-Acetaminophen (CORICIDIN HBP COLD/FLU PO), Take by mouth as needed., Disp: , Rfl:    clobetasol (TEMOVATE) 0.05 % external solution, Apply topically., Disp: , Rfl:    Colloidal Oatmeal (AVEENO ECZEMA THERAPY EX), Apply 1 application topically 2 (two) times daily., Disp: , Rfl:    dapagliflozin propanediol (FARXIGA) 10 MG TABS tablet, Take 1 tablet (10 mg total) by mouth daily before breakfast., Disp: 90 tablet, Rfl: 1   diphenhydrAMINE (BENADRYL) 25 MG tablet, Take 25 mg by mouth daily as needed for allergies., Disp: , Rfl:    esomeprazole (NEXIUM) 20 MG capsule, Take 1 capsule (20 mg total) by mouth daily before breakfast. (Patient taking differently: Take 20 mg by mouth daily at 2 PM.), Disp: 30 capsule, Rfl: 11   glucose blood test strip, Use as instructed, Disp: 100 each, Rfl: 12   lisinopril (ZESTRIL) 5 MG tablet, Take 1 tablet (5 mg total) by mouth daily., Disp: 90 tablet, Rfl: 1   loratadine  (CLARITIN) 10 MG tablet, Take 10 mg by mouth daily as needed for allergies., Disp: , Rfl:    nitroGLYCERIN (NITRODUR - DOSED IN MG/24 HR) 0.1 mg/hr patch, Place onto the skin., Disp: , Rfl:    ondansetron (ZOFRAN) 4 MG tablet, Take 1 tablet (4 mg total) by mouth every 8 (eight) hours as needed for nausea or vomiting., Disp: 20 tablet, Rfl: 0   promethazine (PHENERGAN) 12.5 MG tablet, Take 1-2 tablets (12.5-25 mg total) by mouth every 6 (six) hours as needed for nausea or vomiting., Disp: 30 tablet, Rfl: 0   Rimegepant Sulfate (NURTEC) 75 MG TBDP, Take 1 tablet (75 mg total) by mouth daily as needed., Disp: 10 tablet, Rfl: 1   rosuvastatin (CRESTOR) 40 MG tablet, Take 1 tablet (40 mg total) by mouth daily., Disp: 90 tablet, Rfl: 3   selenium sulfide (SELSUN BLUE) 1 % LOTN, , Disp: , Rfl:    SUMAtriptan (IMITREX) 6 MG/0.5ML SOLN injection, Inject 0.5 mLs (6 mg total) into the skin as needed for migraine or headache. May repeat in 1 hour if headache persists or recurs.  Maximum 2 injections in 24 hours.AutoInjector, Disp: 7.5 mL, Rfl: 11   tirzepatide (MOUNJARO) 10 MG/0.5ML Pen, Inject 10 mg into the skin once a week., Disp: 6 mL, Rfl: 1   triamcinolone cream (KENALOG) 0.1 %, SMARTSIG:1 Application Topical 2-3 Times Daily, Disp: , Rfl:    venlafaxine (EFFEXOR) 37.5 MG tablet, Take 37.5 mg by mouth at bedtime. , Disp: , Rfl:    Allergies  Allergen Reactions   Mushroom Extract Complex Anaphylaxis   Peach Flavor Anaphylaxis   Adhesive [Tape] Hives    Paper tape only   Penicillins Hives, Rash and Other (See Comments)    Tingling and shaking PATIENT HAS HAD A PCN REACTION WITH IMMEDIATE RASH, FACIAL/TONGUE/THROAT SWELLING, SOB, OR LIGHTHEADEDNESS WITH HYPOTENSION:  #  #  YES  #  #  HAS PT DEVELOPED SEVERE RASH INVOLVING MUCUS MEMBRANES or SKIN NECROSIS: #  #  YES  #  #  Has patient had a PCN reaction that required hospitalization No Has patient had a PCN reaction occurring within the last 10 years: #   #  #  YES  #  #  #     Latex Itching    Past Medical History:  Diagnosis Date   Allergy    Anemia    when she was young   Arthritis    Complication of anesthesia    takes her alittle longer to wake up   GERD (gastroesophageal reflux disease)    Irritable bowel    Lactose intolerance    Migraine    PE (pulmonary embolism)    in 1994 meniscus repair.  Saw Dr. Susann Givens   Pneumonia    PONV (postoperative nausea and vomiting)    Pre-diabetes    Restless legs syndrome    Sleep apnea    2007.  Uses the mask & machine when she needs it.  Dr. 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  07/19/2014   right knee moderate DJD with multiple surgeries  07/19/2014   right wrist arthroscopy  2009   TOTAL ABDOMINAL HYSTERECTOMY  01/2006   FIBROIDS   TOTAL KNEE ARTHROPLASTY Left 08/03/2014   dr Jerl Santos   TOTAL KNEE ARTHROPLASTY Left 08/03/2014   Procedure: LEFT TOTAL KNEE ARTHROPLASTY;  Surgeon: Velna Ochs, MD;  Location: [facility] OR;  Service: Orthopedics;  Laterality: Left;   TOTAL KNEE ARTHROPLASTY Right 07/31/2016   Procedure: TOTAL KNEE ARTHROPLASTY;  Surgeon: Marcene Corning, MD;  Location: [facility] OR;  Service: Orthopedics;  Laterality: Right;   UPPER GI ENDOSCOPY  02/18/2020    Family History  Problem Relation Age of Onset   Arthritis Mother    Diabetes Mother    Heart disease Mother    Hypertension Mother    Kidney disease Mother    Stroke Mother    Gallbladder disease Mother    Glaucoma Mother    Seizures Mother    Migraines  Mother    Arthritis Father    Diabetes Father    Hypertension Father    Kidney disease Father    Gallbladder disease Father    Migraines Sister    Migraines Brother    Healthy Son     Social History   Tobacco Use   Smoking status: Never    Passive exposure: Never   Smokeless tobacco: Never  Vaping Use   Vaping status: Never Used  Substance Use Topics   Alcohol use: Not Currently  Drug use: No    ROS   Objective:   Vitals: BP (!) 162/89 (BP Location: Right Arm)   Pulse 86   Temp 100 F (37.8 C) (Oral)   Resp 20   SpO2 98%   Physical Exam Constitutional:      General: She is not in acute distress.    Appearance: Normal appearance. She is well-developed. She is not ill-appearing, toxic-appearing or diaphoretic.  HENT:     Head: Normocephalic and atraumatic.     Right Ear: External ear normal.     Left Ear: External ear normal.     Nose: Congestion and rhinorrhea present.     Mouth/Throat:     Mouth: Mucous membranes are moist.  Eyes:     General: No scleral icterus.       Right eye: No discharge.        Left eye: No discharge.     Extraocular Movements: Extraocular movements intact.  Cardiovascular:     Rate and Rhythm: Normal rate and regular rhythm.     Heart sounds: Normal heart sounds. No murmur heard.    No friction rub. No gallop.  Pulmonary:     Effort: Pulmonary effort is normal. No respiratory distress.     Breath sounds: No stridor. No wheezing, rhonchi or rales.  Chest:     Chest wall: No tenderness.  Skin:    General: Skin is warm and dry.  Neurological:     General: No focal deficit present.     Mental Status: She is alert and oriented to person, place, and time.     Cranial Nerves: No cranial nerve deficit.     Motor: No weakness.     Coordination: Coordination normal.     Gait: Gait normal.     Comments: Negative Romberg and pronator drift.  No facial asymmetry.  Psychiatric:        Mood and Affect: Mood normal.        Behavior:  Behavior normal.     Assessment and Plan :   PDMP not reviewed this encounter.  1. Acute viral syndrome   2. Mild persistent asthma without complication   3. Type 2 diabetes mellitus treated without insulin (HCC)    High suspicion for COVID-19 given her close exposure, symptoms.  Provided her with a prescription for molnupiravir as she is a high risk patient given her diabetes, obesity, asthma.  Deferred imaging given clear pulmonary exam and hemodynamically stable vital signs.  COVID testing pending.  Counseled patient on potential for adverse effects with medications prescribed/recommended today, ER and return-to-clinic precautions discussed, patient verbalized understanding.    Wallis Bamberg, PA-C 05/19/23 1529

## 2023-05-20 LAB — SARS CORONAVIRUS 2 (TAT 6-24 HRS): SARS Coronavirus 2: POSITIVE — AB

## 2023-05-30 ENCOUNTER — Telehealth: Payer: Self-pay

## 2023-05-30 NOTE — Telephone Encounter (Signed)
Key: [id number] PA Case ID #: [id number] Rx #: [id number] Status: sent iconSent to Plan today Drug: Mounjaro 10MG /0.5ML auto-injectors Form: Ambulance person PA Form [phone] NCPDP)

## 2023-05-31 NOTE — Telephone Encounter (Signed)
Key: [id number] PA Case ID #: [id number] Rx #: [id number] Outcome: Approved on September 26 by [employer] NCPDP 2017  Your PA request has been approved. Additional information will be provided in the approval communication.  Authorization Expiration Date: 05/28/2026 Drug: Greggory Keen 10MG /0.5ML auto-injectors Form: Ambulance person PA Form [phone] NCPDP)

## 2023-05-31 NOTE — Telephone Encounter (Signed)
Pt notified via phone. Approval faxed to pharmacy.

## 2023-06-06 ENCOUNTER — Telehealth: Payer: Self-pay | Admitting: Pulmonary Disease

## 2023-06-06 ENCOUNTER — Ambulatory Visit: Payer: BC Managed Care – PPO | Admitting: Family Medicine

## 2023-06-06 ENCOUNTER — Encounter: Payer: Self-pay | Admitting: Family Medicine

## 2023-06-06 VITALS — BP 118/72 | HR 97 | Ht 66.75 in | Wt 190.8 lb

## 2023-06-06 DIAGNOSIS — G4730 Sleep apnea, unspecified: Secondary | ICD-10-CM

## 2023-06-06 DIAGNOSIS — J45990 Exercise induced bronchospasm: Secondary | ICD-10-CM

## 2023-06-06 DIAGNOSIS — K219 Gastro-esophageal reflux disease without esophagitis: Secondary | ICD-10-CM

## 2023-06-06 DIAGNOSIS — E785 Hyperlipidemia, unspecified: Secondary | ICD-10-CM

## 2023-06-06 DIAGNOSIS — E669 Obesity, unspecified: Secondary | ICD-10-CM | POA: Diagnosis not present

## 2023-06-06 DIAGNOSIS — E1169 Type 2 diabetes mellitus with other specified complication: Secondary | ICD-10-CM

## 2023-06-06 DIAGNOSIS — G43019 Migraine without aura, intractable, without status migrainosus: Secondary | ICD-10-CM

## 2023-06-06 DIAGNOSIS — J301 Allergic rhinitis due to pollen: Secondary | ICD-10-CM

## 2023-06-06 DIAGNOSIS — E119 Type 2 diabetes mellitus without complications: Secondary | ICD-10-CM

## 2023-06-06 DIAGNOSIS — Z96652 Presence of left artificial knee joint: Secondary | ICD-10-CM

## 2023-06-06 DIAGNOSIS — Z23 Encounter for immunization: Secondary | ICD-10-CM

## 2023-06-06 DIAGNOSIS — M1990 Unspecified osteoarthritis, unspecified site: Secondary | ICD-10-CM

## 2023-06-06 DIAGNOSIS — Z0000 Encounter for general adult medical examination without abnormal findings: Secondary | ICD-10-CM | POA: Diagnosis not present

## 2023-06-06 DIAGNOSIS — Z96651 Presence of right artificial knee joint: Secondary | ICD-10-CM

## 2023-06-06 DIAGNOSIS — T383X5D Adverse effect of insulin and oral hypoglycemic [antidiabetic] drugs, subsequent encounter: Secondary | ICD-10-CM

## 2023-06-06 LAB — POCT GLYCOSYLATED HEMOGLOBIN (HGB A1C): Hemoglobin A1C: 6.3 % — AB (ref 4.0–5.6)

## 2023-06-06 MED ORDER — ROSUVASTATIN CALCIUM 40 MG PO TABS
40.0000 mg | ORAL_TABLET | Freq: Every day | ORAL | 3 refills | Status: DC
Start: 2023-06-06 — End: 2024-07-06

## 2023-06-06 MED ORDER — LISINOPRIL 5 MG PO TABS
5.0000 mg | ORAL_TABLET | Freq: Every day | ORAL | 3 refills | Status: DC
Start: 2023-06-06 — End: 2024-07-06

## 2023-06-06 NOTE — Progress Notes (Signed)
Complete physical exam  Patient: Karen Hicks   DOB: [birth year]   52 y.o. Female  MRN: [id number]  Subjective:      Karen Hicks is a 52 y.o. female who presents today for a complete physical exam. She reports consuming a low sodium and only wheat bread  diet. Home exercise routine includes yoga and walking and plexometrics. She generally feels very good. She reports sleeping well. She continues on [place].  Review of the record indicates she has lost roughly 16 pounds.  She does have migraine headaches and has these under good control.  She uses Nurtec and Imitrex.  Her gynecologist has her on Effexor which she has been working well.  She does have OSA however her AHI is 7.  She notes that she has improved since she is lost weight and does try to sleep on her side.  She does have some reflux symptoms and uses Nexium on an as-needed basis for that.  She does have EIA and does have an inhaler if she needs it.  Her allergies seem to be under good control.  She does have regular eye exams. She is in the process of potentially looking for a new job.  She is in a stable relationship.     Most recent fall risk assessment:    06/06/2023    3:16 PM  Fall Risk   Falls in the past year? 0  Number falls in past yr: 0  Injury with Fall? 0     Most recent depression screenings:    06/06/2023    3:16 PM 04/30/2022    8:40 AM  PHQ 2/9 Scores  PHQ - 2 Score 0 0    Vision:Appointment at the end of October 2024 and Dental: No regular dental care     Patient Care Team: Ronnald Nian, MD as PCP - General (Family Medicine) Quintella Reichert, MD as PCP - Cardiology (Cardiology) Drema Dallas, DO as Consulting Physician (Neurology)   Outpatient Medications Prior to Visit  Medication Sig   albuterol (VENTOLIN HFA) 108 (90 Base) MCG/ACT inhaler Inhale 2 puffs into the lungs every 6 (six) hours as needed for wheezing or shortness of breath.   cetirizine (ZYRTEC  ALLERGY) 10 MG tablet Take 1 tablet (10 mg total) by mouth daily.   Chlorpheniramine-Acetaminophen (CORICIDIN HBP COLD/FLU PO) Take by mouth as needed.   clobetasol (TEMOVATE) 0.05 % external solution Apply topically.   Colloidal Oatmeal (AVEENO ECZEMA THERAPY EX) Apply 1 application topically 2 (two) times daily.   dapagliflozin propanediol (FARXIGA) 10 MG TABS tablet Take 1 tablet (10 mg total) by mouth daily before breakfast.   esomeprazole (NEXIUM) 20 MG capsule Take 1 capsule (20 mg total) by mouth daily before breakfast. (Patient taking differently: Take 20 mg by mouth daily at 2 PM.)   glucose blood test strip Use as instructed   nitroGLYCERIN (NITROSTAT) 0.4 MG SL tablet Place 0.4 mg under the tongue as needed for chest pain.   ondansetron (ZOFRAN) 4 MG tablet Take 1 tablet (4 mg total) by mouth every 8 (eight) hours as needed for nausea or vomiting.   promethazine (PHENERGAN) 12.5 MG tablet Take 1-2 tablets (12.5-25 mg total) by mouth every 6 (six) hours as needed for nausea or vomiting.   Rimegepant Sulfate (NURTEC) 75 MG TBDP Take 1 tablet (75 mg total) by mouth daily as needed.   tirzepatide (MOUNJARO) 10 MG/0.5ML Pen Inject 10 mg into the skin once a week.  triamcinolone cream (KENALOG) 0.1 % SMARTSIG:1 Application Topical 2-3 Times Daily   venlafaxine (EFFEXOR) 37.5 MG tablet Take 37.5 mg by mouth at bedtime.    diphenhydrAMINE (BENADRYL) 25 MG tablet Take 25 mg by mouth daily as needed for allergies. (Patient not taking: Reported on 06/06/2023)   loratadine (CLARITIN) 10 MG tablet Take 10 mg by mouth daily as needed for allergies. (Patient not taking: Reported on 06/06/2023)   promethazine-dextromethorphan (PROMETHAZINE-DM) 6.25-15 MG/5ML syrup Take 5 mLs by mouth 3 (three) times daily as needed for cough. (Patient not taking: Reported on 06/06/2023)   pseudoephedrine (SUDAFED) 30 MG tablet Take 1 tablet (30 mg total) by mouth every 8 (eight) hours as needed for congestion. (Patient not  taking: Reported on 06/06/2023)   selenium sulfide (SELSUN BLUE) 1 % LOTN  (Patient not taking: Reported on 06/06/2023)   SUMAtriptan (IMITREX) 6 MG/0.5ML SOLN injection Inject 0.5 mLs (6 mg total) into the skin as needed for migraine or headache. May repeat in 1 hour if headache persists or recurs.  Maximum 2 injections in 24 hours.AutoInjector (Patient not taking: Reported on 06/06/2023)   [DISCONTINUED] lisinopril (ZESTRIL) 5 MG tablet Take 1 tablet (5 mg total) by mouth daily.   [DISCONTINUED] nitroGLYCERIN (NITRODUR - DOSED IN MG/24 HR) 0.1 mg/hr patch Place onto the skin. (Patient not taking: Reported on 06/06/2023)   [DISCONTINUED] rosuvastatin (CRESTOR) 40 MG tablet Take 1 tablet (40 mg total) by mouth daily.   No facility-administered medications prior to visit.    Review of Systems  All other systems reviewed and are negative.         Objective:       Physical Exam  Alert and in no distress. Tympanic membranes and canals are normal. Pharyngeal area is normal. Neck is supple without adenopathy or thyromegaly. Cardiac exam shows a regular sinus rhythm without murmurs or gallops. Lungs are clear to auscultation. Open A1c is 6.3     Assessment & Plan:    Routine general medical examination at a health care facility  Type 2 diabetes mellitus without complication, without long-term current use of insulin (HCC) - Plan: lisinopril (ZESTRIL) 5 MG tablet, HgB A1c  Status post total left knee replacement  Sleep apnea, unspecified type  Obesity (BMI 30-39.9)  Hyperlipidemia associated with type 2 diabetes mellitus (HCC) - Plan: rosuvastatin (CRESTOR) 40 MG tablet, Lipid panel  Gastroesophageal reflux disease without esophagitis  Exercise-induced asthma  Non-seasonal allergic rhinitis due to pollen  Arthritis  Adverse effect of metformin, subsequent encounter  Intractable migraine without aura and without status migrainosus  Need for shingles vaccine - Plan: Zoster  Recombinant (Shingrix )  S/P TKR (total knee replacement), right  Immunization History  Administered Date(s) Administered   Influenza,inj,Quad PF,6+ Mos 06/12/2018, 07/06/2019, 05/30/2020, 04/30/2022   Influenza-Unspecified 09/22/2021   PFIZER(Purple Top)SARS-COV-2 Vaccination 09/29/2019, 10/10/2019, 07/05/2020, 12/15/2020   PNEUMOCOCCAL CONJUGATE-20 04/20/2021   Pfizer(Comirnaty)Fall Seasonal Vaccine 12 years and older 11/09/2022   Tdap 05/30/2020   Zoster Recombinant(Shingrix) 03/12/2023    Health Maintenance  Topic Date Due   HIV Screening  Never done   OPHTHALMOLOGY EXAM  03/05/2023   Zoster Vaccines- Shingrix (2 of 2) 05/07/2023   INFLUENZA VACCINE  12/02/2023 (Originally 04/04/2023)   HEMOGLOBIN A1C  09/12/2023   MAMMOGRAM  02/29/2024   Diabetic kidney evaluation - eGFR measurement  03/11/2024   Diabetic kidney evaluation - Urine ACR  03/11/2024   FOOT EXAM  06/05/2024   DTaP/Tdap/Td (2 - Td or Tdap) 05/30/2030   Colonoscopy  02/03/2031   COVID-19 Vaccine  Completed   Hepatitis C Screening  Completed   HPV VACCINES  Aged Out    Discussed health benefits of physical activity, and encouraged her to engage in regular exercise appropriate for her age and condition.  Problem List Items Addressed This Visit     Allergic rhinitis due to pollen   Arthritis   Exercise-induced asthma   GERD (gastroesophageal reflux disease)   Hyperlipidemia associated with type 2 diabetes mellitus (HCC)   Relevant Medications   rosuvastatin (CRESTOR) 40 MG tablet   lisinopril (ZESTRIL) 5 MG tablet   nitroGLYCERIN (NITROSTAT) 0.4 MG SL tablet   Other Relevant Orders   Lipid panel   Metformin adverse reaction   Migraine headache   Relevant Medications   rosuvastatin (CRESTOR) 40 MG tablet   lisinopril (ZESTRIL) 5 MG tablet   nitroGLYCERIN (NITROSTAT) 0.4 MG SL tablet   Obesity (BMI 30-39.9)   S/P TKR (total knee replacement), right   Sleep apnea   Status post total left knee  replacement   Type 2 diabetes mellitus without complications (HCC)   Relevant Medications   rosuvastatin (CRESTOR) 40 MG tablet   lisinopril (ZESTRIL) 5 MG tablet   Other Relevant Orders   HgB A1c   Other Visit Diagnoses     Routine general medical examination at a health care facility    -  Primary   Need for shingles vaccine       Relevant Orders   Zoster Recombinant (Shingrix )     I congratulated her on all the work that she has done and encouraged her to continue with her physical activity level and medication.  Recheck here in about 6 months.      Sharlot Gowda, MD

## 2023-06-06 NOTE — Telephone Encounter (Signed)
[facility] needs sleep study results for this patient. The results have not been released and only the information questions are uploaded in its place. Please advise. Fax number [phone].

## 2023-06-07 LAB — LIPID PANEL
Chol/HDL Ratio: 6.4 {ratio} — ABNORMAL HIGH (ref 0.0–4.4)
Cholesterol, Total: 257 mg/dL — ABNORMAL HIGH (ref 100–199)
HDL: 40 mg/dL (ref >39)
LDL Chol Calc (NIH): 185 mg/dL — ABNORMAL HIGH (ref 0–99)
Triglycerides: 170 mg/dL — ABNORMAL HIGH (ref 0–149)
VLDL Cholesterol Cal: 32 mg/dL (ref 5–40)

## 2023-06-07 MED ORDER — EZETIMIBE 10 MG PO TABS
10.0000 mg | ORAL_TABLET | Freq: Every day | ORAL | 3 refills | Status: DC
Start: 2023-06-07 — End: 2024-07-06

## 2023-06-07 NOTE — Addendum Note (Signed)
Addended by: Ronnald Nian on: 06/07/2023 12:08 PM   Modules accepted: Orders

## 2023-07-30 LAB — HM MAMMOGRAPHY

## 2023-08-06 ENCOUNTER — Telehealth: Payer: Self-pay | Admitting: Family Medicine

## 2023-08-06 MED ORDER — TIRZEPATIDE 10 MG/0.5ML ~~LOC~~ SOAJ: 10.0000 mg | SUBCUTANEOUS | 1 refills | Status: DC

## 2023-08-06 NOTE — Telephone Encounter (Signed)
Needs refill mounjaro sent to [facility]

## 2023-08-07 ENCOUNTER — Encounter: Payer: Self-pay | Admitting: Family Medicine

## 2023-08-29 LAB — HM DIABETES EYE EXAM

## 2023-09-05 ENCOUNTER — Ambulatory Visit: Payer: 59 | Admitting: Family Medicine

## 2023-09-05 ENCOUNTER — Encounter: Payer: Self-pay | Admitting: Family Medicine

## 2023-09-05 VITALS — BP 124/86 | HR 75 | Ht 66.5 in | Wt 197.6 lb

## 2023-09-05 DIAGNOSIS — E1169 Type 2 diabetes mellitus with other specified complication: Secondary | ICD-10-CM

## 2023-09-05 DIAGNOSIS — E119 Type 2 diabetes mellitus without complications: Secondary | ICD-10-CM

## 2023-09-05 DIAGNOSIS — E739 Lactose intolerance, unspecified: Secondary | ICD-10-CM

## 2023-09-05 DIAGNOSIS — E785 Hyperlipidemia, unspecified: Secondary | ICD-10-CM

## 2023-09-05 DIAGNOSIS — G43009 Migraine without aura, not intractable, without status migrainosus: Secondary | ICD-10-CM

## 2023-09-05 LAB — POCT GLYCOSYLATED HEMOGLOBIN (HGB A1C): Hemoglobin A1C: 6.3 % — AB (ref 4.0–5.6)

## 2023-09-05 NOTE — Progress Notes (Signed)
 Subjective:    Patient ID: Karen Hicks, female    DOB: Feb 18, 1971, 53 y.o.   MRN: [id number]  Karen Hicks is a 53 y.o. female who presents for follow-up of Type 2 diabetes mellitus.  Patient is checking home blood sugars.   Home blood sugar records: BGs range between 84 and 220, BGs are high in the morning How often is blood sugars being checked: every 3 days  Current symptoms/problems include none and have been stable. Daily foot checks: yes   Any foot concerns: none Last eye exam: December 2024 Exercise: Home exercise routine includes walking 3 hrs per week and chair aerobics. Continues on Mounjaro  and Farxiga .  She continues to do well on this.  She does note that she does have lactose intolerance and knows to avoid certain foods.  She is also taking Zetia  and Crestor  and having no difficulty with that.  Continues on low-dose lisinopril .  She also states that she seems to be doing better overall and not having as much difficulty with headaches.  Also of note she has switched jobs and is now at [employer] and is enjoying that.  Her boyfriend is apparently near her but they do not live together. The following portions of the patient's history were reviewed and updated as appropriate: allergies, current medications, past medical history, past social history and problem list.  ROS as in subjective above.     Objective:    Physical Exam Alert and in no distress otherwise not examined. Hemoglobin A1c 6.3. Blood pressure 124/86, pulse 75, height 5' 6.5 (1.689 m), weight 197 lb 9.6 oz (89.6 kg), SpO2 96%.  Lab Review    Latest Ref Rng & Units 09/05/2023   10:56 AM 06/06/2023    4:48 PM 06/06/2023    4:03 PM 03/12/2023   11:49 AM 03/12/2023    9:57 AM  Diabetic Labs  HbA1c 4.0 - 5.6 % 6.3  6.3      Microalbumin mg/L    <5.0    Micro/Creat Ratio     <4.2    Chol 100 - 199 mg/dL   742   674   HDL >60 mg/dL   40   44   Calc LDL 0 - 99 mg/dL   814   786    Triglycerides 0 - 149 mg/dL   829   669   Creatinine 0.57 - 1.00 mg/dL     9.15       04/09/7973   10:11 AM 06/06/2023    3:17 PM 05/19/2023    3:17 PM 04/01/2023    9:08 AM 03/12/2023    9:21 AM  BP/Weight  Systolic BP 124 118 162 131 114  Diastolic BP 86 72 89 82 82  Wt. (Lbs) 197.6 190.8  193 192.4  BMI 31.42 kg/m2 30.11 kg/m2  31.15 kg/m2 31.05 kg/m2      Latest Ref Rng & Units 08/29/2023    2:47 PM 06/06/2023    3:30 PM  Foot/eye exam completion dates  Eye Exam No Retinopathy No Retinopathy       Foot Form Completion   Done     This result is from an external source.    Karen Hicks  reports that she has never smoked. She has never been exposed to tobacco smoke. She has never used smokeless tobacco. She reports that she does not currently use alcohol. She reports that she does not use drugs.     Assessment & Plan:    Type 2  diabetes mellitus without complication, without long-term current use of insulin (HCC) - Plan: POCT glycosylated hemoglobin (Hb A1C)  Hyperlipidemia associated with type 2 diabetes mellitus (HCC)  Lactose intolerance  Migraine without aura and without status migrainosus, not intractable  Discussed possibly increasing her Mounjaro  however she is comfortable with where she is at right now in terms of her weight.  She knows to avoid certain foods in terms of the lactose issue.  Her migraines are causing much less problems and she thinks it is related to the effect of the Mounjaro .  Check here in 6 months.

## 2023-10-04 NOTE — Progress Notes (Deleted)
 NEUROLOGY FOLLOW UP OFFICE NOTE  Karen Hicks [id number]  Assessment/Plan:   Migraine with aura, without status migrainosus, not intractable   Migraine prevention:  Not indicated. *** Migraine rescue:  Nurtec; sumatriptan 6mg  [place] for when she wakes up with migraine and for second line to Nurtec if needed. *** Limit use of pain relievers to no more than 2 days out of week to prevent risk of rebound or medication-overuse headache. Keep headache diary Follow up ***       Subjective:  Karen Hicks is a 53 year old female with sleep apnea, arthritis, fatty liver, and history of PE (1994 related to surgery) who follows up for migraines.  UPDATE: *** Intensity:  moderate to severe Duration:  within an hour *** Frequency:  once every 2 months (severe every 3 months) ***  Current NSAIDS/analgesics: meloxicam Current triptans:  sumatriptan 6mg  [place] Current ergotamine:  none Current anti-emetic:  ondansetron 4mg , promethazine 12.5-25mg  Current muscle relaxants:  none Current Antihypertensive medications:  lisinopril Current Antidepressant medications:  venlafaxine 37.5mg  QHS Current Anticonvulsant medications:  none Current anti-CGRP:  Nurtec (rescue) Current Vitamins/Herbal/Supplements:  nnone Current Antihistamines/Decongestants:  Benadryl, loradiine Other therapy:  none Hormone/birth control:  none     Caffeine:  No coffee.  Sometimes tea Alcohol:  no Smoker:  no Diet:  Hydrates.  Watches diet Exercise:  walking, swimming, stairs or low-impact weight training 3 to 4 times a week Depression:  no; Anxiety:  no Other pain:  bilateral knee and shoulder pain - h/o surgeries Sleep hygiene:  Sleeps 7 hours.  Improved.  Has sleep apnea.  Still waiting on CPAP.    HISTORY:  Onset:  53 years old Location:  used to be back of head.  Now they are across forehead, periorbital region and temples Quality:  pounding Intensity:  10/10.   Aura:  sees spots and  haze/wavy lines in her vision Prodrome:  absent Associated symptoms:  Nausea, vomiting, lightheadedness, photophobia, phonophobia, osmophobia.  She denies associated unilateral numbness or weakness. Duration:  Usually 1 hour with Nurtec but at times may last from 2-3 hours up to 2-3 days (severe intractable migraine requiring a shot occurs about 3 times a year) Frequency:  used to be every 4 months, then last year once a month (2 severe in 6 months).  However she hasn't had a migraine since May.  She said she did change jobs which helped.   Triggers:  red or blue drinks (Kool Aid, Gatorade, [place]), chocolate, emotional stress Relieving factors:  ice pack, laying down on a cold floor. Activity:  aggravates   Past NSAIDS/analgesics:  acetaminophen, Excedrin Migraine, ibuprofen, naproxen, tramadol, oxycodone Past abortive triptans:  sumatriptan 100mg , frovatriptan, naratriptan, zolmitriptan tab/NS Past abortive ergotamine:  Trudhesa Past muscle relaxants:  baclofen, methocarbamol Past anti-emetic:  none Past antihypertensive medications:  none Past antidepressant medications:  amitriptyline Past anticonvulsant medications:  topiramate Past anti-CGRP:  none Past vitamins/Herbal/Supplements:  turmeric Past antihistamines/decongestants:  Sudafed Other past therapies:  none    Family history of headache:  mom, dad, grandparents, brothers, sisters, aunts, uncles.  PAST MEDICAL HISTORY: Past Medical History:  Diagnosis Date   Allergy    Anemia    when she was young   Arthritis    Complication of anesthesia    takes her alittle longer to wake up   GERD (gastroesophageal reflux disease)    Irritable bowel    Lactose intolerance    Migraine    PE (pulmonary embolism)    in 1994  meniscus repair.  Saw Dr. Susann Givens   Pneumonia    PONV (postoperative nausea and vomiting)    Pre-diabetes    Restless legs syndrome    Sleep apnea    2007.  Uses the mask & machine when she needs it.  Dr.  Susann Givens is aware    MEDICATIONS: Current Outpatient Medications on File Prior to Visit  Medication Sig Dispense Refill   albuterol (VENTOLIN HFA) 108 (90 Base) MCG/ACT inhaler Inhale 2 puffs into the lungs every 6 (six) hours as needed for wheezing or shortness of breath. 8 g 0   cetirizine (ZYRTEC ALLERGY) 10 MG tablet Take 1 tablet (10 mg total) by mouth daily. 30 tablet 0   Chlorpheniramine-Acetaminophen (CORICIDIN HBP COLD/FLU PO) Take by mouth as needed.     clobetasol (TEMOVATE) 0.05 % external solution Apply topically.     Colloidal Oatmeal (AVEENO ECZEMA THERAPY EX) Apply 1 application topically 2 (two) times daily.     dapagliflozin propanediol (FARXIGA) 10 MG TABS tablet Take 1 tablet (10 mg total) by mouth daily before breakfast. 90 tablet 1   diphenhydrAMINE (BENADRYL) 25 MG tablet Take 25 mg by mouth daily as needed for allergies. (Patient not taking: Reported on 06/06/2023)     esomeprazole (NEXIUM) 20 MG capsule Take 1 capsule (20 mg total) by mouth daily before breakfast. (Patient taking differently: Take 20 mg by mouth daily at 2 PM.) 30 capsule 11   ezetimibe (ZETIA) 10 MG tablet Take 1 tablet (10 mg total) by mouth daily. 90 tablet 3   glucose blood test strip Use as instructed 100 each 12   lisinopril (ZESTRIL) 5 MG tablet Take 1 tablet (5 mg total) by mouth daily. 90 tablet 3   loratadine (CLARITIN) 10 MG tablet Take 10 mg by mouth daily as needed for allergies.     nitroGLYCERIN (NITROSTAT) 0.4 MG SL tablet Place 0.4 mg under the tongue as needed for chest pain. (Patient not taking: Reported on 09/05/2023)     ondansetron (ZOFRAN) 4 MG tablet Take 1 tablet (4 mg total) by mouth every 8 (eight) hours as needed for nausea or vomiting. 20 tablet 0   promethazine (PHENERGAN) 12.5 MG tablet Take 1-2 tablets (12.5-25 mg total) by mouth every 6 (six) hours as needed for nausea or vomiting. (Patient not taking: Reported on 09/05/2023) 30 tablet 0   promethazine-dextromethorphan  (PROMETHAZINE-DM) 6.25-15 MG/5ML syrup Take 5 mLs by mouth 3 (three) times daily as needed for cough. 200 mL 0   pseudoephedrine (SUDAFED) 30 MG tablet Take 1 tablet (30 mg total) by mouth every 8 (eight) hours as needed for congestion. (Patient not taking: Reported on 09/05/2023) 30 tablet 0   Rimegepant Sulfate (NURTEC) 75 MG TBDP Take 1 tablet (75 mg total) by mouth daily as needed. 10 tablet 1   rosuvastatin (CRESTOR) 40 MG tablet Take 1 tablet (40 mg total) by mouth daily. 90 tablet 3   selenium sulfide (SELSUN BLUE) 1 % LOTN      SUMAtriptan (IMITREX) 6 MG/0.5ML SOLN injection Inject 0.5 mLs (6 mg total) into the skin as needed for migraine or headache. May repeat in 1 hour if headache persists or recurs.  Maximum 2 injections in 24 hours.AutoInjector 7.5 mL 11   tirzepatide (MOUNJARO) 10 MG/0.5ML Pen Inject 10 mg into the skin once a week. 6 mL 1   triamcinolone cream (KENALOG) 0.1 % SMARTSIG:1 Application Topical 2-3 Times Daily     venlafaxine (EFFEXOR) 37.5 MG tablet Take 37.5 mg by  mouth at bedtime.      No current facility-administered medications on file prior to visit.    ALLERGIES: Allergies  Allergen Reactions   Mushroom Extract Complex (Do Not Select) Anaphylaxis   Peach Flavoring Agent (Non-Screening) Anaphylaxis   Adhesive [Tape] Hives    Paper tape only   Penicillins Hives, Rash and Other (See Comments)    Tingling and shaking PATIENT HAS HAD A PCN REACTION WITH IMMEDIATE RASH, FACIAL/TONGUE/THROAT SWELLING, SOB, OR LIGHTHEADEDNESS WITH HYPOTENSION:  #  #  YES  #  #  HAS PT DEVELOPED SEVERE RASH INVOLVING MUCUS MEMBRANES or SKIN NECROSIS: #  #  YES  #  #  Has patient had a PCN reaction that required hospitalization No Has patient had a PCN reaction occurring within the last 10 years: #  #  #  YES  #  #  #     Latex Itching    FAMILY HISTORY: Family History  Problem Relation Age of Onset   Arthritis Mother    Diabetes Mother    Heart disease Mother    Hypertension  Mother    Kidney disease Mother    Stroke Mother    Gallbladder disease Mother    Glaucoma Mother    Seizures Mother    Migraines Mother    Arthritis Father    Diabetes Father    Hypertension Father    Kidney disease Father    Gallbladder disease Father    Migraines Sister    Migraines Brother    Healthy Son       Objective:  *** General: No acute distress.  Patient appears well-groomed.   ***     Shon Millet, DO  CC: Sharlot Gowda, MD

## 2023-10-07 ENCOUNTER — Ambulatory Visit: Payer: Medicaid Other | Admitting: Neurology

## 2023-10-16 ENCOUNTER — Ambulatory Visit (INDEPENDENT_AMBULATORY_CARE_PROVIDER_SITE_OTHER): Payer: 59

## 2023-10-16 ENCOUNTER — Ambulatory Visit
Admission: EM | Admit: 2023-10-16 | Discharge: 2023-10-16 | Disposition: A | Discharge status: Home / Self Care | Payer: 59 | Attending: Emergency Medicine | Admitting: Emergency Medicine

## 2023-10-16 DIAGNOSIS — J45901 Unspecified asthma with (acute) exacerbation: Secondary | ICD-10-CM | POA: Diagnosis not present

## 2023-10-16 DIAGNOSIS — R0602 Shortness of breath: Secondary | ICD-10-CM | POA: Diagnosis not present

## 2023-10-16 DIAGNOSIS — R058 Other specified cough: Secondary | ICD-10-CM | POA: Diagnosis not present

## 2023-10-16 MED ORDER — PREDNISONE 10 MG PO TABS: 40.0000 mg | ORAL_TABLET | Freq: Every day | ORAL | 0 refills | Status: AC

## 2023-10-16 MED ORDER — AZITHROMYCIN 250 MG PO TABS: 250.0000 mg | ORAL_TABLET | Freq: Every day | ORAL | 0 refills | Status: DC

## 2023-10-16 NOTE — ED Triage Notes (Signed)
Patient to Urgent Care with complaints of productive cough (green/ yellow mucus)/ wheezing/ fatigue. Possible low grade fevers.  Nose bleed this morning. Covid positive two weeks ago. Symptoms worse the last three days.   Meds: promethazine- DM/ albuterol (feels like she needs it every 2 hours) requests refill.

## 2023-10-16 NOTE — Discharge Instructions (Addendum)
Use your albuterol inhaler as directed.  Take the prednisone and Zithromax as directed.  Follow up with your primary care provider tomorrow.  Go to the emergency department if you have worsening symptoms.

## 2023-10-16 NOTE — ED Provider Notes (Signed)
Karen Hicks    CSN: [id number] Arrival date & time: 10/16/23  1633      History   Chief Complaint Chief Complaint  Patient presents with   Cough   Wheezing    HPI Karen Hicks is a 53 y.o. female.  Patient presents with 2-week history of productive cough, wheezing, intermittent shortness of breath, fatigue.  No known fever.  No chest pain.  She reports positive COVID test at home 2 weeks ago.  She has been using her albuterol inhaler.  Her medical history includes exercise-induced asthma, allergies, migraine headache, pulmonary embolism in 1994.  The history is provided by the patient and medical records.    Past Medical History:  Diagnosis Date   Allergy    Anemia    when she was young   Arthritis    Complication of anesthesia    takes her alittle longer to wake up   GERD (gastroesophageal reflux disease)    Irritable bowel    Lactose intolerance    Migraine    PE (pulmonary embolism)    in 1994 meniscus repair.  Saw Dr. Susann Givens   Pneumonia    PONV (postoperative nausea and vomiting)    Pre-diabetes    Restless legs syndrome    Sleep apnea    2007.  Uses the mask & machine when she needs it.  Dr. Susann Givens is aware    Patient Active Problem List   Diagnosis Date Noted   Metformin adverse reaction 03/12/2023   Hot flashes 04/20/2021   Exercise-induced asthma 04/20/2021   Hyperlipidemia associated with type 2 diabetes mellitus (HCC) 10/06/2019   Type 2 diabetes mellitus without complications (HCC) 02/21/2018   Chest pain 10/25/2017   S/P TKR (total knee replacement), right 07/31/2016   Status post total left knee replacement 12/14/2014   Arthritis 07/21/2014   GERD (gastroesophageal reflux disease) 06/12/2011   Sleep apnea 06/12/2011   Obesity (BMI 30-39.9) 06/12/2011   Allergic rhinitis due to pollen 06/12/2011   Migraine headache 06/12/2011    Past Surgical History:  Procedure Laterality Date   CESAREAN SECTION     HARDWARE REMOVAL  Left 08/03/2014   Procedure: HARDWARE REMOVAL;  Surgeon: Velna Ochs, MD;  Location: [facility] OR;  Service: Orthopedics;  Laterality: Left;   HARDWARE REMOVAL Right 07/31/2016   Procedure: HARDWARE REMOVAL;  Surgeon: Marcene Corning, MD;  Location: [facility] OR;  Service: Orthopedics;  Laterality: Right;   KNEE SURGERY Bilateral    multiple   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  07/19/2014   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      Home Medications    Prior to Admission medications   Medication Sig Start Date End Date Taking? Authorizing Provider  azithromycin (ZITHROMAX) 250 MG tablet Take 1 tablet (250 mg total) by mouth daily. Take first 2 tablets together, then 1 every  day until finished. 10/16/23  Yes Mickie Bail, NP  predniSONE (DELTASONE) 10 MG tablet Take 4 tablets (40 mg total) by mouth daily for 5 days. 10/16/23 10/21/23 Yes Mickie Bail, NP  albuterol (VENTOLIN HFA) 108 (90 Base) MCG/ACT inhaler Inhale 2 puffs into the lungs every 6 (six) hours as needed for wheezing or shortness of breath. 04/20/21   Ronnald Nian, MD  cetirizine (ZYRTEC ALLERGY) 10 MG tablet Take 1 tablet (10 mg total) by mouth daily. 05/19/23   Wallis Bamberg, PA-C  Chlorpheniramine-Acetaminophen (CORICIDIN HBP COLD/FLU PO) Take by mouth as needed. Patient not taking: Reported on 10/16/2023    [provider]   clobetasol (TEMOVATE) 0.05 % external solution Apply topically. 10/20/20   [provider]  Colloidal Oatmeal (AVEENO ECZEMA THERAPY EX) Apply 1 application topically 2 (two) times daily.    [provider]  dapagliflozin propanediol (FARXIGA) 10 MG TABS tablet Take 1 tablet (10 mg total) by mouth daily before breakfast. 03/12/23   Ronnald Nian, MD  diphenhydrAMINE (BENADRYL) 25 MG tablet Take 25 mg by mouth daily as needed for allergies. Patient not taking: Reported on 06/06/2023    [provider]  esomeprazole (NEXIUM) 20 MG capsule Take 1 capsule (20 mg total) by mouth daily before breakfast. Patient taking differently: Take 20 mg by mouth daily at 2 PM. 06/10/13   Ronnald Nian, MD  ezetimibe (ZETIA) 10 MG tablet Take 1 tablet (10 mg total) by mouth daily. 06/07/23   Ronnald Nian, MD  glucose blood test strip Use as instructed 01/21/18   Ronnald Nian, MD  lisinopril (ZESTRIL) 5 MG tablet Take 1 tablet (5 mg total) by mouth daily. 06/06/23   Ronnald Nian, MD  loratadine (CLARITIN) 10 MG tablet Take 10 mg by mouth daily as needed for allergies.    [provider]  nitroGLYCERIN (NITROSTAT) 0.4 MG SL tablet Place 0.4 mg under the tongue as needed for chest pain. Patient not taking: Reported on 09/05/2023    [provider]  ondansetron (ZOFRAN) 4 MG tablet Take 1 tablet (4 mg total) by mouth every 8 (eight) hours as needed for nausea or vomiting. 11/23/21   Jake Shark, PA-C  promethazine (PHENERGAN) 12.5 MG tablet Take 1-2 tablets (12.5-25 mg total) by mouth every 6 (six) hours as needed for nausea or vomiting. Patient not taking: Reported on 09/05/2023 08/19/18   Elodia Florence, PA-C  promethazine-dextromethorphan (PROMETHAZINE-DM) 6.25-15 MG/5ML syrup Take 5 mLs by mouth 3 (three) times daily as needed for cough. 05/19/23   Wallis Bamberg, PA-C  pseudoephedrine (SUDAFED) 30 MG tablet Take 1 tablet (30 mg total) by mouth every 8 (eight) hours as  needed for congestion. Patient not taking: Reported on 09/05/2023 05/19/23   Wallis Bamberg, PA-C  Rimegepant Sulfate (NURTEC) 75 MG TBDP Take 1 tablet (75 mg total) by mouth daily as needed. 03/12/23   Ronnald Nian, MD  rosuvastatin (CRESTOR) 40 MG tablet Take 1 tablet (40 mg total) by mouth daily. 06/06/23   Ronnald Nian, MD  selenium sulfide (SELSUN BLUE) 1 % LOTN  10/20/20   [provider]  SUMAtriptan (IMITREX) 6 MG/0.5ML SOLN injection Inject 0.5 mLs (6 mg total) into the skin as needed for migraine or headache. May repeat in 1 hour if headache persists or recurs.  Maximum 2 injections in 24 hours.AutoInjector 04/01/23   Everlena Cooper, Adam R, DO  tirzepatide [facility]) 10 MG/0.5ML Pen Inject 10 mg into the skin once  a week. 08/06/23   Ronnald Nian, MD  triamcinolone cream (KENALOG) 0.1 % SMARTSIG:1 Application Topical 2-3 Times Daily 05/12/21   [provider]  venlafaxine (EFFEXOR) 37.5 MG tablet Take 37.5 mg by mouth at bedtime.  12/16/19   [provider]    Family History Family History  Problem Relation Age of Onset   Arthritis Mother    Diabetes Mother    Heart disease Mother    Hypertension Mother    Kidney disease Mother    Stroke Mother    Gallbladder disease Mother    Glaucoma Mother    Seizures Mother    Migraines Mother    Arthritis Father    Diabetes Father    Hypertension Father    Kidney disease Father    Gallbladder disease Father    Migraines Sister    Migraines Brother    Healthy Son     Social History Social History   Tobacco Use   Smoking status: Never    Passive exposure: Never   Smokeless tobacco: Never  Vaping Use   Vaping status: Never Used  Substance Use Topics   Alcohol use: Not Currently   Drug use: No     Allergies   Mushroom extract complex (obsolete), Peach flavoring agent (non-screening), Adhesive [tape], Penicillins, and Latex   Review of Systems Review of Systems  Constitutional:  Positive for fatigue.  Negative for chills and fever.  HENT:  Positive for congestion. Negative for ear pain and sore throat.   Respiratory:  Positive for cough, shortness of breath and wheezing.   Cardiovascular:  Negative for chest pain and palpitations.     Physical Exam Triage Vital Signs ED Triage Vitals  Encounter Vitals Group     BP 10/16/23 1646 133/89     Systolic BP Percentile --      Diastolic BP Percentile --      Pulse Rate 10/16/23 1646 94     Resp 10/16/23 1646 18     Temp 10/16/23 1646 98.5 F (36.9 C)     Temp src --      SpO2 10/16/23 1646 98 %     Weight --      Height --      Head Circumference --      Peak Flow --      Pain Score 10/16/23 1648 7     Pain Loc --      Pain Education --      Exclude from Growth Chart --    No data found.  Updated Vital Signs BP 133/89   Pulse 94   Temp 98.5 F (36.9 C)   Resp 18   SpO2 98%   Visual Acuity Right Eye Distance:   Left Eye Distance:   Bilateral Distance:    Right Eye Near:   Left Eye Near:    Bilateral Near:     Physical Exam Constitutional:      General: She is not in acute distress. HENT:     Right Ear: Tympanic membrane normal.     Left Ear: Tympanic membrane normal.     Nose: Nose normal.     Mouth/Throat:     Mouth: Mucous membranes are moist.     Pharynx: Oropharynx is clear.  Cardiovascular:     Rate and Rhythm: Normal rate and regular rhythm.     Heart sounds: Normal heart sounds.  Pulmonary:     Effort: Pulmonary effort is normal. No respiratory distress.  Breath sounds: Normal breath sounds.  Neurological:     Mental Status: She is alert.      UC Treatments / Results  Labs (all labs ordered are listed, but only abnormal results are displayed) Labs Reviewed - No data to display  EKG   Radiology DG Chest 2 View Result Date: 10/16/2023 CLINICAL DATA:  Productive cough and shortness of breath. COVID 2 weeks ago. EXAM: CHEST - 2 VIEW COMPARISON:  03/23/2020 FINDINGS: The cardiomediastinal  contours are normal. The lungs are clear. Pulmonary vasculature is normal. No consolidation, pleural effusion, or pneumothorax. No acute osseous abnormalities are seen. IMPRESSION: No active cardiopulmonary disease. Electronically Signed   By: Narda Rutherford M.D.   On: 10/16/2023 18:05    Procedures Procedures (including critical care time)  Medications Ordered in UC Medications - No data to display  Initial Impression / Assessment and Plan / UC Course  I have reviewed the triage vital signs and the nursing notes.  Pertinent labs & imaging results that were available during my care of the patient were reviewed by me and considered in my medical decision making (see chart for details).   Productive cough, shortness of breath, asthma exacerbation.  O2 sat 98% on room air.  CXR negative.  Instructed patient to continue using her albuterol inhaler.  Treating today with prednisone and Zithromax.  Did her to follow-up with her PCP tomorrow.  ED precautions given.  Education provided on asthma and shortness of breath.  Patient agrees to plan of care.    Final Clinical Impressions(s) / UC Diagnoses   Final diagnoses:  Productive cough  SOB (shortness of breath)  Asthma with acute exacerbation, unspecified asthma severity, unspecified whether persistent     Discharge Instructions      Use your albuterol inhaler as directed.  Take the prednisone and Zithromax as directed.  Follow up with your primary care provider tomorrow.  Go to the emergency department if you have worsening symptoms.        ED Prescriptions     Medication Sig Dispense Auth. Provider   azithromycin (ZITHROMAX) 250 MG tablet Take 1 tablet (250 mg total) by mouth daily. Take first 2 tablets together, then 1 every day until finished. 6 tablet Mickie Bail, NP   predniSONE (DELTASONE) 10 MG tablet Take 4 tablets (40 mg total) by mouth daily for 5 days. 20 tablet Mickie Bail, NP      PDMP not reviewed this  encounter.   Mickie Bail, NP 10/16/23 [phone]

## 2023-10-23 ENCOUNTER — Encounter: Payer: Self-pay | Admitting: Internal Medicine

## 2023-10-23 NOTE — Telephone Encounter (Signed)
Spoke with patient and she said no she did not want to switch. Told patient she had too because dr. Susann Givens was not going to be doing physical after August per Lafonda Mosses and pt said she will talk to you about this at her July appointment.

## 2023-10-29 ENCOUNTER — Encounter: Payer: Self-pay | Admitting: Internal Medicine

## 2023-11-05 NOTE — Progress Notes (Deleted)
 NEUROLOGY FOLLOW UP OFFICE NOTE  Karen Hicks [id number]  Assessment/Plan:   Migraine with aura, without status migrainosus, not intractable   Migraine prevention:  Not indicated. *** Migraine rescue:  Nurtec; sumatriptan 6mg  [place] for when she wakes up with migraine and for second line to Nurtec if needed. *** Limit use of pain relievers to no more than 2 days out of week to prevent risk of rebound or medication-overuse headache. Keep headache diary Follow up ***       Subjective:  Karen Hicks is a 53 year old female with sleep apnea, arthritis, fatty liver, and history of PE (1994 related to surgery) who follows up for migraines.  UPDATE: *** Intensity:  moderate to severe Duration:  within an hour *** Frequency:  once every 2 months (severe every 3 months) ***  Current NSAIDS/analgesics: meloxicam Current triptans:  sumatriptan 6mg  [place] Current ergotamine:  none Current anti-emetic:  ondansetron 4mg , promethazine 12.5-25mg  Current muscle relaxants:  none Current Antihypertensive medications:  lisinopril Current Antidepressant medications:  venlafaxine 37.5mg  [facility] Current Anticonvulsant medications:  none Current anti-CGRP:  Nurtec (rescue) Current Vitamins/Herbal/Supplements:  nnone Current Antihistamines/Decongestants:  Benadryl, loradiine Other therapy:  none Hormone/birth control:  none     Caffeine:  No coffee.  Sometimes tea Alcohol:  no Smoker:  no Diet:  Hydrates.  Watches diet Exercise:  walking, swimming, stairs or low-impact weight training 3 to 4 times a week Depression:  no; Anxiety:  no Other pain:  bilateral knee and shoulder pain - h/o surgeries Sleep hygiene:  Sleeps 7 hours.  Improved.  Has sleep apnea.  Still waiting on CPAP.    HISTORY:  Onset:  53 years old Location:  used to be back of head.  Now they are across forehead, periorbital region and temples Quality:  pounding Intensity:  10/10.   Aura:  sees spots and  haze/wavy lines in her vision Prodrome:  absent Associated symptoms:  Nausea, vomiting, lightheadedness, photophobia, phonophobia, osmophobia.  She denies associated unilateral numbness or weakness. Duration:  Usually 1 hour with Nurtec but at times may last from 2-3 hours up to 2-3 days (severe intractable migraine requiring a shot occurs about 3 times a year) Frequency:  used to be every 4 months, then last year once a month (2 severe in 6 months).  However she hasn't had a migraine since May.  She said she did change jobs which helped.   Triggers:  red or blue drinks (Kool Aid, Gatorade, [place]), chocolate, emotional stress Relieving factors:  ice pack, laying down on a cold floor. Activity:  aggravates   Past NSAIDS/analgesics:  acetaminophen, Excedrin Migraine, ibuprofen, naproxen, tramadol, oxycodone Past abortive triptans:  sumatriptan 100mg , frovatriptan, naratriptan, zolmitriptan tab/NS Past abortive ergotamine:  Trudhesa Past muscle relaxants:  baclofen, methocarbamol Past anti-emetic:  none Past antihypertensive medications:  none Past antidepressant medications:  amitriptyline Past anticonvulsant medications:  topiramate Past anti-CGRP:  none Past vitamins/Herbal/Supplements:  turmeric Past antihistamines/decongestants:  Sudafed Other past therapies:  none    Family history of headache:  mom, dad, grandparents, brothers, sisters, aunts, uncles.  PAST MEDICAL HISTORY: Past Medical History:  Diagnosis Date   Allergy    Anemia    when she was young   Arthritis    Complication of anesthesia    takes her alittle longer to wake up   GERD (gastroesophageal reflux disease)    Irritable bowel    Lactose intolerance    Migraine    PE (pulmonary embolism)    in 1994  meniscus repair.  Saw Dr. Susann Givens   Pneumonia    PONV (postoperative nausea and vomiting)    Pre-diabetes    Restless legs syndrome    Sleep apnea    2007.  Uses the mask & machine when she needs it.  Dr.  Susann Givens is aware    MEDICATIONS: Current Outpatient Medications on File Prior to Visit  Medication Sig Dispense Refill   albuterol (VENTOLIN HFA) 108 (90 Base) MCG/ACT inhaler Inhale 2 puffs into the lungs every 6 (six) hours as needed for wheezing or shortness of breath. 8 g 0   azithromycin (ZITHROMAX) 250 MG tablet Take 1 tablet (250 mg total) by mouth daily. Take first 2 tablets together, then 1 every day until finished. 6 tablet 0   cetirizine (ZYRTEC ALLERGY) 10 MG tablet Take 1 tablet (10 mg total) by mouth daily. 30 tablet 0   Chlorpheniramine-Acetaminophen (CORICIDIN HBP COLD/FLU PO) Take by mouth as needed. (Patient not taking: Reported on 10/16/2023)     clobetasol (TEMOVATE) 0.05 % external solution Apply topically.     Colloidal Oatmeal (AVEENO ECZEMA THERAPY EX) Apply 1 application topically 2 (two) times daily.     dapagliflozin propanediol (FARXIGA) 10 MG TABS tablet Take 1 tablet (10 mg total) by mouth daily before breakfast. 90 tablet 1   diphenhydrAMINE (BENADRYL) 25 MG tablet Take 25 mg by mouth daily as needed for allergies. (Patient not taking: Reported on 06/06/2023)     esomeprazole (NEXIUM) 20 MG capsule Take 1 capsule (20 mg total) by mouth daily before breakfast. (Patient taking differently: Take 20 mg by mouth daily at 2 PM.) 30 capsule 11   ezetimibe (ZETIA) 10 MG tablet Take 1 tablet (10 mg total) by mouth daily. 90 tablet 3   glucose blood test strip Use as instructed 100 each 12   lisinopril (ZESTRIL) 5 MG tablet Take 1 tablet (5 mg total) by mouth daily. 90 tablet 3   loratadine (CLARITIN) 10 MG tablet Take 10 mg by mouth daily as needed for allergies.     nitroGLYCERIN (NITROSTAT) 0.4 MG SL tablet Place 0.4 mg under the tongue as needed for chest pain. (Patient not taking: Reported on 09/05/2023)     ondansetron (ZOFRAN) 4 MG tablet Take 1 tablet (4 mg total) by mouth every 8 (eight) hours as needed for nausea or vomiting. 20 tablet 0   promethazine (PHENERGAN) 12.5  MG tablet Take 1-2 tablets (12.5-25 mg total) by mouth every 6 (six) hours as needed for nausea or vomiting. (Patient not taking: Reported on 09/05/2023) 30 tablet 0   promethazine-dextromethorphan (PROMETHAZINE-DM) 6.25-15 MG/5ML syrup Take 5 mLs by mouth 3 (three) times daily as needed for cough. 200 mL 0   pseudoephedrine (SUDAFED) 30 MG tablet Take 1 tablet (30 mg total) by mouth every 8 (eight) hours as needed for congestion. (Patient not taking: Reported on 09/05/2023) 30 tablet 0   Rimegepant Sulfate (NURTEC) 75 MG TBDP Take 1 tablet (75 mg total) by mouth daily as needed. 10 tablet 1   rosuvastatin (CRESTOR) 40 MG tablet Take 1 tablet (40 mg total) by mouth daily. 90 tablet 3   selenium sulfide (SELSUN BLUE) 1 % LOTN      SUMAtriptan (IMITREX) 6 MG/0.5ML SOLN injection Inject 0.5 mLs (6 mg total) into the skin as needed for migraine or headache. May repeat in 1 hour if headache persists or recurs.  Maximum 2 injections in 24 hours.AutoInjector 7.5 mL 11   tirzepatide (MOUNJARO) 10 MG/0.5ML Pen Inject 10  mg into the skin once a week. 6 mL 1   triamcinolone cream (KENALOG) 0.1 % SMARTSIG:1 Application Topical 2-3 Times Daily     venlafaxine (EFFEXOR) 37.5 MG tablet Take 37.5 mg by mouth at bedtime.      No current facility-administered medications on file prior to visit.    ALLERGIES: Allergies  Allergen Reactions   Mushroom Extract Complex (Obsolete) Anaphylaxis   Peach Flavoring Agent (Non-Screening) Anaphylaxis   Adhesive [Tape] Hives    Paper tape only   Penicillins Hives, Rash and Other (See Comments)    Tingling and shaking PATIENT HAS HAD A PCN REACTION WITH IMMEDIATE RASH, FACIAL/TONGUE/THROAT SWELLING, SOB, OR LIGHTHEADEDNESS WITH HYPOTENSION:  #  #  YES  #  #  HAS PT DEVELOPED SEVERE RASH INVOLVING MUCUS MEMBRANES or SKIN NECROSIS: #  #  YES  #  #  Has patient had a PCN reaction that required hospitalization No Has patient had a PCN reaction occurring within the last 10 years: #   #  #  YES  #  #  #     Latex Itching    FAMILY HISTORY: Family History  Problem Relation Age of Onset   Arthritis Mother    Diabetes Mother    Heart disease Mother    Hypertension Mother    Kidney disease Mother    Stroke Mother    Gallbladder disease Mother    Glaucoma Mother    Seizures Mother    Migraines Mother    Arthritis Father    Diabetes Father    Hypertension Father    Kidney disease Father    Gallbladder disease Father    Migraines Sister    Migraines Brother    Healthy Son       Objective:  *** General: No acute distress.  Patient appears well-groomed.   ***     Shon Millet, DO  CC: Sharlot Gowda, MD

## 2023-11-06 ENCOUNTER — Ambulatory Visit: Payer: Medicaid Other | Admitting: Neurology

## 2023-11-06 ENCOUNTER — Encounter: Payer: Self-pay | Admitting: Neurology

## 2024-01-14 ENCOUNTER — Other Ambulatory Visit: Payer: Self-pay | Admitting: Family Medicine

## 2024-03-04 ENCOUNTER — Other Ambulatory Visit: Payer: Self-pay | Admitting: Family Medicine

## 2024-03-04 MED ORDER — MOUNJARO 10 MG/0.5ML ~~LOC~~ SOAJ: 10.0000 mg | SUBCUTANEOUS | 0 refills | Status: DC

## 2024-03-04 NOTE — Telephone Encounter (Signed)
 Copied from CRM [id number]. Topic: Clinical - Medication Refill >> Mar 04, 2024 10:42 AM Graeme ORN wrote: Medication: MOUNJARO  10 MG/0.5ML Pen - dose was due Sunday (missed)  Has the patient contacted their pharmacy? Yes (Agent: If no, request that the patient contact the pharmacy for the refill. If patient does not wish to contact the pharmacy document the reason why and proceed with request.) (Agent: If yes, when and what did the pharmacy advise?) submitted request two weeks ago it was denied  This is the patient's preferred pharmacy:   [facility] [place] [place] [address] [place] [address] Phone: [phone] Fax: [phone]  Is this the correct pharmacy for this prescription? Yes If no, delete pharmacy and type the correct one.   Has the prescription been filled recently? Yes  Is the patient out of the medication? Yes  Has the patient been seen for an appointment in the last year OR does the patient have an upcoming appointment? Yes  Can we respond through MyChart? No  Agent: Please be advised that Rx refills may take up to 3 business days. We ask that you follow-up with your pharmacy.

## 2024-03-10 ENCOUNTER — Encounter: Payer: Self-pay | Admitting: Family Medicine

## 2024-03-10 ENCOUNTER — Ambulatory Visit: Payer: 59 | Admitting: Family Medicine

## 2024-03-10 VITALS — BP 134/80 | HR 86 | Wt 207.8 lb

## 2024-03-10 DIAGNOSIS — E1169 Type 2 diabetes mellitus with other specified complication: Secondary | ICD-10-CM

## 2024-03-10 DIAGNOSIS — J45990 Exercise induced bronchospasm: Secondary | ICD-10-CM

## 2024-03-10 DIAGNOSIS — Z96652 Presence of left artificial knee joint: Secondary | ICD-10-CM

## 2024-03-10 DIAGNOSIS — E785 Hyperlipidemia, unspecified: Secondary | ICD-10-CM

## 2024-03-10 DIAGNOSIS — E669 Obesity, unspecified: Secondary | ICD-10-CM

## 2024-03-10 DIAGNOSIS — E119 Type 2 diabetes mellitus without complications: Secondary | ICD-10-CM

## 2024-03-10 LAB — POCT GLYCOSYLATED HEMOGLOBIN (HGB A1C): Hemoglobin A1C: 6.6 % — AB (ref 4.0–5.6)

## 2024-03-10 MED ORDER — ALBUTEROL SULFATE HFA 108 (90 BASE) MCG/ACT IN AERS
2.0000 | INHALATION_SPRAY | Freq: Four times a day (QID) | RESPIRATORY_TRACT | 0 refills | Status: AC | PRN
Start: 2024-03-10 — End: ?

## 2024-03-10 MED ORDER — TIRZEPATIDE 12.5 MG/0.5ML ~~LOC~~ SOAJ: 12.5000 mg | SUBCUTANEOUS | 1 refills | Status: DC

## 2024-03-10 NOTE — Progress Notes (Signed)
   Subjective:    Patient ID: Kenneth Needs, female    DOB: 31-Oct-1970, 53 y.o.   MRN: [id number]  HPI She is here for recheck on her diabetes.  She is on Mounjaro  and does note a decrease in food noise and as well as having earlier than before satiety.  She continues on Zetia , lisinopril  and Crestor .  She has had a knee replacement and seems to doing well with that.  Has induced asthma but treats this appropriately.   Review of Systems     Objective:    Physical Exam Alert and in no distress.  Hemoglobin A1c is 6.6.       Assessment & Plan:  Type 2 diabetes mellitus without complication, without long-term current use of insulin (HCC) - Plan: tirzepatide  (MOUNJARO ) 12.5 MG/0.5ML Pen, HgB A1c  Exercise-induced asthma - Plan: albuterol  (VENTOLIN  HFA) 108 (90 Base) MCG/ACT inhaler  Status post total left knee replacement  Obesity (BMI 30-39.9)  Hyperlipidemia associated with type 2 diabetes mellitus (HCC) Although she is okay with her present way to get a good idea to try and get lower so I will give her a higher dose of the Mounjaro  to see if that will help with her weight loss.  Follow-up in several months.

## 2024-03-16 NOTE — Progress Notes (Unsigned)
 Ben Jackson [facility] [address] [place] [address] Phone: [phone]   Assessment and Plan:     There are no diagnoses linked to this encounter.  ***   Pertinent previous records reviewed include ***    Follow Up: ***     Subjective:   I, Chanie Soucek, am serving as a Neurosurgeon for Doctor Morene Mace   Chief Complaint:rib  dysfunction    HPI:    03/09/2022 Patient is a 53 year old female complaining of rib dysfunction. Patient states went to ER 01/01/2022  Pain waxes and wanes with no particular pattern and does radiate to the right upper quadrant.   She denies any constipation or diarrhea.  She denies any urinary urgency, frequency, tenesmus, dysuria.  In the past, shortness of breath decreased ROM under breast to back been going on since may , states she has  fatty liver and no stones in her gallbladder ,was seen by dalldorf and he recommended OMT.  Receives temporary relief with NSAIDs, but tries not to use them frequently.   03/26/2022 Patient states that she not to bad the back is feeling better still has a little annoyance from the rib under the breast on right side, is able to walk and sleep through the night    12/03/2022 Patient states that she is in left sided rib pain flare    03/17/2024 Patient states   Relevant Historical Information: Fatty liver, DM type II, bilateral knee replacement  Additional pertinent review of systems negative.   Current Outpatient Medications:    albuterol  (VENTOLIN  HFA) 108 (90 Base) MCG/ACT inhaler, Inhale 2 puffs into the lungs every 6 (six) hours as needed for wheezing or shortness of breath., Disp: 8 g, Rfl: 0   azithromycin  (ZITHROMAX ) 250 MG tablet, Take 1 tablet (250 mg total) by mouth daily. Take first 2 tablets together, then 1 every day until finished. (Patient not taking: Reported on 03/10/2024), Disp: 6 tablet, Rfl: 0   cetirizine  (ZYRTEC  ALLERGY) 10 MG tablet, Take 1 tablet  (10 mg total) by mouth daily., Disp: 30 tablet, Rfl: 0   Chlorpheniramine-Acetaminophen  (CORICIDIN HBP COLD/FLU PO), Take by mouth as needed. (Patient not taking: Reported on 03/10/2024), Disp: , Rfl:    clobetasol (TEMOVATE) 0.05 % external solution, Apply topically., Disp: , Rfl:    Colloidal Oatmeal (AVEENO ECZEMA THERAPY EX), Apply 1 application topically 2 (two) times daily., Disp: , Rfl:    dapagliflozin  propanediol (FARXIGA ) 10 MG TABS tablet, Take 1 tablet (10 mg total) by mouth daily before breakfast., Disp: 90 tablet, Rfl: 1   diphenhydrAMINE  (BENADRYL ) 25 MG tablet, Take 25 mg by mouth daily as needed for allergies., Disp: , Rfl:    esomeprazole  (NEXIUM ) 20 MG capsule, Take 1 capsule (20 mg total) by mouth daily before breakfast., Disp: 30 capsule, Rfl: 11   ezetimibe  (ZETIA ) 10 MG tablet, Take 1 tablet (10 mg total) by mouth daily., Disp: 90 tablet, Rfl: 3   glucose blood test strip, Use as instructed, Disp: 100 each, Rfl: 12   lisinopril  (ZESTRIL ) 5 MG tablet, Take 1 tablet (5 mg total) by mouth daily., Disp: 90 tablet, Rfl: 3   loratadine  (CLARITIN ) 10 MG tablet, Take 10 mg by mouth daily as needed for allergies., Disp: , Rfl:    nitroGLYCERIN  (NITROSTAT ) 0.4 MG SL tablet, Place 0.4 mg under the tongue as needed for chest pain. (Patient not taking: Reported on 03/10/2024), Disp: , Rfl:    ondansetron  (  ZOFRAN ) 4 MG tablet, Take 1 tablet (4 mg total) by mouth every 8 (eight) hours as needed for nausea or vomiting., Disp: 20 tablet, Rfl: 0   promethazine  (PHENERGAN ) 12.5 MG tablet, Take 1-2 tablets (12.5-25 mg total) by mouth every 6 (six) hours as needed for nausea or vomiting., Disp: 30 tablet, Rfl: 0   promethazine -dextromethorphan (PROMETHAZINE -DM) 6.25-15 MG/5ML syrup, Take 5 mLs by mouth 3 (three) times daily as needed for cough., Disp: 200 mL, Rfl: 0   pseudoephedrine  (SUDAFED) 30 MG tablet, Take 1 tablet (30 mg total) by mouth every 8 (eight) hours as needed for congestion., Disp: 30  tablet, Rfl: 0   Rimegepant Sulfate (NURTEC) 75 MG TBDP, Take 1 tablet (75 mg total) by mouth daily as needed., Disp: 10 tablet, Rfl: 1   rosuvastatin  (CRESTOR ) 40 MG tablet, Take 1 tablet (40 mg total) by mouth daily., Disp: 90 tablet, Rfl: 3   selenium sulfide (SELSUN BLUE) 1 % LOTN, , Disp: , Rfl:    SUMAtriptan  (IMITREX ) 6 MG/0.5ML SOLN injection, Inject 0.5 mLs (6 mg total) into the skin as needed for migraine or headache. May repeat in 1 hour if headache persists or recurs.  Maximum 2 injections in 24 hours.AutoInjector, Disp: 7.5 mL, Rfl: 11   tirzepatide  (MOUNJARO ) 12.5 MG/0.5ML Pen, Inject 12.5 mg into the skin once a week., Disp: 6 mL, Rfl: 1   triamcinolone cream (KENALOG) 0.1 %, SMARTSIG:1 Application Topical 2-3 Times Daily, Disp: , Rfl:    venlafaxine (EFFEXOR) 37.5 MG tablet, Take 37.5 mg by mouth at bedtime. , Disp: , Rfl:    Objective:     There were no vitals filed for this visit.    There is no height or weight on file to calculate BMI.    Physical Exam:    ***   Electronically signed by:  Odis Mace [facility] 12:50 PM 03/16/24

## 2024-03-17 ENCOUNTER — Ambulatory Visit: Admitting: Sports Medicine

## 2024-03-17 VITALS — BP 130/84 | HR 86 | Ht 66.0 in | Wt 207.0 lb

## 2024-03-17 DIAGNOSIS — M9903 Segmental and somatic dysfunction of lumbar region: Secondary | ICD-10-CM

## 2024-03-17 DIAGNOSIS — M5442 Lumbago with sciatica, left side: Secondary | ICD-10-CM | POA: Diagnosis not present

## 2024-03-17 DIAGNOSIS — M9908 Segmental and somatic dysfunction of rib cage: Secondary | ICD-10-CM

## 2024-03-17 DIAGNOSIS — M9905 Segmental and somatic dysfunction of pelvic region: Secondary | ICD-10-CM | POA: Diagnosis not present

## 2024-03-17 DIAGNOSIS — M9902 Segmental and somatic dysfunction of thoracic region: Secondary | ICD-10-CM | POA: Diagnosis not present

## 2024-03-17 DIAGNOSIS — M5441 Lumbago with sciatica, right side: Secondary | ICD-10-CM

## 2024-03-17 DIAGNOSIS — G8929 Other chronic pain: Secondary | ICD-10-CM

## 2024-03-17 DIAGNOSIS — M9904 Segmental and somatic dysfunction of sacral region: Secondary | ICD-10-CM

## 2024-03-17 MED ORDER — TIZANIDINE HCL 4 MG PO TABS: 4.0000 mg | ORAL_TABLET | Freq: Every evening | ORAL | 0 refills | Status: AC | PRN

## 2024-03-17 MED ORDER — MELOXICAM 15 MG PO TABS: 15.0000 mg | ORAL_TABLET | Freq: Every day | ORAL | 0 refills | Status: AC

## 2024-03-17 NOTE — Patient Instructions (Signed)
-   Start meloxicam  15 mg daily x2 weeks.  If still having pain after 2 weeks, complete 3rd-week of NSAID. May use remaining NSAID as needed once daily for pain control.  Do not to use additional over-the-counter NSAIDs (ibuprofen, naproxen , Advil, Aleve , etc.) while taking prescription NSAIDs.  May use Tylenol  [phone] mg 2 to 3 times a day for breakthrough pain. Tizanidine  4-8 mg nightly as needed  Restart back HEP  2 week follow up

## 2024-04-01 NOTE — Progress Notes (Unsigned)
 Karen Jackson [facility] [address] [place] [address] Phone: [phone]   Assessment and Plan:     There are no diagnoses linked to this encounter.  *** - Patient has received relief with OMT in the past.  Elects for repeat OMT today.  Tolerated well per note below. - Decision today to treat with OMT was based on Physical Exam   After verbal consent patient was treated with HVLA (high velocity low amplitude), ME (muscle energy), FPR (flex positional release), ST (soft tissue), PC/PD (Pelvic Compression/ Pelvic Decompression) techniques in cervical, rib, thoracic, lumbar, and pelvic areas. Patient tolerated the procedure well with improvement in symptoms.  Patient educated on potential side effects of soreness and recommended to rest, hydrate, and use Tylenol  as needed for pain control.   Pertinent previous records reviewed include ***    Follow Up: ***     Subjective:   I, Kodie Kishi, am serving as a Neurosurgeon for Doctor Morene Mace   Chief Complaint:rib  dysfunction    HPI:    03/09/2022 Patient is a 53 year old female complaining of rib dysfunction. Patient states went to ER 01/01/2022  Pain waxes and wanes with no particular pattern and does radiate to the right upper quadrant.   She denies any constipation or diarrhea.  She denies any urinary urgency, frequency, tenesmus, dysuria.  In the past, shortness of breath decreased ROM under breast to back been going on since may , states she has  fatty liver and no stones in her gallbladder ,was seen by dalldorf and he recommended OMT.  Receives temporary relief with NSAIDs, but tries not to use them frequently.   03/26/2022 Patient states that she not to bad the back is feeling better still has a little annoyance from the rib under the breast on right side, is able to walk and sleep through the night    12/03/2022 Patient states that she is in left sided rib pain flare    03/17/2024 Patient states  Saturday morning she was cleaning out a garage and lifted something heavy. Thinks she threw out her rib and low back again.   04/02/2024 Patient states   Relevant Historical Information: Fatty liver, DM type II, bilateral knee replacement  Additional pertinent review of systems negative.  Current Outpatient Medications  Medication Sig Dispense Refill   albuterol  (VENTOLIN  HFA) 108 (90 Base) MCG/ACT inhaler Inhale 2 puffs into the lungs every 6 (six) hours as needed for wheezing or shortness of breath. 8 g 0   azithromycin  (ZITHROMAX ) 250 MG tablet Take 1 tablet (250 mg total) by mouth daily. Take first 2 tablets together, then 1 every day until finished. (Patient not taking: Reported on 03/10/2024) 6 tablet 0   cetirizine  (ZYRTEC  ALLERGY) 10 MG tablet Take 1 tablet (10 mg total) by mouth daily. 30 tablet 0   Chlorpheniramine-Acetaminophen  (CORICIDIN HBP COLD/FLU PO) Take by mouth as needed. (Patient not taking: Reported on 03/10/2024)     clobetasol (TEMOVATE) 0.05 % external solution Apply topically.     Colloidal Oatmeal (AVEENO ECZEMA THERAPY EX) Apply 1 application topically 2 (two) times daily.     dapagliflozin  propanediol (FARXIGA ) 10 MG TABS tablet Take 1 tablet (10 mg total) by mouth daily before breakfast. 90 tablet 1   diphenhydrAMINE  (BENADRYL ) 25 MG tablet Take 25 mg by mouth daily as needed for allergies.     esomeprazole  (NEXIUM ) 20 MG capsule Take 1 capsule (20 mg total) by mouth daily  before breakfast. 30 capsule 11   ezetimibe  (ZETIA ) 10 MG tablet Take 1 tablet (10 mg total) by mouth daily. 90 tablet 3   glucose blood test strip Use as instructed 100 each 12   lisinopril  (ZESTRIL ) 5 MG tablet Take 1 tablet (5 mg total) by mouth daily. 90 tablet 3   loratadine  (CLARITIN ) 10 MG tablet Take 10 mg by mouth daily as needed for allergies.     meloxicam  (MOBIC ) 15 MG tablet Take 1 tablet (15 mg total) by mouth daily. 30 tablet 0   nitroGLYCERIN  (NITROSTAT ) 0.4 MG SL tablet Place 0.4 mg  under the tongue as needed for chest pain. (Patient not taking: Reported on 03/10/2024)     ondansetron  (ZOFRAN ) 4 MG tablet Take 1 tablet (4 mg total) by mouth every 8 (eight) hours as needed for nausea or vomiting. 20 tablet 0   promethazine  (PHENERGAN ) 12.5 MG tablet Take 1-2 tablets (12.5-25 mg total) by mouth every 6 (six) hours as needed for nausea or vomiting. 30 tablet 0   promethazine -dextromethorphan (PROMETHAZINE -DM) 6.25-15 MG/5ML syrup Take 5 mLs by mouth 3 (three) times daily as needed for cough. 200 mL 0   pseudoephedrine  (SUDAFED) 30 MG tablet Take 1 tablet (30 mg total) by mouth every 8 (eight) hours as needed for congestion. 30 tablet 0   Rimegepant Sulfate (NURTEC) 75 MG TBDP Take 1 tablet (75 mg total) by mouth daily as needed. 10 tablet 1   rosuvastatin  (CRESTOR ) 40 MG tablet Take 1 tablet (40 mg total) by mouth daily. 90 tablet 3   selenium sulfide (SELSUN BLUE) 1 % LOTN      SUMAtriptan  (IMITREX ) 6 MG/0.5ML SOLN injection Inject 0.5 mLs (6 mg total) into the skin as needed for migraine or headache. May repeat in 1 hour if headache persists or recurs.  Maximum 2 injections in 24 hours.AutoInjector 7.5 mL 11   tirzepatide  (MOUNJARO ) 12.5 MG/0.5ML Pen Inject 12.5 mg into the skin once a week. 6 mL 1   tiZANidine  (ZANAFLEX ) 4 MG tablet Take 1 tablet (4 mg total) by mouth at bedtime as needed. 30 tablet 0   triamcinolone cream (KENALOG) 0.1 % SMARTSIG:1 Application Topical 2-3 Times Daily     venlafaxine (EFFEXOR) 37.5 MG tablet Take 37.5 mg by mouth at bedtime.      No current facility-administered medications for this visit.      Objective:     There were no vitals filed for this visit.    There is no height or weight on file to calculate BMI.    Physical Exam:     General: Well-appearing, cooperative, sitting comfortably in no acute distress.   OMT Physical Exam:  ASIS Compression Test: Positive Right Cervical: TTP paraspinal, *** Rib: Bilateral elevated first rib  with TTP Thoracic: TTP paraspinal,*** Lumbar: TTP paraspinal,*** Pelvis: Right anterior innominate  Electronically signed by:  Odis Mace [facility] 7:34 AM 04/01/24

## 2024-04-02 ENCOUNTER — Ambulatory Visit: Admitting: Sports Medicine

## 2024-04-02 VITALS — HR 95 | Ht 66.0 in | Wt 202.0 lb

## 2024-04-02 DIAGNOSIS — M9905 Segmental and somatic dysfunction of pelvic region: Secondary | ICD-10-CM

## 2024-04-02 DIAGNOSIS — M5442 Lumbago with sciatica, left side: Secondary | ICD-10-CM | POA: Diagnosis not present

## 2024-04-02 DIAGNOSIS — M9903 Segmental and somatic dysfunction of lumbar region: Secondary | ICD-10-CM

## 2024-04-02 DIAGNOSIS — M9902 Segmental and somatic dysfunction of thoracic region: Secondary | ICD-10-CM

## 2024-04-02 DIAGNOSIS — M9904 Segmental and somatic dysfunction of sacral region: Secondary | ICD-10-CM

## 2024-04-02 DIAGNOSIS — M9908 Segmental and somatic dysfunction of rib cage: Secondary | ICD-10-CM

## 2024-04-02 DIAGNOSIS — M5441 Lumbago with sciatica, right side: Secondary | ICD-10-CM

## 2024-04-02 DIAGNOSIS — G8929 Other chronic pain: Secondary | ICD-10-CM

## 2024-04-07 ENCOUNTER — Other Ambulatory Visit: Payer: Self-pay | Admitting: Medical

## 2024-04-07 DIAGNOSIS — G43009 Migraine without aura, not intractable, without status migrainosus: Secondary | ICD-10-CM

## 2024-04-08 ENCOUNTER — Other Ambulatory Visit: Payer: Self-pay | Admitting: Sports Medicine

## 2024-04-09 ENCOUNTER — Ambulatory Visit: Attending: Cardiology | Admitting: Cardiology

## 2024-04-09 ENCOUNTER — Encounter: Payer: Self-pay | Admitting: Cardiology

## 2024-04-09 VITALS — BP 134/98 | HR 80 | Ht 66.0 in | Wt 205.0 lb

## 2024-04-09 DIAGNOSIS — R072 Precordial pain: Secondary | ICD-10-CM | POA: Insufficient documentation

## 2024-04-09 DIAGNOSIS — E785 Hyperlipidemia, unspecified: Secondary | ICD-10-CM

## 2024-04-09 DIAGNOSIS — E1169 Type 2 diabetes mellitus with other specified complication: Secondary | ICD-10-CM

## 2024-04-09 DIAGNOSIS — Z8249 Family history of ischemic heart disease and other diseases of the circulatory system: Secondary | ICD-10-CM | POA: Diagnosis not present

## 2024-04-09 DIAGNOSIS — R0609 Other forms of dyspnea: Secondary | ICD-10-CM

## 2024-04-09 DIAGNOSIS — E1159 Type 2 diabetes mellitus with other circulatory complications: Secondary | ICD-10-CM | POA: Insufficient documentation

## 2024-04-09 DIAGNOSIS — R079 Chest pain, unspecified: Secondary | ICD-10-CM | POA: Diagnosis not present

## 2024-04-09 DIAGNOSIS — G4733 Obstructive sleep apnea (adult) (pediatric): Secondary | ICD-10-CM

## 2024-04-09 DIAGNOSIS — I10 Essential (primary) hypertension: Secondary | ICD-10-CM | POA: Insufficient documentation

## 2024-04-09 MED ORDER — ASPIRIN 81 MG PO TBEC: 81.0000 mg | DELAYED_RELEASE_TABLET | Freq: Every day | ORAL | Status: AC

## 2024-04-09 MED ORDER — METOPROLOL TARTRATE 100 MG PO TABS: ORAL_TABLET | ORAL | 0 refills | Status: DC

## 2024-04-09 NOTE — Progress Notes (Unsigned)
 Cardiology Consultation:    Date:  04/09/2024   ID:  Karen Hicks, DOB 21-Oct-1970, MRN [id number]  PCP:  Joyce Norleen BROCKS, MD  Cardiologist:  Lamar Fitch, MD   Referring MD: Joyce Norleen BROCKS, MD   Chief Complaint  Patient presents with   Chest Pain    History of Present Illness:    Karen Hicks is a 53 y.o. female who is being seen today for the evaluation of chest pain at the request of Joyce Norleen BROCKS, MD. past medical history significant for essential hypertension, diabetes, dyslipidemia, family history of premature coronary artery disease she was referred to us  because of episode of chest pain.  She was evaluated couple years ago for atypical chest pain, stress echocardiogram has been done which was negative.  However she developed new complaints she described pain as tightness in the chest that happen sometimes when she walks sometimes when she exercise sometimes when she sits and does nothing.  Last for few minutes no sweating no radiation no dizziness associated with this sensation.  She does have numerous risk factors for coronary artery disease there is diabetes likely excellently controlled with hemoglobin A1c of 6.6, she does have dyslipidemia in spite of high dose of high intensity statin Crestor  her LDL in October was 185, Zetia  has been added after that, likely she never smoked but she does have multiple family members having premature coronary artery disease.  She does have history of sleep apnea however she says she lost significant amount of weight and she does not need anymore CPAP mask.  She is trying to stick with good diet, will control her weight as well as exercise on the regular basis but slow down because of symptoms as described above  Past Medical History:  Diagnosis Date   Allergy    Anemia    when she was young   Arthritis    Complication of anesthesia    takes her alittle longer to wake up   GERD (gastroesophageal reflux disease)     Irritable bowel    Lactose intolerance    Migraine    PE (pulmonary embolism)    in 1994 meniscus repair.  Saw Dr. LaLonde   Pneumonia    PONV (postoperative nausea and vomiting)    Pre-diabetes    Restless legs syndrome    Sleep apnea    2007.  Uses the mask & machine when she Hicks it.  Dr. 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  07/19/2014   right knee moderate DJD with multiple surgeries  07/19/2014   right wrist arthroscopy  2009   TOTAL ABDOMINAL HYSTERECTOMY  01/2006   FIBROIDS   TOTAL KNEE ARTHROPLASTY Left 08/03/2014   dr herald   TOTAL KNEE ARTHROPLASTY Left 08/03/2014   Procedure: LEFT TOTAL KNEE ARTHROPLASTY;  Surgeon: Maude KANDICE Herald, MD;  Location: [facility] OR;  Service: Orthopedics;  Laterality: Left;   TOTAL KNEE ARTHROPLASTY Right 07/31/2016   Procedure: TOTAL KNEE ARTHROPLASTY;  Surgeon: Maude Herald, MD;  Location:  [facility] OR;  Service: Orthopedics;  Laterality: Right;   UPPER GI ENDOSCOPY  02/18/2020    Current Medications: Current Meds  Medication Sig   albuterol  (VENTOLIN  HFA) 108 (90 Base) MCG/ACT inhaler Inhale 2 puffs into the lungs every 6 (six) hours as needed for wheezing or shortness of breath.   cetirizine  (ZYRTEC  ALLERGY) 10 MG tablet Take 1 tablet (10 mg total) by mouth daily.   clobetasol (TEMOVATE) 0.05 % external solution Apply topically.   Colloidal Oatmeal (AVEENO ECZEMA THERAPY EX) Apply 1 application topically 2 (two) times daily.   dapagliflozin  propanediol (FARXIGA ) 10 MG TABS tablet Take 1  tablet (10 mg total) by mouth daily before breakfast.   diphenhydrAMINE  (BENADRYL ) 25 MG tablet Take 25 mg by mouth daily as needed for allergies.   esomeprazole  (NEXIUM ) 20 MG capsule Take 1 capsule (20 mg total) by mouth daily before breakfast.   ezetimibe  (ZETIA ) 10 MG tablet Take 1 tablet (10 mg total) by mouth daily.   glucose blood test strip Use as instructed   lisinopril  (ZESTRIL ) 5 MG tablet Take 1 tablet (5 mg total) by mouth daily.   loratadine  (CLARITIN ) 10 MG tablet Take 10 mg by mouth daily as needed for allergies.   meloxicam  (MOBIC ) 15 MG tablet Take 1 tablet (15 mg total) by mouth daily.   nitroGLYCERIN  (NITROSTAT ) 0.4 MG SL tablet Place 0.4 mg under the tongue as needed for chest pain.   ondansetron  (ZOFRAN ) 4 MG tablet Take 1 tablet (4 mg total) by mouth every 8 (eight) hours as needed for nausea or vomiting.   promethazine  (PHENERGAN ) 12.5 MG tablet Take 1-2 tablets (12.5-25 mg total) by mouth every 6 (six) hours as needed for nausea or vomiting.   pseudoephedrine  (SUDAFED) 30 MG tablet Take 1 tablet (30 mg total) by mouth every 8 (eight) hours as needed for congestion.   Rimegepant Sulfate (NURTEC) 75 MG TBDP TAKE 1 TABLET (75 MG TOTAL) BY MOUTH DAILY AS NEEDED   rosuvastatin  (CRESTOR ) 40 MG tablet Take 1 tablet (40 mg total) by mouth daily.   selenium sulfide (SELSUN BLUE) 1 % LOTN    tirzepatide  (MOUNJARO ) 12.5 MG/0.5ML Pen Inject 12.5 mg into the skin once a week.   tiZANidine  (ZANAFLEX ) 4 MG tablet Take 1 tablet (4 mg total) by mouth at bedtime as needed.   triamcinolone cream (KENALOG) 0.1 % SMARTSIG:1 Application Topical 2-3 Times Daily   venlafaxine (EFFEXOR) 37.5 MG tablet Take 37.5 mg by mouth at bedtime.      Allergies:   Mushroom extract complex (obsolete), Peach flavoring agent (non-screening), Adhesive [tape], Penicillins, and Latex   Social History   Socioeconomic History   Marital status: Single    Spouse name: Not on file   Number of children: Not on  file   Years of education: Not on file   Highest education level: Not on file  Occupational History   Not on file  Tobacco Use   Smoking status: Never    Passive exposure: Never   Smokeless tobacco: Never  Vaping Use   Vaping status: Never Used  Substance and Sexual Activity   Alcohol use: Not Currently   Drug use: No   Sexual activity: Not on file  Other Topics Concern   Not on file  Social History Narrative   Lives with son in a one story home.  Works as a Loss adjuster, chartered at [employer].  Education: Masters   Caffeine rarely   Right handed   Social Drivers of Health  Financial Resource Strain: Low Risk  (06/06/2023)   Overall Financial Resource Strain (CARDIA)    Difficulty of Paying Living Expenses: Not very hard  Food Insecurity: No Food Insecurity (06/06/2023)   Hunger Vital Sign    Worried About Running Out of Food in the Last Year: Never true    Ran Out of Food in the Last Year: Never true  Transportation Hicks: No Transportation Hicks (06/06/2023)   PRAPARE - Administrator, Civil Service (Medical): No    Lack of Transportation (Non-Medical): No  Physical Activity: Insufficiently Active (06/06/2023)   Exercise Vital Sign    Days of Exercise per Week: 4 days    Minutes of Exercise per Session: 30 min  Stress: No Stress Concern Present (06/06/2023)   [employer] of Occupational Health - Occupational Stress Questionnaire    Feeling of Stress : Only a little  Social Connections: Moderately Integrated (06/06/2023)   Social Connection and Isolation Panel    Frequency of Communication with Friends and Family: More than three times a week    Frequency of Social Gatherings with Friends and Family: Twice a week    Attends Religious Services: More than 4 times per year    Active Member of [employer] or Organizations: Yes    Attends Engineer, structural: More than 4 times per year    Marital Status: Divorced     Family History: The patient's  family history includes Arthritis in her father and mother; Diabetes in her father and mother; Gallbladder disease in her father and mother; Glaucoma in her mother; Healthy in her son; Heart disease in her mother; Hypertension in her father and mother; Kidney disease in her father and mother; Migraines in her brother, mother, and sister; Seizures in her mother; Stroke in her mother. ROS:   Please see the history of present illness.    All 14 point review of systems negative except as described per history of present illness.  EKGs/Labs/Other Studies Reviewed:    The following studies were reviewed today:   EKG:       Recent Labs: No results found for requested labs within last 365 days.  Recent Lipid Panel    Component Value Date/Time   CHOL 257 (H) 06/06/2023 1603   TRIG 170 (H) 06/06/2023 1603   HDL 40 06/06/2023 1603   CHOLHDL 6.4 (H) 06/06/2023 1603   CHOLHDL 6.9 06/10/2013 1413   VLDL 47 (H) 06/10/2013 1413   LDLCALC 185 (H) 06/06/2023 1603    Physical Exam:    VS:  BP (!) 134/98   Pulse 80   Ht 5' 6 (1.676 m)   Wt 205 lb (93 kg)   SpO2 97%   BMI 33.09 kg/m     Wt Readings from Last 3 Encounters:  04/09/24 205 lb (93 kg)  04/02/24 202 lb (91.6 kg)  03/17/24 207 lb (93.9 kg)     GEN:  Well nourished, well developed in no acute distress HEENT: Normal NECK: No JVD; No carotid bruits LYMPHATICS: No lymphadenopathy CARDIAC: RRR, no murmurs, no rubs, no gallops RESPIRATORY:  Clear to auscultation without rales, wheezing or rhonchi  ABDOMEN: Soft, non-tender, non-distended MUSCULOSKELETAL:  No edema; No deformity  SKIN: Warm and dry NEUROLOGIC:  Alert and oriented x 3 PSYCHIATRIC:  Normal affect   ASSESSMENT:    1. Chest pain, unspecified type   2. Precordial chest pain   3. Family history of coronary arteriosclerosis   4. Hyperlipidemia associated with type 2 diabetes  mellitus (HCC)   5. Obstructive sleep apnea syndrome   6. Essential hypertension     PLAN:    In order of problems listed above:  Chest pain in this lady with multiple risk factors of coronary artery disease we had a long discussion what to do with the situation I think the best option is to perform coronary CT angio I explained procedure to her including all benefits coming from it as compared to stress testing.  She agreed to proceed. History of pulmonary emboli she did have years ago when she got knee surgery.  I will schedule her to have echocardiogram to assess left ventricle ejection fraction as well as look at pulmonary pressure. Suspicious for atherosclerosis and coronary artery disease.  Ask her to start taking 1 baby aspirin  every single day. Dyslipidemia clearly not well-controlled.  Fasting lipid profile need to be done which I will do, anticipate need to initiate PCSK9 agent. We did talk about need to exercise on the regular basis as well as good diet but asked her to take it easy until we do those test   Medication Adjustments/Labs and Tests Ordered: Current medicines are reviewed at length with the patient today.  Concerns regarding medicines are outlined above.  Orders Placed This Encounter  Procedures   EKG 12-Lead   No orders of the defined types were placed in this encounter.   Signed, Lamar DOROTHA Fitch, MD, [facility]. 04/09/2024 9:41 AM    [facility]

## 2024-04-09 NOTE — Patient Instructions (Addendum)
 Medication Instructions:   START: Aspirin  81mg  1 tablet daily  TAKE: Metoprolol  100mg  1 tablet 2 hours prior to CT scan   Lab Work: Lipid, AST, Alt, BMP- today If you have labs (blood work) drawn today and your tests are completely normal, you will receive your results only by: MyChart Message (if you have MyChart) OR A paper copy in the mail If you have any lab test that is abnormal or we need to change your treatment, we will call you to review the results.   Testing/Procedures:  Your physician has requested that you have an echocardiogram. Echocardiography is a painless test that uses sound waves to create images of your heart. It provides your doctor with information about the size and shape of your heart and how well your heart's chambers and valves are working. This procedure takes approximately one hour. There are no restrictions for this procedure. Please do NOT wear cologne, perfume, aftershave, or lotions (deodorant is allowed). Please arrive 15 minutes prior to your appointment time.  Please note: We ask at that you not bring children with you during ultrasound (echo/ vascular) testing. Due to room size and safety concerns, children are not allowed in the ultrasound rooms during exams. Our front office staff cannot provide observation of children in our lobby area while testing is being conducted. An adult accompanying a patient to their appointment will only be allowed in the ultrasound room at the discretion of the ultrasound technician under special circumstances. We apologize for any inconvenience.   Your cardiac CT will be scheduled at one of the below locations:   [facility] [address] [place], [place] [address]  Please follow these instructions carefully (unless otherwise directed):  Hold all erectile dysfunction medications at least 3 days (72 hrs) prior to test.  On the Night Before the Test: Be sure to Drink plenty of water. Do not consume any  caffeinated/decaffeinated beverages or chocolate 12 hours prior to your test. Do not take any antihistamines 12 hours prior to your test. If the patient has contrast allergy: Patient will need a prescription for Prednisone  and very clear instructions (as follows): Prednisone  50 mg - take 13 hours prior to test Take another Prednisone  50 mg 7 hours prior to test Take another Prednisone  50 mg 1 hour prior to test Take Benadryl  50 mg 1 hour prior to test Patient must complete all four doses of above prophylactic medications. Patient will need a ride after test due to Benadryl .  On the Day of the Test: Drink plenty of water until 1 hour prior to the test. Do not eat any food 4 hours prior to the test. You may take your regular medications prior to the test.  Take metoprolol  (Lopressor ) two hours prior to test. HOLD Furosemide/Hydrochlorothiazide morning of the test. FEMALES- please wear underwire-free bra if available, avoid dresses & tight clothing   *For Clinical Staff only. Please instruct patient the following:* Heart Rate Medication Recommendations for Cardiac CT  Resting HR < 50 bpm  No medication  Resting HR 50-60 bpm and BP >110/50 mmHG   Consider Metoprolol  tartrate 25 mg PO 90-120 min prior to scan  Resting HR 60-65 bpm and BP >110/50 mmHG  Metoprolol  tartrate 50 mg PO 90-120 minutes prior to scan   Resting HR > 65 bpm and BP >110/50 mmHG  Metoprolol  tartrate 100 mg PO 90-120 minutes prior to scan  Consider Ivabradine 10-15 mg PO or a calcium  channel blocker for resting HR >60  bpm and contraindication to metoprolol  tartrate  Consider Ivabradine 10-15 mg PO in combination with metoprolol  tartrate for HR >80 bpm         After the Test: Drink plenty of water. After receiving IV contrast, you may experience a mild flushed feeling. This is normal. On occasion, you may experience a mild rash up to 24 hours after the test. This is not dangerous. If this occurs, you can take  Benadryl  25 mg and increase your fluid intake. If you experience trouble breathing, this can be serious. If it is severe call 911 IMMEDIATELY. If it is mild, please call our office. If you take any of these medications: Glipizide/Metformin , Avandament, Glucavance, please do not take 48 hours after completing test unless otherwise instructed.  We will call to schedule your test 2-4 weeks out understanding that some insurance companies will need an authorization prior to the service being performed.   For non-scheduling related questions, please contact the cardiac imaging nurse navigator should you have any questions/concerns: Camie Shutter, Cardiac Imaging Nurse Navigator Chantal Requena, Cardiac Imaging Nurse Navigator [facility] Direct Office Dial: [phone]   For scheduling needs, including cancellations and rescheduling, please call [place], [phone].    Follow-Up: At [facility], you and your health needs are our priority.  As part of our continuing mission to provide you with exceptional heart care, we have created designated Provider Care Teams.  These Care Teams include your primary Cardiologist (physician) and Advanced Practice Providers (APPs -  Physician Assistants and Nurse Practitioners) who all work together to provide you with the care you need, when you need it.  We recommend signing up for the patient portal called MyChart.  Sign up information is provided on this After Visit Summary.  MyChart is used to connect with patients for Virtual Visits (Telemedicine).  Patients are able to view lab/test results, encounter notes, upcoming appointments, etc.  Non-urgent messages can be sent to your provider as well.   To learn more about what you can do with MyChart, go to [email or web address].    Your next appointment:   2 month(s)  The format for your next appointment:   In Person  Provider:   Lamar Fitch, MD    Other  Instructions NA

## 2024-04-13 ENCOUNTER — Other Ambulatory Visit: Payer: Self-pay | Admitting: Sports Medicine

## 2024-04-13 ENCOUNTER — Other Ambulatory Visit: Payer: Self-pay | Admitting: Cardiology

## 2024-04-14 LAB — BASIC METABOLIC PANEL WITH GFR
BUN/Creatinine Ratio: 14 (ref 9–23)
BUN: 12 mg/dL (ref 6–24)
CO2: 25 mmol/L (ref 20–29)
Calcium: 9.9 mg/dL (ref 8.7–10.2)
Chloride: 101 mmol/L (ref 96–106)
Creatinine, Ser: 0.85 mg/dL (ref 0.57–1.00)
Glucose: 109 mg/dL — ABNORMAL HIGH (ref 70–99)
Potassium: 4.6 mmol/L (ref 3.5–5.2)
Sodium: 138 mmol/L (ref 134–144)
eGFR: 82 mL/min/1.73 (ref >59)

## 2024-04-14 LAB — LIPID PANEL
Chol/HDL Ratio: 5.9 ratio — ABNORMAL HIGH (ref 0.0–4.4)
Cholesterol, Total: 242 mg/dL — ABNORMAL HIGH (ref 100–199)
HDL: 41 mg/dL (ref >39)
LDL Chol Calc (NIH): 160 mg/dL — ABNORMAL HIGH (ref 0–99)
Triglycerides: 219 mg/dL — ABNORMAL HIGH (ref 0–149)
VLDL Cholesterol Cal: 41 mg/dL — ABNORMAL HIGH (ref 5–40)

## 2024-04-14 LAB — ALT: ALT: 37 IU/L — ABNORMAL HIGH (ref 0–32)

## 2024-04-14 LAB — AST: AST: 40 IU/L (ref 0–40)

## 2024-04-20 ENCOUNTER — Ambulatory Visit: Payer: Self-pay | Admitting: Cardiology

## 2024-04-22 ENCOUNTER — Telehealth (HOSPITAL_COMMUNITY): Payer: Self-pay | Admitting: Emergency Medicine

## 2024-04-22 NOTE — Telephone Encounter (Signed)
 Attempted to call patient regarding upcoming cardiac CT appointment. Left message on voicemail with name and callback number Rockwell Alexandria RN Navigator Cardiac Imaging [facility] [phone] Office [phone] Cell

## 2024-04-23 ENCOUNTER — Ambulatory Visit (HOSPITAL_COMMUNITY)
Admission: RE | Admit: 2024-04-23 | Discharge: 2024-04-23 | Disposition: A | Discharge status: Home / Self Care | Source: Ambulatory Visit | Attending: Cardiology | Admitting: Cardiology

## 2024-04-23 ENCOUNTER — Telehealth: Payer: Self-pay

## 2024-04-23 DIAGNOSIS — R072 Precordial pain: Secondary | ICD-10-CM | POA: Diagnosis not present

## 2024-04-23 DIAGNOSIS — E1169 Type 2 diabetes mellitus with other specified complication: Secondary | ICD-10-CM

## 2024-04-23 MED ORDER — NITROGLYCERIN 0.4 MG SL SUBL
0.8000 mg | SUBLINGUAL_TABLET | Freq: Once | SUBLINGUAL | Status: AC
  Administered 2024-04-23: 0.8 mg via SUBLINGUAL

## 2024-04-23 MED ORDER — IOHEXOL 350 MG/ML SOLN
100.0000 mL | Freq: Once | INTRAVENOUS | Status: AC | PRN
  Administered 2024-04-23: 100 mL via INTRAVENOUS

## 2024-04-23 NOTE — Addendum Note (Signed)
 Addended by: ARLOA PLANAS D on: 04/23/2024 08:19 AM   Modules accepted: Orders

## 2024-04-23 NOTE — Progress Notes (Signed)
 Pt has an appt on 07/07/24

## 2024-04-23 NOTE — Telephone Encounter (Signed)
 Lab Results reviewed with pt as per Dr. Karry note. Referral made to Pharm D.  Pt verbalized understanding and had no additional questions. Routed to PCP

## 2024-04-27 ENCOUNTER — Encounter: Payer: Self-pay | Admitting: Emergency Medicine

## 2024-04-27 ENCOUNTER — Ambulatory Visit
Admission: EM | Admit: 2024-04-27 | Discharge: 2024-04-27 | Disposition: A | Discharge status: Home / Self Care | Attending: Emergency Medicine | Admitting: Emergency Medicine

## 2024-04-27 DIAGNOSIS — R0789 Other chest pain: Secondary | ICD-10-CM | POA: Diagnosis not present

## 2024-04-27 DIAGNOSIS — K219 Gastro-esophageal reflux disease without esophagitis: Secondary | ICD-10-CM | POA: Diagnosis not present

## 2024-04-27 MED ORDER — PANTOPRAZOLE SODIUM 40 MG PO TBEC: 40.0000 mg | DELAYED_RELEASE_TABLET | Freq: Every day | ORAL | 1 refills | Status: AC

## 2024-04-27 MED ORDER — ALUM & MAG HYDROXIDE-SIMETH 200-200-20 MG/5ML PO SUSP
30.0000 mL | Freq: Once | ORAL | Status: AC
  Administered 2024-04-27: 30 mL via ORAL

## 2024-04-27 MED ORDER — ALUMINUM-MAGNESIUM-SIMETHICONE 200-200-20 MG/5ML PO SUSP: 30.0000 mL | Freq: Three times a day (TID) | ORAL | 0 refills | Status: AC

## 2024-04-27 MED ORDER — LIDOCAINE VISCOUS HCL 2 % MT SOLN: 15.0000 mL | OROMUCOSAL | 0 refills | Status: DC | PRN

## 2024-04-27 MED ORDER — PANTOPRAZOLE SODIUM 40 MG PO TBEC: 40.0000 mg | DELAYED_RELEASE_TABLET | Freq: Every day | ORAL | 1 refills | Status: DC

## 2024-04-27 MED ORDER — LIDOCAINE VISCOUS HCL 2 % MT SOLN
15.0000 mL | Freq: Once | OROMUCOSAL | Status: AC
  Administered 2024-04-27: 15 mL via OROMUCOSAL

## 2024-04-27 MED ORDER — LIDOCAINE VISCOUS HCL 2 % MT SOLN: 15.0000 mL | OROMUCOSAL | 0 refills | Status: AC | PRN

## 2024-04-27 MED ORDER — ALUMINUM-MAGNESIUM-SIMETHICONE 200-200-20 MG/5ML PO SUSP: 30.0000 mL | Freq: Three times a day (TID) | ORAL | 0 refills | Status: DC

## 2024-04-27 NOTE — Discharge Instructions (Addendum)
 You were evaluated for your increased chest pain  Blood pressure and heart rate are within normal ranges  EKG shows the heart is beating in a regular pace and rhythm  On exam I am able to push on the chest wall and reproduce some of your pain which is reassuring that this is most likely not your heart, this may be managed with Tylenol , warm compresses massage and stretching  You have been given a dose of Maalox with lidocaine  in attempt to help reduce indigestion and heartburn, pain medicine has been ordered for you at home that you may use every 6 hours as needed, daily take 30 minutes to an hour before meals  Stop use of Nexium  and (to Prose all daily to see if this provides more effective results in managing your heartburn and indigestion  Please schedule a follow-up appointment with your primary doctor and keep all upcoming appointments that you currently have made

## 2024-04-27 NOTE — ED Triage Notes (Signed)
 Patient reports CP x 1 day. Rates pain 5/10. Patient GERD medicine  and albuterol  inhaler.

## 2024-04-28 NOTE — ED Provider Notes (Signed)
 [name]    CSN: [id number] Arrival date & time: 04/27/24  1827      History   Chief Complaint Chief Complaint  Patient presents with   Chest Pain    HPI Karen Hicks is a 53 y.o. female.   Patient presents for evaluation of centralized chest pain beginning 2 to 3 weeks ago, worsening 1 day ago.  Symptoms began to worsen while walking up the stairs.  Has been constant since, feels as if there is a knot sitting in the center.  Initially thought to be acid reflux and attempted use of daily Nexium  which provided no relief.  Denies any dietary changes, last meal prior to symptoms was a salad with lemonade.  Denies abdominal bloating, increased gas production, nausea vomiting or diarrhea.  Denies presence of shortness of breath but did attempt use of inhaler which provided no relief.  Denies dizziness, lightheadedness, visual disturbance.  Denies fever or URI symptoms.  Currently being followed by cardiology for chest pain workup, cardiac CT 1 week ago negative, has upcoming echocardiogram on September 3.  History of hypertension, endorses metoprolol   started approximately 1 week ago.  Currently related to initiate statin.  History of costochondritis.  Past Medical History:  Diagnosis Date   Allergy    Anemia    when she was young   Arthritis    Complication of anesthesia    takes her alittle longer to wake up   GERD (gastroesophageal reflux disease)    Irritable bowel    Lactose intolerance    Migraine    PE (pulmonary embolism)    in 1994 meniscus repair.  Saw Dr. LaLonde   Pneumonia    PONV (postoperative nausea and vomiting)    Pre-diabetes    Restless legs syndrome    Sleep apnea    2007.  Uses the mask & machine when she needs it.  Dr. Joyce is aware    Patient Active Problem List   Diagnosis Date Noted   Precordial chest pain 04/09/2024   Family history of coronary arteriosclerosis 04/09/2024   Essential hypertension 04/09/2024   Metformin   adverse reaction 03/12/2023   Hot flashes 04/20/2021   Exercise-induced asthma 04/20/2021   Hyperlipidemia associated with type 2 diabetes mellitus (HCC) 10/06/2019   Type 2 diabetes mellitus without complications (HCC) 02/21/2018   Chest pain 10/25/2017   S/P TKR (total knee replacement), right 07/31/2016   Status post total left knee replacement 12/14/2014   Arthritis 07/21/2014   GERD (gastroesophageal reflux disease) 06/12/2011   Sleep apnea 06/12/2011   Obesity (BMI 30-39.9) 06/12/2011   Allergic rhinitis due to pollen 06/12/2011   Migraine headache 06/12/2011    Past Surgical History:  Procedure Laterality Date   CESAREAN SECTION     HARDWARE REMOVAL Left 08/03/2014   Procedure: HARDWARE REMOVAL;  Surgeon: Maude KANDICE Herald, MD;  Location: [facility] OR;  Service: Orthopedics;  Laterality: Left;   HARDWARE REMOVAL Right 07/31/2016   Procedure: HARDWARE REMOVAL;  Surgeon: Maude Herald, MD;  Location: 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  07/19/2014   right knee moderate DJD with multiple surgeries  07/19/2014   right wrist arthroscopy  2009   TOTAL ABDOMINAL HYSTERECTOMY  01/2006   FIBROIDS   TOTAL KNEE ARTHROPLASTY Left 08/03/2014   dr herald   TOTAL KNEE ARTHROPLASTY Left 08/03/2014   Procedure: LEFT TOTAL KNEE ARTHROPLASTY;  Surgeon: Maude KANDICE Herald, MD;  Location: [facility] OR;  Service: Orthopedics;  Laterality: Left;   TOTAL KNEE ARTHROPLASTY Right 07/31/2016   Procedure: TOTAL KNEE ARTHROPLASTY;  Surgeon: Maude Herald, MD;  Location: [facility] OR;  Service: Orthopedics;  Laterality: Right;   UPPER GI ENDOSCOPY  02/18/2020    OB History   No obstetric history on file.      Home Medications    Prior to Admission medications   Medication Sig Start Date End Date Taking? Authorizing Provider  albuterol  (VENTOLIN  HFA) 108 (90 Base) MCG/ACT inhaler Inhale 2 puffs into the lungs  every 6 (six) hours as needed for wheezing or shortness of breath. 03/10/24   Joyce Norleen BROCKS, MD  aluminum -magnesium  hydroxide-simethicone  (MAALOX) 200-200-20 MG/5ML SUSP Take 30 mLs by mouth 4 (four) times daily -  before meals and at bedtime. 04/27/24   Teresa Shelba SAUNDERS, NP  aspirin  EC 81 MG tablet Take 1 tablet (81 mg total) by mouth daily. Swallow whole. 04/09/24   Krasowski, Robert J, MD  cetirizine  (ZYRTEC  ALLERGY) 10 MG tablet Take 1 tablet (10 mg total) by mouth daily. 05/19/23   Christopher Savannah, PA-C  clobetasol (TEMOVATE) 0.05 % external solution Apply topically. 10/20/20   [provider]  Colloidal Oatmeal (AVEENO ECZEMA THERAPY EX) Apply 1 application topically 2 (two) times daily.    [provider]  dapagliflozin  propanediol (FARXIGA ) 10 MG TABS tablet Take 1 tablet (10 mg total) by mouth daily before breakfast. 03/12/23   Joyce Norleen BROCKS, MD  diphenhydrAMINE  (BENADRYL ) 25 MG tablet Take 25 mg by mouth daily as needed for allergies.    [provider]  esomeprazole  (NEXIUM ) 20 MG capsule Take 1 capsule (20 mg total) by mouth daily before breakfast. 06/10/13   Joyce Norleen BROCKS, MD  ezetimibe  (ZETIA ) 10 MG tablet Take 1 tablet (10 mg total) by mouth daily. 06/07/23   Joyce Norleen BROCKS, MD  glucose blood test strip Use as instructed 01/21/18   Joyce Norleen BROCKS, MD  lidocaine  (XYLOCAINE ) 2 % solution Use as directed 15 mLs in the mouth or throat as needed. 04/27/24   Teresa Shelba SAUNDERS, NP  lisinopril  (ZESTRIL ) 5 MG tablet Take 1 tablet (5 mg total) by mouth daily. 06/06/23   Joyce Norleen BROCKS, MD  loratadine  (CLARITIN ) 10 MG tablet Take 10 mg by mouth daily as needed for allergies.    [provider]  meloxicam  (MOBIC ) 15 MG tablet Take 1 tablet (15 mg total) by mouth daily. 03/17/24   Leonce Katz, DO  metoprolol  tartrate (LOPRESSOR ) 100 MG tablet Take one tablet 2 hours before cardiac CT for heart greater than 55 04/09/24   Krasowski, Robert J, MD  nitroGLYCERIN   (NITROSTAT ) 0.4 MG SL tablet Place 0.4 mg under the tongue as needed for chest pain.    [provider]  ondansetron  (ZOFRAN ) 4 MG tablet Take 1 tablet (4 mg total) by mouth every 8 (eight) hours as needed for nausea or vomiting. 11/23/21   Trudy Hendricks NOVAK, PA-C  pantoprazole  (PROTONIX ) 40 MG tablet Take 1 tablet (40 mg total) by mouth daily. 04/27/24 06/26/24  Teresa Shelba SAUNDERS, NP  promethazine  (PHENERGAN ) 12.5 MG tablet Take 1-2 tablets (12.5-25 mg total) by mouth every 6 (six) hours as needed for nausea or vomiting. 08/19/18   Lenis Barter, PA-C  pseudoephedrine  (SUDAFED) 30 MG tablet Take 1 tablet (30 mg total) by mouth every 8 (eight) hours as needed for congestion. 05/19/23   Christopher Savannah, PA-C  Rimegepant Sulfate (NURTEC)  75 MG TBDP TAKE 1 TABLET (75 MG TOTAL) BY MOUTH DAILY AS NEEDED 04/07/24   Joyce Norleen BROCKS, MD  rosuvastatin  (CRESTOR ) 40 MG tablet Take 1 tablet (40 mg total) by mouth daily. 06/06/23   Lalonde, John C, MD  selenium sulfide (SELSUN BLUE) 1 % LOTN  10/20/20   [provider]  tirzepatide  (MOUNJARO ) 12.5 MG/0.5ML Pen Inject 12.5 mg into the skin once a week. 03/10/24   Joyce Norleen BROCKS, MD  tiZANidine  (ZANAFLEX ) 4 MG tablet Take 1 tablet (4 mg total) by mouth at bedtime as needed. 03/17/24   Leonce Katz, DO  triamcinolone cream (KENALOG) 0.1 % SMARTSIG:1 Application Topical 2-3 Times Daily 05/12/21   [provider]  venlafaxine (EFFEXOR) 37.5 MG tablet Take 37.5 mg by mouth at bedtime.  12/16/19   [provider]    Family History Family History  Problem Relation Age of Onset   Arthritis Mother    Diabetes Mother    Heart disease Mother    Hypertension Mother    Kidney disease Mother    Stroke Mother    Gallbladder disease Mother    Glaucoma Mother    Seizures Mother    Migraines Mother    Arthritis Father    Diabetes Father    Hypertension Father    Kidney disease Father    Gallbladder disease Father    Migraines Sister     Migraines Brother    Healthy Son     Social History Social History   Tobacco Use   Smoking status: Never    Passive exposure: Never   Smokeless tobacco: Never  Vaping Use   Vaping status: Never Used  Substance Use Topics   Alcohol use: Not Currently   Drug use: No     Allergies   Mushroom extract complex (obsolete), Peach flavoring agent (non-screening), Adhesive [tape], Penicillins, and Latex   Review of Systems Review of Systems  Cardiovascular:  Positive for chest pain.     Physical Exam Triage Vital Signs ED Triage Vitals  Encounter Vitals Group     BP 04/27/24 1927 137/77     Girls Systolic BP Percentile --      Girls Diastolic BP Percentile --      Boys Systolic BP Percentile --      Boys Diastolic BP Percentile --      Pulse Rate 04/27/24 1927 87     Resp 04/27/24 1927 18     Temp 04/27/24 1927 98.5 F (36.9 C)     Temp Source 04/27/24 1927 Oral     SpO2 04/27/24 1925 97 %     Weight --      Height --      Head Circumference --      Peak Flow --      Pain Score 04/27/24 1925 5     Pain Loc --      Pain Education --      Exclude from Growth Chart --    No data found.  Updated Vital Signs BP 137/77 (BP Location: Right Arm)   Pulse 87   Temp 98.5 F (36.9 C) (Oral)   Resp 18   SpO2 97%   Visual Acuity Right Eye Distance:   Left Eye Distance:   Bilateral Distance:    Right Eye Near:   Left Eye Near:    Bilateral Near:     Physical Exam Constitutional:      Appearance: Normal appearance.  Eyes:  Extraocular Movements: Extraocular movements intact.  Cardiovascular:     Rate and Rhythm: Normal rate and regular rhythm.     Pulses: Normal pulses.     Heart sounds: Normal heart sounds.  Pulmonary:     Effort: Pulmonary effort is normal.     Breath sounds: Normal breath sounds.  Chest:       Comments: Tenderness present to the center of the chest wall over the sternum approximately at ribs 2-3, no ecchymosis, swelling or deformity,  chest wall is symmetrical Neurological:     Mental Status: She is alert and oriented to person, place, and time. Mental status is at baseline.      UC Treatments / Results  Labs (all labs ordered are listed, but only abnormal results are displayed) Labs Reviewed - No data to display  EKG   Radiology No results found.  Procedures Procedures (including critical care time)  Medications Ordered in UC Medications  alum & mag hydroxide-simeth (MAALOX/MYLANTA) 200-200-20 MG/5ML suspension 30 mL (30 mLs Oral Given 04/27/24 1940)  lidocaine  (XYLOCAINE ) 2 % viscous mouth solution 15 mL (15 mLs Mouth/Throat Given 04/27/24 1940)    Initial Impression / Assessment and Plan / UC Course  I have reviewed the triage vital signs and the nursing notes.  Pertinent labs & imaging results that were available during my care of the patient were reviewed by me and considered in my medical decision making (see chart for details).  Chest wall pain, GERD without esophagitis  Vital signs are stable and well patient visibly uncomfortable she is in no signs of distress nontoxic-appearing, able to sit within exam room without complication, EKG shows normal sinus rhythm, S1 and S2 heard to auscultation, discussed findings, able to produce chest discomfort with palpation therefore recommending supportive care through over-the-counter analgesics and RICE, given dose of Maalox with lidocaine  and on reevaluation after 5 to 10 minutes symptoms have begun to subside, change Nexium  to Protonix  as patient endorses she has been taking for many years and is unsure of the effectiveness at this time, additionally prescribed Maalox with lidocaine  and recommended PCP follow-up advised to keep all upcoming cardiology appointment Final Clinical Impressions(s) / UC Diagnoses   Final diagnoses:  Chest wall pain  Gastroesophageal reflux disease without esophagitis     Discharge Instructions      You were evaluated for your  increased chest pain  Blood pressure and heart rate are within normal ranges  EKG shows the heart is beating in a regular pace and rhythm  On exam I am able to push on the chest wall and reproduce some of your pain which is reassuring that this is most likely not your heart, this may be managed with Tylenol , warm compresses massage and stretching  You have been given a dose of Maalox with lidocaine  in attempt to help reduce indigestion and heartburn, pain medicine has been ordered for you at home that you may use every 6 hours as needed, daily take 30 minutes to an hour before meals  Stop use of Nexium  and (to Prose all daily to see if this provides more effective results in managing your heartburn and indigestion  Please schedule a follow-up appointment with your primary doctor and keep all upcoming appointments that you currently have made   ED Prescriptions     Medication Sig Dispense Auth. Provider   pantoprazole  (PROTONIX ) 40 MG tablet  (Status: Discontinued) Take 1 tablet (40 mg total) by mouth daily. 30 tablet Nohemi Nicklaus R, NP  aluminum -magnesium  hydroxide-simethicone  (MAALOX) 200-200-20 MG/5ML SUSP  (Status: Discontinued) Take 30 mLs by mouth 4 (four) times daily -  before meals and at bedtime. 1,680 mL Murdock Jellison R, NP   lidocaine  (XYLOCAINE ) 2 % solution  (Status: Discontinued) Use as directed 15 mLs in the mouth or throat as needed. 100 mL Viraaj Vorndran, Shelba R, NP   aluminum -magnesium  hydroxide-simethicone  (MAALOX) 200-200-20 MG/5ML SUSP Take 30 mLs by mouth 4 (four) times daily -  before meals and at bedtime. 1,680 mL Kelty Szafran R, NP   lidocaine  (XYLOCAINE ) 2 % solution Use as directed 15 mLs in the mouth or throat as needed. 100 mL Casilda Pickerill R, NP   pantoprazole  (PROTONIX ) 40 MG tablet Take 1 tablet (40 mg total) by mouth daily. 30 tablet Lupita Rosales R, NP      PDMP not reviewed this encounter.   Teresa Shelba SAUNDERS, [place] 04/28/24 [phone]

## 2024-04-29 ENCOUNTER — Ambulatory Visit: Payer: Self-pay | Admitting: Cardiology

## 2024-04-29 NOTE — Progress Notes (Unsigned)
 Ben Jackson D.[facility] [address] [place] [address] Phone: [phone]   Assessment and Plan:     1. Rib cage dysfunction (Primary) 2. Chronic bilateral low back pain with bilateral sciatica 3. Somatic dysfunction of thoracic region 4. Somatic dysfunction of lumbar region 5. Somatic dysfunction of pelvic region 6. Somatic dysfunction of rib region 7. Somatic dysfunction of upper extremities -Chronic with exacerbation, subsequent visit - Overall improvement in symptoms, however patient continues to experience discomfort through mid to lower back radiating into rib cage.  Still most consistent with musculoskeletal dysfunction - Use meloxicam  15 mg daily as needed for pain.  Recommend limiting chronic NSAIDs to 1-2 doses per week to prevent long-term side effects. - May continue tizanidine  4 mg daily as needed for muscle spasms - Start HEP for rib cage motion - Patient has received relief with OMT in the past.  Elects for repeat OMT today.  Tolerated well per note below. - Decision today to treat with OMT was based on Physical Exam -Urgent care visit on 04/2524 was related to chest discomfort and reflux symptoms.  It was not related to musculoskeletal pain that we have been treating - Reassuring that CT coronary on 04/23/2024 showed no chest wall normality and no acute osseous findings  After verbal consent patient was treated with HVLA (high velocity low amplitude), ME (muscle energy), FPR (flex positional release), ST (soft tissue), PC/PD (Pelvic Compression/ Pelvic Decompression) techniques in upper extremity, rib, thoracic, lumbar, and pelvic areas. Patient tolerated the procedure well with improvement in symptoms.  Patient educated on potential side effects of soreness and recommended to rest, hydrate, and use Tylenol  as needed for pain control.    15 additional minutes spent for educating Therapeutic Home Exercise Program.  This included  exercises focusing on stretching, strengthening, with focus on eccentric aspects.   Long term goals include an improvement in range of motion, strength, endurance as well as avoiding reinjury. Patient's frequency would include in 1-2 times a day, 3-5 times a week for a duration of 6-12 weeks. Proper technique shown and discussed handout in great detail with ATC.  All questions were discussed and answered.    Pertinent previous records reviewed include urgent care note 04/27/2024,   Follow Up: 4 weeks for reevaluation.  Could consider repeat OMT versus physical therapy   Subjective:   I, Demitria Hay, am serving as a Neurosurgeon for Doctor Morene Mace   Chief Complaint:rib  dysfunction    HPI:    03/09/2022 Patient is a 53 year old female complaining of rib dysfunction. Patient states went to ER 01/01/2022  Pain waxes and wanes with no particular pattern and does radiate to the right upper quadrant.   She denies any constipation or diarrhea.  She denies any urinary urgency, frequency, tenesmus, dysuria.  In the past, shortness of breath decreased ROM under breast to back been going on since may , states she has  fatty liver and no stones in her gallbladder ,was seen by dalldorf and he recommended OMT.  Receives temporary relief with NSAIDs, but tries not to use them frequently.   03/26/2022 Patient states that she not to bad the back is feeling better still has a little annoyance from the rib under the breast on right side, is able to walk and sleep through the night    12/03/2022 Patient states that she is in left sided rib pain flare    03/17/2024 Patient states Saturday morning she  was cleaning out a garage and lifted something heavy. Thinks she threw out her rib and low back again.    04/02/2024 Patient states she is a lot better. Still some soreness on the right side   04/30/2024 Patient states she is getting better. Some residual soreness.   Relevant Historical Information: Fatty liver,  DM type II, bilateral knee replacement  Additional pertinent review of systems negative.   Current Outpatient Medications:    albuterol  (VENTOLIN  HFA) 108 (90 Base) MCG/ACT inhaler, Inhale 2 puffs into the lungs every 6 (six) hours as needed for wheezing or shortness of breath., Disp: 8 g, Rfl: 0   aluminum -magnesium  hydroxide-simethicone  (MAALOX) 200-200-20 MG/5ML SUSP, Take 30 mLs by mouth 4 (four) times daily -  before meals and at bedtime., Disp: 1680 mL, Rfl: 0   aspirin  EC 81 MG tablet, Take 1 tablet (81 mg total) by mouth daily. Swallow whole., Disp: , Rfl:    cetirizine  (ZYRTEC  ALLERGY) 10 MG tablet, Take 1 tablet (10 mg total) by mouth daily., Disp: 30 tablet, Rfl: 0   clobetasol (TEMOVATE) 0.05 % external solution, Apply topically., Disp: , Rfl:    Colloidal Oatmeal (AVEENO ECZEMA THERAPY EX), Apply 1 application topically 2 (two) times daily., Disp: , Rfl:    dapagliflozin  propanediol (FARXIGA ) 10 MG TABS tablet, Take 1 tablet (10 mg total) by mouth daily before breakfast., Disp: 90 tablet, Rfl: 1   diphenhydrAMINE  (BENADRYL ) 25 MG tablet, Take 25 mg by mouth daily as needed for allergies., Disp: , Rfl:    esomeprazole  (NEXIUM ) 20 MG capsule, Take 1 capsule (20 mg total) by mouth daily before breakfast., Disp: 30 capsule, Rfl: 11   ezetimibe  (ZETIA ) 10 MG tablet, Take 1 tablet (10 mg total) by mouth daily., Disp: 90 tablet, Rfl: 3   glucose blood test strip, Use as instructed, Disp: 100 each, Rfl: 12   lidocaine  (XYLOCAINE ) 2 % solution, Use as directed 15 mLs in the mouth or throat as needed., Disp: 100 mL, Rfl: 0   lisinopril  (ZESTRIL ) 5 MG tablet, Take 1 tablet (5 mg total) by mouth daily., Disp: 90 tablet, Rfl: 3   loratadine  (CLARITIN ) 10 MG tablet, Take 10 mg by mouth daily as needed for allergies., Disp: , Rfl:    meloxicam  (MOBIC ) 15 MG tablet, Take 1 tablet (15 mg total) by mouth daily., Disp: 30 tablet, Rfl: 0   metoprolol  tartrate (LOPRESSOR ) 100 MG tablet, Take one tablet  2 hours before cardiac CT for heart greater than 55, Disp: 1 tablet, Rfl: 0   nitroGLYCERIN  (NITROSTAT ) 0.4 MG SL tablet, Place 0.4 mg under the tongue as needed for chest pain., Disp: , Rfl:    ondansetron  (ZOFRAN ) 4 MG tablet, Take 1 tablet (4 mg total) by mouth every 8 (eight) hours as needed for nausea or vomiting., Disp: 20 tablet, Rfl: 0   pantoprazole  (PROTONIX ) 40 MG tablet, Take 1 tablet (40 mg total) by mouth daily., Disp: 30 tablet, Rfl: 1   promethazine  (PHENERGAN ) 12.5 MG tablet, Take 1-2 tablets (12.5-25 mg total) by mouth every 6 (six) hours as needed for nausea or vomiting., Disp: 30 tablet, Rfl: 0   pseudoephedrine  (SUDAFED) 30 MG tablet, Take 1 tablet (30 mg total) by mouth every 8 (eight) hours as needed for congestion., Disp: 30 tablet, Rfl: 0   Rimegepant Sulfate (NURTEC) 75 MG TBDP, TAKE 1 TABLET (75 MG TOTAL) BY MOUTH DAILY AS NEEDED, Disp: 10 tablet, Rfl: 1   rosuvastatin  (CRESTOR ) 40 MG tablet, Take 1 tablet (  40 mg total) by mouth daily., Disp: 90 tablet, Rfl: 3   selenium sulfide (SELSUN BLUE) 1 % LOTN, , Disp: , Rfl:    tirzepatide  (MOUNJARO ) 12.5 MG/0.5ML Pen, Inject 12.5 mg into the skin once a week., Disp: 6 mL, Rfl: 1   tiZANidine  (ZANAFLEX ) 4 MG tablet, Take 1 tablet (4 mg total) by mouth at bedtime as needed., Disp: 30 tablet, Rfl: 0   triamcinolone cream (KENALOG) 0.1 %, SMARTSIG:1 Application Topical 2-3 Times Daily, Disp: , Rfl:    venlafaxine (EFFEXOR) 37.5 MG tablet, Take 37.5 mg by mouth at bedtime. , Disp: , Rfl:    Objective:     Vitals:   04/30/24 0747  Pulse: 94  SpO2: 99%  Weight: 205 lb (93 kg)  Height: 5' 6 (1.676 m)      Body mass index is 33.09 kg/m.    Physical Exam:    General: Well-appearing, cooperative, sitting comfortably in no acute distress.   OMT Physical Exam:  ASIS Compression Test: Positive Right Upper extremity: Upper extremities used to mobilize rib cage with muscle energy Rib: Ribs 4-6 stuck in relation on  right Thoracic: TTP paraspinal, T4-6 RRSL, T7-9 RLSR Lumbar: TTP paraspinal, L2 RLSL Pelvis: Right anterior innominate    Electronically signed by:  Odis Mace D.[facility] 8:20 AM 04/30/24

## 2024-04-30 ENCOUNTER — Ambulatory Visit: Admitting: Sports Medicine

## 2024-04-30 VITALS — HR 94 | Ht 66.0 in | Wt 205.0 lb

## 2024-04-30 DIAGNOSIS — M9902 Segmental and somatic dysfunction of thoracic region: Secondary | ICD-10-CM | POA: Diagnosis not present

## 2024-04-30 DIAGNOSIS — M5442 Lumbago with sciatica, left side: Secondary | ICD-10-CM | POA: Diagnosis not present

## 2024-04-30 DIAGNOSIS — M5441 Lumbago with sciatica, right side: Secondary | ICD-10-CM | POA: Diagnosis not present

## 2024-04-30 DIAGNOSIS — M9908 Segmental and somatic dysfunction of rib cage: Secondary | ICD-10-CM | POA: Diagnosis not present

## 2024-04-30 DIAGNOSIS — M9903 Segmental and somatic dysfunction of lumbar region: Secondary | ICD-10-CM

## 2024-04-30 DIAGNOSIS — M9905 Segmental and somatic dysfunction of pelvic region: Secondary | ICD-10-CM | POA: Diagnosis not present

## 2024-04-30 DIAGNOSIS — G8929 Other chronic pain: Secondary | ICD-10-CM | POA: Diagnosis not present

## 2024-04-30 DIAGNOSIS — M9907 Segmental and somatic dysfunction of upper extremity: Secondary | ICD-10-CM

## 2024-04-30 NOTE — Patient Instructions (Signed)
 Rib HEP   Voltaren gel over areas of pain   Recommend heating pad and foam rolling   4 week follow up

## 2024-05-05 ENCOUNTER — Telehealth: Payer: Self-pay

## 2024-05-05 NOTE — Telephone Encounter (Signed)
 Left message on My Chart with CT Angio results per Dr. Karry note. Routed to PCP.

## 2024-05-06 ENCOUNTER — Telehealth: Payer: Self-pay

## 2024-05-06 ENCOUNTER — Ambulatory Visit (HOSPITAL_BASED_OUTPATIENT_CLINIC_OR_DEPARTMENT_OTHER)
Admission: RE | Admit: 2024-05-06 | Discharge: 2024-05-06 | Disposition: A | Discharge status: Home / Self Care | Source: Ambulatory Visit | Attending: Cardiology | Admitting: Cardiology

## 2024-05-06 DIAGNOSIS — R0609 Other forms of dyspnea: Secondary | ICD-10-CM | POA: Diagnosis present

## 2024-05-06 LAB — ECHOCARDIOGRAM COMPLETE
AR max vel: 2.39 cm2
AV Area VTI: 2.36 cm2
AV Area mean vel: 2.31 cm2
AV Mean grad: 2 mmHg
AV Peak grad: 3.4 mmHg
Ao pk vel: 0.92 m/s
Area-P 1/2: 4.1 cm2
Calc EF: 56.6 %
S' Lateral: 3.9 cm
Single Plane A2C EF: 55.3 %
Single Plane A4C EF: 55.3 %

## 2024-05-06 NOTE — Telephone Encounter (Signed)
 LVM per DPR- per Dr. Karry note regarding normal CT Angio results. Encouraged to call with any questions. Routed to PCP.

## 2024-05-12 ENCOUNTER — Telehealth: Payer: Self-pay

## 2024-05-12 NOTE — Telephone Encounter (Signed)
 Left message on My Chart with Echo results per Dr. Karry note. Routed to PCP.

## 2024-05-27 NOTE — Progress Notes (Deleted)
 Karen Hicks D.[facility] [address] [place] [address] Phone: [phone]   Assessment and Plan:     *** - Patient has received relief with OMT in the past.  Elects for repeat OMT today.  Tolerated well per note below. - Decision today to treat with OMT was based on Physical Exam   After verbal consent patient was treated with HVLA (high velocity low amplitude), ME (muscle energy), FPR (flex positional release), ST (soft tissue), PC/PD (Pelvic Compression/ Pelvic Decompression) techniques in cervical, rib, thoracic, lumbar, and pelvic areas. Patient tolerated the procedure well with improvement in symptoms.  Patient educated on potential side effects of soreness and recommended to rest, hydrate, and use Tylenol  as needed for pain control.   Pertinent previous records reviewed include ***    Follow Up: ***     Subjective:   I, Rian Koon, am serving as a Neurosurgeon for Doctor Morene Mace   Chief Complaint:rib  dysfunction    HPI:    03/09/2022 Patient is a 53 year old female complaining of rib dysfunction. Patient states went to ER 01/01/2022  Pain waxes and wanes with no particular pattern and does radiate to the right upper quadrant.   She denies any constipation or diarrhea.  She denies any urinary urgency, frequency, tenesmus, dysuria.  In the past, shortness of breath decreased ROM under breast to back been going on since may , states she has  fatty liver and no stones in her gallbladder ,was seen by dalldorf and he recommended OMT.  Receives temporary relief with NSAIDs, but tries not to use them frequently.   03/26/2022 Patient states that she not to bad the back is feeling better still has a little annoyance from the rib under the breast on right side, is able to walk and sleep through the night    12/03/2022 Patient states that she is in left sided rib pain flare    03/17/2024 Patient states Saturday morning she was cleaning out a garage and  lifted something heavy. Thinks she threw out her rib and low back again.    04/02/2024 Patient states she is a lot better. Still some soreness on the right side    04/30/2024 Patient states she is getting better. Some residual soreness.  05/28/2024 Patient states   Relevant Historical Information: Fatty liver, DM type II, bilateral knee replacement  Additional pertinent review of systems negative.  Current Outpatient Medications  Medication Sig Dispense Refill   albuterol  (VENTOLIN  HFA) 108 (90 Base) MCG/ACT inhaler Inhale 2 puffs into the lungs every 6 (six) hours as needed for wheezing or shortness of breath. 8 g 0   aluminum -magnesium  hydroxide-simethicone  (MAALOX) 200-200-20 MG/5ML SUSP Take 30 mLs by mouth 4 (four) times daily -  before meals and at bedtime. 1680 mL 0   aspirin  EC 81 MG tablet Take 1 tablet (81 mg total) by mouth daily. Swallow whole.     cetirizine  (ZYRTEC  ALLERGY) 10 MG tablet Take 1 tablet (10 mg total) by mouth daily. 30 tablet 0   clobetasol (TEMOVATE) 0.05 % external solution Apply topically.     Colloidal Oatmeal (AVEENO ECZEMA THERAPY EX) Apply 1 application topically 2 (two) times daily.     dapagliflozin  propanediol (FARXIGA ) 10 MG TABS tablet Take 1 tablet (10 mg total) by mouth daily before breakfast. 90 tablet 1   diphenhydrAMINE  (BENADRYL ) 25 MG tablet Take 25 mg by mouth daily as needed for allergies.     esomeprazole  (NEXIUM )  20 MG capsule Take 1 capsule (20 mg total) by mouth daily before breakfast. 30 capsule 11   ezetimibe  (ZETIA ) 10 MG tablet Take 1 tablet (10 mg total) by mouth daily. 90 tablet 3   glucose blood test strip Use as instructed 100 each 12   lidocaine  (XYLOCAINE ) 2 % solution Use as directed 15 mLs in the mouth or throat as needed. 100 mL 0   lisinopril  (ZESTRIL ) 5 MG tablet Take 1 tablet (5 mg total) by mouth daily. 90 tablet 3   loratadine  (CLARITIN ) 10 MG tablet Take 10 mg by mouth daily as needed for allergies.     meloxicam   (MOBIC ) 15 MG tablet Take 1 tablet (15 mg total) by mouth daily. 30 tablet 0   metoprolol  tartrate (LOPRESSOR ) 100 MG tablet Take one tablet 2 hours before cardiac CT for heart greater than 55 1 tablet 0   nitroGLYCERIN  (NITROSTAT ) 0.4 MG SL tablet Place 0.4 mg under the tongue as needed for chest pain.     ondansetron  (ZOFRAN ) 4 MG tablet Take 1 tablet (4 mg total) by mouth every 8 (eight) hours as needed for nausea or vomiting. 20 tablet 0   pantoprazole  (PROTONIX ) 40 MG tablet Take 1 tablet (40 mg total) by mouth daily. 30 tablet 1   promethazine  (PHENERGAN ) 12.5 MG tablet Take 1-2 tablets (12.5-25 mg total) by mouth every 6 (six) hours as needed for nausea or vomiting. 30 tablet 0   pseudoephedrine  (SUDAFED) 30 MG tablet Take 1 tablet (30 mg total) by mouth every 8 (eight) hours as needed for congestion. 30 tablet 0   Rimegepant Sulfate (NURTEC) 75 MG TBDP TAKE 1 TABLET (75 MG TOTAL) BY MOUTH DAILY AS NEEDED 10 tablet 1   rosuvastatin  (CRESTOR ) 40 MG tablet Take 1 tablet (40 mg total) by mouth daily. 90 tablet 3   selenium sulfide (SELSUN BLUE) 1 % LOTN      tirzepatide  (MOUNJARO ) 12.5 MG/0.5ML Pen Inject 12.5 mg into the skin once a week. 6 mL 1   tiZANidine  (ZANAFLEX ) 4 MG tablet Take 1 tablet (4 mg total) by mouth at bedtime as needed. 30 tablet 0   triamcinolone cream (KENALOG) 0.1 % SMARTSIG:1 Application Topical 2-3 Times Daily     venlafaxine (EFFEXOR) 37.5 MG tablet Take 37.5 mg by mouth at bedtime.      No current facility-administered medications for this visit.      Objective:     There were no vitals filed for this visit.    There is no height or weight on file to calculate BMI.    Physical Exam:     General: Well-appearing, cooperative, sitting comfortably in no acute distress.   OMT Physical Exam:  ASIS Compression Test: Positive Right Cervical: TTP paraspinal, *** Rib: Bilateral elevated first rib with TTP Thoracic: TTP paraspinal,*** Lumbar: TTP  paraspinal,*** Pelvis: Right anterior innominate  Electronically signed by:  Odis Mace D.[facility] 7:51 AM 05/27/24

## 2024-05-28 ENCOUNTER — Ambulatory Visit: Admitting: Sports Medicine

## 2024-06-01 ENCOUNTER — Other Ambulatory Visit (HOSPITAL_COMMUNITY): Payer: Self-pay

## 2024-06-01 NOTE — Progress Notes (Unsigned)
 Ben Jackson D.CLEMENTEEN [facility] [address] [place] [address] Phone: [phone]   Assessment and Plan:     1. Rib cage dysfunction (Primary) 2. Chronic bilateral low back pain with bilateral sciatica - Chronic with exacerbation, subsequent visit - Overall significant improvement in multiple areas of musculoskeletal pain with HEP, OMT, as needed meloxicam  use - Continue HEP - Use meloxicam  15 mg daily as needed for breakthrough pain.  Recommend limiting chronic NSAIDs to 1-2 doses per week to prevent long-term side effects. Use Tylenol  500 to 1000 mg tablets 2-3 times a day as needed for day-to-day pain relief.     - With significant overall improvement in symptoms, we elected to not proceed with additional OMT at today's visit   Pertinent previous records reviewed include none  Follow Up: As needed   Subjective:   I, Chestine Reeves, am serving as a Neurosurgeon for Doctor Morene Mace   Chief Complaint:rib  dysfunction    HPI:    03/09/2022 Patient is a 53 year old female complaining of rib dysfunction. Patient states went to ER 01/01/2022  Pain waxes and wanes with no particular pattern and does radiate to the right upper quadrant.   She denies any constipation or diarrhea.  She denies any urinary urgency, frequency, tenesmus, dysuria.  In the past, shortness of breath decreased ROM under breast to back been going on since may , states she has  fatty liver and no stones in her gallbladder ,was seen by dalldorf and he recommended OMT.  Receives temporary relief with NSAIDs, but tries not to use them frequently.   03/26/2022 Patient states that she not to bad the back is feeling better still has a little annoyance from the rib under the breast on right side, is able to walk and sleep through the night    12/03/2022 Patient states that she is in left sided rib pain flare    03/17/2024 Patient states Saturday morning she was cleaning out a garage and lifted  something heavy. Thinks she threw out her rib and low back again.    04/02/2024 Patient states she is a lot better. Still some soreness on the right side    04/30/2024 Patient states she is getting better. Some residual soreness.  06/02/2024 Patient states she is a lot better    Relevant Historical Information: Fatty liver, DM type II, bilateral knee replacement  Additional pertinent review of systems negative.  Current Outpatient Medications  Medication Sig Dispense Refill   albuterol  (VENTOLIN  HFA) 108 (90 Base) MCG/ACT inhaler Inhale 2 puffs into the lungs every 6 (six) hours as needed for wheezing or shortness of breath. 8 g 0   aluminum -magnesium  hydroxide-simethicone  (MAALOX) 200-200-20 MG/5ML SUSP Take 30 mLs by mouth 4 (four) times daily -  before meals and at bedtime. 1680 mL 0   aspirin  EC 81 MG tablet Take 1 tablet (81 mg total) by mouth daily. Swallow whole.     cetirizine  (ZYRTEC  ALLERGY) 10 MG tablet Take 1 tablet (10 mg total) by mouth daily. 30 tablet 0   clobetasol (TEMOVATE) 0.05 % external solution Apply topically.     Colloidal Oatmeal (AVEENO ECZEMA THERAPY EX) Apply 1 application topically 2 (two) times daily.     dapagliflozin  propanediol (FARXIGA ) 10 MG TABS tablet Take 1 tablet (10 mg total) by mouth daily before breakfast. 90 tablet 1   diphenhydrAMINE  (BENADRYL ) 25 MG tablet Take 25 mg by mouth daily as needed for allergies.  esomeprazole  (NEXIUM ) 20 MG capsule Take 1 capsule (20 mg total) by mouth daily before breakfast. 30 capsule 11   ezetimibe  (ZETIA ) 10 MG tablet Take 1 tablet (10 mg total) by mouth daily. 90 tablet 3   glucose blood test strip Use as instructed 100 each 12   lidocaine  (XYLOCAINE ) 2 % solution Use as directed 15 mLs in the mouth or throat as needed. 100 mL 0   lisinopril  (ZESTRIL ) 5 MG tablet Take 1 tablet (5 mg total) by mouth daily. 90 tablet 3   loratadine  (CLARITIN ) 10 MG tablet Take 10 mg by mouth daily as needed for allergies.      meloxicam  (MOBIC ) 15 MG tablet Take 1 tablet (15 mg total) by mouth daily. 30 tablet 0   metoprolol  tartrate (LOPRESSOR ) 100 MG tablet Take one tablet 2 hours before cardiac CT for heart greater than 55 1 tablet 0   nitroGLYCERIN  (NITROSTAT ) 0.4 MG SL tablet Place 0.4 mg under the tongue as needed for chest pain.     ondansetron  (ZOFRAN ) 4 MG tablet Take 1 tablet (4 mg total) by mouth every 8 (eight) hours as needed for nausea or vomiting. 20 tablet 0   pantoprazole  (PROTONIX ) 40 MG tablet Take 1 tablet (40 mg total) by mouth daily. 30 tablet 1   promethazine  (PHENERGAN ) 12.5 MG tablet Take 1-2 tablets (12.5-25 mg total) by mouth every 6 (six) hours as needed for nausea or vomiting. 30 tablet 0   pseudoephedrine  (SUDAFED) 30 MG tablet Take 1 tablet (30 mg total) by mouth every 8 (eight) hours as needed for congestion. 30 tablet 0   Rimegepant Sulfate (NURTEC) 75 MG TBDP TAKE 1 TABLET (75 MG TOTAL) BY MOUTH DAILY AS NEEDED 10 tablet 1   rosuvastatin  (CRESTOR ) 40 MG tablet Take 1 tablet (40 mg total) by mouth daily. 90 tablet 3   selenium sulfide (SELSUN BLUE) 1 % LOTN      tirzepatide  (MOUNJARO ) 12.5 MG/0.5ML Pen Inject 12.5 mg into the skin once a week. 6 mL 1   tiZANidine  (ZANAFLEX ) 4 MG tablet Take 1 tablet (4 mg total) by mouth at bedtime as needed. 30 tablet 0   triamcinolone cream (KENALOG) 0.1 % SMARTSIG:1 Application Topical 2-3 Times Daily     venlafaxine (EFFEXOR) 37.5 MG tablet Take 37.5 mg by mouth at bedtime.      No current facility-administered medications for this visit.      Objective:     Vitals:   06/02/24 0805  BP: 130/78  Pulse: 71  SpO2: 100%  Weight: 202 lb (91.6 kg)  Height: 5' 6 (1.676 m)      Body mass index is 32.6 kg/m.    Physical Exam:     General: Well-appearing, cooperative, sitting comfortably in no acute distress.  HEENT: Normocephalic, atraumatic.   Neck: No gross abnormality.  Cardiovascular: No pallor or cyanosis. Resp: Comfortable WOB.    Abdomen: Non distended.   Skin: Warm and dry; no focal rashes identified on limited exam. Extremities: No cyanosis or edema.  Neuro: Gross motor and sensory intact. Gait normal. Psychiatric: Mood and affect are appropriate.   Electronically signed by:  Odis Mace D[facility] 8:18 AM 06/02/24

## 2024-06-02 ENCOUNTER — Ambulatory Visit: Admitting: Sports Medicine

## 2024-06-02 VITALS — BP 130/78 | HR 71 | Ht 66.0 in | Wt 202.0 lb

## 2024-06-02 DIAGNOSIS — M5441 Lumbago with sciatica, right side: Secondary | ICD-10-CM

## 2024-06-02 DIAGNOSIS — G8929 Other chronic pain: Secondary | ICD-10-CM | POA: Diagnosis not present

## 2024-06-02 DIAGNOSIS — M9908 Segmental and somatic dysfunction of rib cage: Secondary | ICD-10-CM | POA: Diagnosis not present

## 2024-06-02 DIAGNOSIS — M5442 Lumbago with sciatica, left side: Secondary | ICD-10-CM

## 2024-06-04 ENCOUNTER — Ambulatory Visit: Admitting: Pharmacist

## 2024-06-15 ENCOUNTER — Telehealth: Payer: Self-pay

## 2024-06-15 NOTE — Telephone Encounter (Signed)
 Copied from CRM [id number]. Topic: Clinical - Order For Equipment >> Jun 15, 2024 12:25 PM Antwanette L wrote: Reason for CRM: The patient called to request a CPAP machine.

## 2024-06-16 ENCOUNTER — Other Ambulatory Visit: Payer: Self-pay

## 2024-06-16 DIAGNOSIS — G4730 Sleep apnea, unspecified: Secondary | ICD-10-CM

## 2024-06-16 NOTE — Telephone Encounter (Signed)
 Called Patient and she said after her sleep study that you told her she was okay for now, and since then her sleep patterns have gotten worse such as snoring, feeling tired and thirsty when she wakes up, she doesn't feel like she has rested when she wakes up.

## 2024-06-24 ENCOUNTER — Ambulatory Visit: Attending: Cardiology | Admitting: Cardiology

## 2024-06-24 ENCOUNTER — Encounter: Payer: Self-pay | Admitting: Cardiology

## 2024-06-24 VITALS — BP 130/86 | HR 87 | Ht 66.0 in | Wt 198.0 lb

## 2024-06-24 DIAGNOSIS — R072 Precordial pain: Secondary | ICD-10-CM | POA: Diagnosis not present

## 2024-06-24 DIAGNOSIS — E119 Type 2 diabetes mellitus without complications: Secondary | ICD-10-CM | POA: Diagnosis not present

## 2024-06-24 DIAGNOSIS — I10 Essential (primary) hypertension: Secondary | ICD-10-CM

## 2024-06-24 DIAGNOSIS — E1169 Type 2 diabetes mellitus with other specified complication: Secondary | ICD-10-CM | POA: Diagnosis not present

## 2024-06-24 DIAGNOSIS — E785 Hyperlipidemia, unspecified: Secondary | ICD-10-CM

## 2024-06-24 NOTE — Patient Instructions (Signed)

## 2024-06-24 NOTE — Progress Notes (Signed)
 Cardiology Office Note:    Date:  06/24/2024   ID:  Karen Hicks, DOB Mar 21, 1971, MRN [id number]  PCP:  Karen Norleen BROCKS, MD  Cardiologist:  Karen Fitch, MD    Referring MD: Karen Norleen BROCKS, MD   Chief Complaint  Patient presents with   Follow-up    History of Present Illness:    Karen Hicks is a 53 y.o. female with multiple risk factors for coronary artery disease including family history dyslipidemia.  She had batteries of test done that include coronary CT angio pleasantly surprising is the fact that she got calcium  score 0 and she had no coronary artery disease.  She also had echocardiogram done which was normal.  She is very pleased with the results of this test and she comes here to talk about this.  Denies have any chest pain tightness squeezing pressure burning chest doing well  Past Medical History:  Diagnosis Date   Allergy    Anemia    when she was young   Arthritis    Complication of anesthesia    takes her alittle longer to wake up   GERD (gastroesophageal reflux disease)    Irritable bowel    Lactose intolerance    Migraine    PE (pulmonary embolism)    in 1994 meniscus repair.  Saw Dr. LaLonde   Pneumonia    PONV (postoperative nausea and vomiting)    Pre-diabetes    Restless legs syndrome    Sleep apnea    2007.  Uses the mask & machine when she needs it.  Dr. 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  07/19/2014   right knee moderate DJD with multiple surgeries  07/19/2014    right wrist arthroscopy  2009   TOTAL ABDOMINAL HYSTERECTOMY  01/2006   FIBROIDS   TOTAL KNEE ARTHROPLASTY Left 08/03/2014   dr Hicks   TOTAL KNEE ARTHROPLASTY Left 08/03/2014   Procedure: LEFT TOTAL KNEE ARTHROPLASTY;  Surgeon: Karen KANDICE Herald, MD;  Location: [facility] OR;  Service: Orthopedics;  Laterality: Left;   TOTAL KNEE ARTHROPLASTY Right 07/31/2016   Procedure: TOTAL KNEE ARTHROPLASTY;  Surgeon: Karen Herald, MD;  Location: [facility] OR;  Service: Orthopedics;  Laterality: Right;   UPPER GI ENDOSCOPY  02/18/2020    Current Medications: Current Meds  Medication Sig   albuterol  (VENTOLIN  HFA) 108 (90 Base) MCG/ACT inhaler Inhale 2 puffs into the lungs every 6 (six) hours as needed for wheezing or shortness of breath.   aluminum -magnesium  hydroxide-simethicone  (MAALOX) 200-200-20 MG/5ML SUSP Take 30 mLs by mouth 4 (four) times daily -  before meals and at bedtime.   aspirin  EC 81 MG tablet Take 1 tablet (81 mg total) by mouth daily. Swallow whole.   cetirizine  (ZYRTEC  ALLERGY) 10 MG tablet Take 1 tablet (10 mg total) by mouth daily.   clobetasol (TEMOVATE) 0.05 % external solution Apply topically.   Colloidal Oatmeal (AVEENO ECZEMA THERAPY EX) Apply 1 application topically 2 (two) times daily.   dapagliflozin  propanediol (FARXIGA ) 10 MG TABS tablet  Take 1 tablet (10 mg total) by mouth daily before breakfast.   diphenhydrAMINE  (BENADRYL ) 25 MG tablet Take 25 mg by mouth daily as needed for allergies.   esomeprazole  (NEXIUM ) 20 MG capsule Take 1 capsule (20 mg total) by mouth daily before breakfast.   ezetimibe  (ZETIA ) 10 MG tablet Take 1 tablet (10 mg total) by mouth daily.   folic acid (FOLVITE) 1 MG tablet Take 1 mg by mouth daily.   glucose blood test strip Use as instructed   Hyoscyamine Sulfate SL 0.125 MG SUBL Place 125 mcg under the tongue every 4 (four) hours as needed (bladder spasms).   lidocaine  (XYLOCAINE ) 2 % solution Use as directed 15 mLs in the mouth or throat as needed.    lisinopril  (ZESTRIL ) 5 MG tablet Take 1 tablet (5 mg total) by mouth daily.   loratadine  (CLARITIN ) 10 MG tablet Take 10 mg by mouth daily as needed for allergies.   meloxicam  (MOBIC ) 15 MG tablet Take 1 tablet (15 mg total) by mouth daily.   metoprolol  tartrate (LOPRESSOR ) 100 MG tablet Take one tablet 2 hours before cardiac CT for heart greater than 55   nitroGLYCERIN  (NITROSTAT ) 0.4 MG SL tablet Place 0.4 mg under the tongue as needed for chest pain.   ondansetron  (ZOFRAN ) 4 MG tablet Take 1 tablet (4 mg total) by mouth every 8 (eight) hours as needed for nausea or vomiting.   pantoprazole  (PROTONIX ) 40 MG tablet Take 1 tablet (40 mg total) by mouth daily.   promethazine  (PHENERGAN ) 12.5 MG tablet Take 1-2 tablets (12.5-25 mg total) by mouth every 6 (six) hours as needed for nausea or vomiting.   pseudoephedrine  (SUDAFED) 30 MG tablet Take 1 tablet (30 mg total) by mouth every 8 (eight) hours as needed for congestion.   Rimegepant Sulfate (NURTEC) 75 MG TBDP TAKE 1 TABLET (75 MG TOTAL) BY MOUTH DAILY AS NEEDED   rosuvastatin  (CRESTOR ) 40 MG tablet Take 1 tablet (40 mg total) by mouth daily.   selenium sulfide (SELSUN BLUE) 1 % LOTN    tirzepatide  (MOUNJARO ) 12.5 MG/0.5ML Pen Inject 12.5 mg into the skin once a week.   tiZANidine  (ZANAFLEX ) 4 MG tablet Take 1 tablet (4 mg total) by mouth at bedtime as needed.   triamcinolone cream (KENALOG) 0.1 % SMARTSIG:1 Application Topical 2-3 Times Daily   venlafaxine (EFFEXOR) 37.5 MG tablet Take 37.5 mg by mouth at bedtime.      Allergies:   Mushroom extract complex (obsolete), Peach flavoring agent (non-screening), Adhesive [tape], Penicillins, and Latex   Social History   Socioeconomic History   Marital status: Single    Spouse name: Not on file   Number of children: Not on file   Years of education: Not on file   Highest education level: Not on file  Occupational History   Not on file  Tobacco Use   Smoking status: Never    Passive  exposure: Never   Smokeless tobacco: Never  Vaping Use   Vaping status: Never Used  Substance and Sexual Activity   Alcohol use: Not Currently   Drug use: No   Sexual activity: Not on file  Other Topics Concern   Not on file  Social History Narrative   Lives with son in a one story home.  Works as a Loss adjuster, chartered at [employer].  Education: Masters   Caffeine rarely   Right handed   Social Drivers of Health   Financial Resource Strain: Low Risk  (06/06/2023)   Overall Financial  Resource Strain (CARDIA)    Difficulty of Paying Living Expenses: Not very hard  Food Insecurity: No Food Insecurity (06/06/2023)   Hunger Vital Sign    Worried About Running Out of Food in the Last Year: Never true    Ran Out of Food in the Last Year: Never true  Transportation Needs: No Transportation Needs (06/06/2023)   PRAPARE - Administrator, Civil Service (Medical): No    Lack of Transportation (Non-Medical): No  Physical Activity: Insufficiently Active (06/06/2023)   Exercise Vital Sign    Days of Exercise per Week: 4 days    Minutes of Exercise per Session: 30 min  Stress: No Stress Concern Present (06/06/2023)   [employer] of Occupational Health - Occupational Stress Questionnaire    Feeling of Stress : Only a little  Social Connections: Moderately Integrated (06/06/2023)   Social Connection and Isolation Panel    Frequency of Communication with Friends and Family: More than three times a week    Frequency of Social Gatherings with Friends and Family: Twice a week    Attends Religious Services: More than 4 times per year    Active Member of [employer] or Organizations: Yes    Attends Engineer, structural: More than 4 times per year    Marital Status: Divorced     Family History: The patient's family history includes Arthritis in her father and mother; Diabetes in her father and mother; Gallbladder disease in her father and mother; Glaucoma in her mother; Healthy  in her son; Heart disease in her mother; Hypertension in her father and mother; Kidney disease in her father and mother; Migraines in her brother, mother, and sister; Seizures in her mother; Stroke in her mother. ROS:   Please see the history of present illness.    All 14 point review of systems negative except as described per history of present illness  EKGs/Labs/Other Studies Reviewed:         Recent Labs: 04/13/2024: ALT 37; BUN 12; Creatinine, Ser 0.85; Potassium 4.6; Sodium 138  Recent Lipid Panel    Component Value Date/Time   CHOL 242 (H) 04/13/2024 0841   TRIG 219 (H) 04/13/2024 0841   HDL 41 04/13/2024 0841   CHOLHDL 5.9 (H) 04/13/2024 0841   CHOLHDL 6.9 06/10/2013 1413   VLDL 47 (H) 06/10/2013 1413   LDLCALC 160 (H) 04/13/2024 0841    Physical Exam:    VS:  BP 130/86   Pulse 87   Ht 5' 6 (1.676 m)   Wt 198 lb (89.8 kg)   SpO2 97%   BMI 31.96 kg/m     Wt Readings from Last 3 Encounters:  06/24/24 198 lb (89.8 kg)  06/02/24 202 lb (91.6 kg)  04/30/24 205 lb (93 kg)     GEN:  Well nourished, well developed in no acute distress HEENT: Normal NECK: No JVD; No carotid bruits LYMPHATICS: No lymphadenopathy CARDIAC: RRR, no murmurs, no rubs, no gallops RESPIRATORY:  Clear to auscultation without rales, wheezing or rhonchi  ABDOMEN: Soft, non-tender, non-distended MUSCULOSKELETAL:  No edema; No deformity  SKIN: Warm and dry LOWER EXTREMITIES: no swelling NEUROLOGIC:  Alert and oriented x 3 PSYCHIATRIC:  Normal affect   ASSESSMENT:    1. Essential hypertension   2. Hyperlipidemia associated with type 2 diabetes mellitus (HCC)   3. Type 2 diabetes mellitus without complication, without long-term current use of insulin (HCC)   4. Precordial chest pain    PLAN:    In  order of problems listed above:  Essential hypertension blood pressure well-controlled continue present management. Dyslipidemia she is scheduled to talk to pharmacist I anticipate need to  start PCSK9 agent. Type 2 diabetes followed by primary care physician stable. Precordial chest pain denies having any   Medication Adjustments/Labs and Tests Ordered: Current medicines are reviewed at length with the patient today.  Concerns regarding medicines are outlined above.  No orders of the defined types were placed in this encounter.  Medication changes: No orders of the defined types were placed in this encounter.   Signed, Karen DOROTHA Fitch, MD, [facility] 06/24/2024 9:12 AM    [facility]

## 2024-07-06 ENCOUNTER — Other Ambulatory Visit: Payer: Self-pay

## 2024-07-06 DIAGNOSIS — E119 Type 2 diabetes mellitus without complications: Secondary | ICD-10-CM

## 2024-07-06 DIAGNOSIS — E1169 Type 2 diabetes mellitus with other specified complication: Secondary | ICD-10-CM

## 2024-07-06 DIAGNOSIS — Z23 Encounter for immunization: Secondary | ICD-10-CM

## 2024-07-06 DIAGNOSIS — Z0000 Encounter for general adult medical examination without abnormal findings: Secondary | ICD-10-CM

## 2024-07-07 ENCOUNTER — Ambulatory Visit (INDEPENDENT_AMBULATORY_CARE_PROVIDER_SITE_OTHER): Payer: Self-pay | Admitting: Family Medicine

## 2024-07-07 ENCOUNTER — Encounter: Payer: Self-pay | Admitting: Family Medicine

## 2024-07-07 VITALS — BP 126/78 | HR 72 | Ht 66.0 in | Wt 199.2 lb

## 2024-07-07 DIAGNOSIS — T383X5S Adverse effect of insulin and oral hypoglycemic [antidiabetic] drugs, sequela: Secondary | ICD-10-CM

## 2024-07-07 DIAGNOSIS — E119 Type 2 diabetes mellitus without complications: Secondary | ICD-10-CM

## 2024-07-07 DIAGNOSIS — Z96651 Presence of right artificial knee joint: Secondary | ICD-10-CM

## 2024-07-07 DIAGNOSIS — E1169 Type 2 diabetes mellitus with other specified complication: Secondary | ICD-10-CM

## 2024-07-07 DIAGNOSIS — Z23 Encounter for immunization: Secondary | ICD-10-CM

## 2024-07-07 DIAGNOSIS — K219 Gastro-esophageal reflux disease without esophagitis: Secondary | ICD-10-CM

## 2024-07-07 DIAGNOSIS — E785 Hyperlipidemia, unspecified: Secondary | ICD-10-CM

## 2024-07-07 DIAGNOSIS — E669 Obesity, unspecified: Secondary | ICD-10-CM

## 2024-07-07 DIAGNOSIS — Z8249 Family history of ischemic heart disease and other diseases of the circulatory system: Secondary | ICD-10-CM

## 2024-07-07 DIAGNOSIS — G43009 Migraine without aura, not intractable, without status migrainosus: Secondary | ICD-10-CM

## 2024-07-07 DIAGNOSIS — Z96652 Presence of left artificial knee joint: Secondary | ICD-10-CM

## 2024-07-07 DIAGNOSIS — M1990 Unspecified osteoarthritis, unspecified site: Secondary | ICD-10-CM | POA: Diagnosis not present

## 2024-07-07 DIAGNOSIS — J301 Allergic rhinitis due to pollen: Secondary | ICD-10-CM | POA: Diagnosis not present

## 2024-07-07 DIAGNOSIS — Z0000 Encounter for general adult medical examination without abnormal findings: Secondary | ICD-10-CM

## 2024-07-07 DIAGNOSIS — R232 Flushing: Secondary | ICD-10-CM

## 2024-07-07 LAB — LIPID PANEL
Chol/HDL Ratio: 7.5 ratio — ABNORMAL HIGH (ref 0.0–4.4)
Cholesterol, Total: 276 mg/dL — ABNORMAL HIGH (ref 100–199)
HDL: 37 mg/dL — ABNORMAL LOW (ref >39)
LDL Chol Calc (NIH): 190 mg/dL — ABNORMAL HIGH (ref 0–99)
Triglycerides: 253 mg/dL — ABNORMAL HIGH (ref 0–149)
VLDL Cholesterol Cal: 49 mg/dL — ABNORMAL HIGH (ref 5–40)

## 2024-07-07 LAB — POCT GLYCOSYLATED HEMOGLOBIN (HGB A1C): Hemoglobin A1C: 5.8 % — AB (ref 4.0–5.6)

## 2024-07-07 MED ORDER — LISINOPRIL 5 MG PO TABS: 5.0000 mg | ORAL_TABLET | Freq: Every day | ORAL | 3 refills | Status: AC

## 2024-07-07 MED ORDER — TIRZEPATIDE 12.5 MG/0.5ML ~~LOC~~ SOAJ: 12.5000 mg | SUBCUTANEOUS | 1 refills | Status: AC

## 2024-07-07 MED ORDER — DAPAGLIFLOZIN PROPANEDIOL 10 MG PO TABS: 10.0000 mg | ORAL_TABLET | Freq: Every day | ORAL | 1 refills | Status: AC

## 2024-07-07 MED ORDER — ROSUVASTATIN CALCIUM 40 MG PO TABS: 40.0000 mg | ORAL_TABLET | Freq: Every day | ORAL | 3 refills | Status: AC

## 2024-07-07 MED ORDER — EZETIMIBE 10 MG PO TABS: 10.0000 mg | ORAL_TABLET | Freq: Every day | ORAL | 3 refills | Status: AC

## 2024-07-07 MED ORDER — NURTEC 75 MG PO TBDP: 1.0000 | ORAL_TABLET | Freq: Every day | ORAL | 1 refills | Status: AC | PRN

## 2024-07-07 NOTE — Progress Notes (Signed)
 Complete physical exam  Patient: Karen Hicks   DOB: [birth year]   53 y.o. Female  MRN: [id number]  Subjective:    Chief Complaint  Patient presents with   Annual Exam    Fasting-She's already had flu shot-  Discussed the use of AI scribe software for clinical note transcription with the patient, who gave verbal consent to proceed.  Karen Hicks is a 53 y.o. female who presents today for a complete physical exam.  She reports consuming a low fat diet. Gym/ health club routine includes walking on track . She generally feels well. She reports sleeping well.   She manages her diabetes with Mounjaro  and Farxiga , achieving improved blood sugar levels with fasting glucose readings between 105 and 115 mg/dL. She does not check her blood sugar at night due to her schedule. She previously did not tolerate metformin . Regular exercise and adherence to a diet plan have contributed to her weight loss of four dress sizes.  She recently started using a CPAP machine for sleep apnea, which has improved her fatigue and tiredness. She began using it five days ago and is waking up more refreshed.  Her hypertension is managed with lisinopril , with no reported issues. Hyperlipidemia is managed with rosuvastatin  and Zetia , and she has an upcoming appointment to discuss starting Repatha. She has received flu, COVID, shingles, and pneumonia vaccinations.  Her allergies are well-controlled, and she experiences headaches only once every three months, using Nurtec as needed for migraines. She has a history of bilateral knee replacements with no swelling or issues, although she can feel changes in barometric pressure in her wrists and hands. She is not having any reflux symptoms at the present time. She engages in 30 minutes of exercise daily and stretches in the morning and afternoon. She does not smoke or drink alcohol.             Most recent fall risk assessment:    07/07/2024    8:27  AM  Fall Risk   Falls in the past year? 0  Number falls in past yr: 0  Injury with Fall? 0  Risk for fall due to : No Fall Risks  Follow up Falls evaluation completed     Most recent depression screenings:    07/07/2024    8:27 AM 06/06/2023    3:16 PM  PHQ 2/9 Scores  PHQ - 2 Score 0 0    Vision: [facility], last exam November 24' Dentist: [facility] in [place] 24' Cardio: Dr. Gregg Neuro: Dr. Tobie ENT: [facility] Orth[facility]: Dr. Aldona Ortho: Dr. Leonce    Immunization History  Administered Date(s) Administered   Influenza,inj,Quad PF,6+ Mos 06/12/2018, 07/06/2019, 05/30/2020, 04/30/2022   Influenza-Unspecified 09/22/2021   PFIZER(Purple Top)SARS-COV-2 Vaccination 09/29/2019, 10/10/2019, 07/05/2020, 12/15/2020   PNEUMOCOCCAL CONJUGATE-20 04/20/2021   Pfizer(Comirnaty)Fall Seasonal Vaccine 12 years and older 11/09/2022, 07/07/2024   Tdap 05/30/2020   Zoster Recombinant(Shingrix ) 03/12/2023, 06/06/2023    Health Maintenance  Topic Date Due   HIV Screening  Never done   Hepatitis B Vaccines 19-59 Average Risk (1 of 3 - 19+ 3-dose series) Never done   Diabetic kidney evaluation - Urine ACR  03/11/2024   Influenza Vaccine  12/01/2024 (Originally 04/03/2024)   OPHTHALMOLOGY EXAM  08/28/2024   HEMOGLOBIN A1C  01/04/2025   Diabetic kidney evaluation - eGFR measurement  04/13/2025   FOOT EXAM  07/07/2025   Mammogram  07/29/2025   DTaP/Tdap/Td (2 - Td or Tdap) 05/30/2030   Colonoscopy  02/03/2031  Pneumococcal Vaccine: 50+ Years  Completed   COVID-19 Vaccine  Completed   Hepatitis C Screening  Completed   Zoster Vaccines- Shingrix   Completed   HPV VACCINES  Aged Out   Meningococcal B Vaccine  Aged Out    Patient Care Team: Joyce Norleen BROCKS, MD as PCP - General (Family Medicine) Shlomo Wilbert SAUNDERS, MD as PCP - Cardiology (Cardiology) Skeet Juliene SAUNDERS, DO as Consulting Physician (Neurology)   Outpatient Medications Prior to Visit  Medication Sig Note    albuterol  (VENTOLIN  HFA) 108 (90 Base) MCG/ACT inhaler Inhale 2 puffs into the lungs every 6 (six) hours as needed for wheezing or shortness of breath.    aluminum -magnesium  hydroxide-simethicone  (MAALOX) 200-200-20 MG/5ML SUSP Take 30 mLs by mouth 4 (four) times daily -  before meals and at bedtime. (Patient taking differently: Take 30 mLs by mouth as needed.)    aspirin  EC 81 MG tablet Take 1 tablet (81 mg total) by mouth daily. Swallow whole.    cetirizine  (ZYRTEC  ALLERGY) 10 MG tablet Take 1 tablet (10 mg total) by mouth daily. (Patient taking differently: Take 10 mg by mouth as needed for allergies.)    clobetasol (TEMOVATE) 0.05 % external solution Apply topically.    Colloidal Oatmeal (AVEENO ECZEMA THERAPY EX) Apply 1 application topically 2 (two) times daily.    diphenhydrAMINE  (BENADRYL ) 25 MG tablet Take 25 mg by mouth daily as needed for allergies.    folic acid (FOLVITE) 1 MG tablet Take 1 mg by mouth daily.    glucose blood test strip Use as instructed    Hyoscyamine Sulfate SL 0.125 MG SUBL Place 125 mcg under the tongue every 4 (four) hours as needed (bladder spasms).    lidocaine  (XYLOCAINE ) 2 % solution Use as directed 15 mLs in the mouth or throat as needed.    loratadine  (CLARITIN ) 10 MG tablet Take 10 mg by mouth daily as needed for allergies.    meloxicam  (MOBIC ) 15 MG tablet Take 1 tablet (15 mg total) by mouth daily. (Patient taking differently: Take 15 mg by mouth as needed for pain.) 07/07/2024: Last dose 3 months ago   nitroGLYCERIN  (NITROSTAT ) 0.4 MG SL tablet Place 0.4 mg under the tongue as needed for chest pain.    ondansetron  (ZOFRAN ) 4 MG tablet Take 1 tablet (4 mg total) by mouth every 8 (eight) hours as needed for nausea or vomiting.    pantoprazole  (PROTONIX ) 40 MG tablet Take 1 tablet (40 mg total) by mouth daily.    promethazine  (PHENERGAN ) 12.5 MG tablet Take 1-2 tablets (12.5-25 mg total) by mouth every 6 (six) hours as needed for nausea or vomiting.     pseudoephedrine  (SUDAFED) 30 MG tablet Take 1 tablet (30 mg total) by mouth every 8 (eight) hours as needed for congestion.    selenium sulfide (SELSUN BLUE) 1 % LOTN     tiZANidine  (ZANAFLEX ) 4 MG tablet Take 1 tablet (4 mg total) by mouth at bedtime as needed.    triamcinolone cream (KENALOG) 0.1 % SMARTSIG:1 Application Topical 2-3 Times Daily    [DISCONTINUED] esomeprazole  (NEXIUM ) 20 MG capsule Take 1 capsule (20 mg total) by mouth daily before breakfast.    metoprolol  tartrate (LOPRESSOR ) 100 MG tablet Take one tablet 2 hours before cardiac CT for heart greater than 55 (Patient not taking: Reported on 07/07/2024)    venlafaxine (EFFEXOR) 37.5 MG tablet Take 37.5 mg by mouth at bedtime.  (Patient not taking: Reported on 07/07/2024)    [DISCONTINUED] dapagliflozin  propanediol (FARXIGA )  10 MG TABS tablet Take 1 tablet (10 mg total) by mouth daily before breakfast.    [DISCONTINUED] ezetimibe  (ZETIA ) 10 MG tablet Take 1 tablet (10 mg total) by mouth daily.    [DISCONTINUED] lisinopril  (ZESTRIL ) 5 MG tablet Take 1 tablet (5 mg total) by mouth daily.    [DISCONTINUED] Rimegepant Sulfate (NURTEC) 75 MG TBDP TAKE 1 TABLET (75 MG TOTAL) BY MOUTH DAILY AS NEEDED    [DISCONTINUED] rosuvastatin  (CRESTOR ) 40 MG tablet Take 1 tablet (40 mg total) by mouth daily.    [DISCONTINUED] tirzepatide  (MOUNJARO ) 12.5 MG/0.5ML Pen Inject 12.5 mg into the skin once a week.    No facility-administered medications prior to visit.    ROS  Family and social history as well as health maintenance and immunizations was reviewed.     Objective:    BP 126/78   Pulse 72   Ht 5' 6 (1.676 m)   Wt 199 lb 3.2 oz (90.4 kg)   SpO2 98%   BMI 32.15 kg/m    Physical Exam  Alert and in no distress. Tympanic membranes and canals are normal. Pharyngeal area is normal. Neck is supple without adenopathy or thyromegaly. Cardiac exam shows a regular sinus rhythm without murmurs or gallops. Lungs are clear to auscultation.  Normal  foot exam Hemoglobin A1c is 5.8  Female with too much family physician I will come find you     Assessment & Plan:    Discussed health benefits of physical activity, and encouraged her to engage in regular exercise appropriate for her age and condition.  Routine general medical examination at a health care facility  Arthritis  Status post total left knee replacement  S/P TKR (total knee replacement), right  Adverse effect of metformin , sequela  Gastroesophageal reflux disease without esophagitis  treat as needed  Family history of coronary arteriosclerosis  Need for influenza vaccination  Need for COVID-19 vaccine - Plan: Pfizer Comirnaty Covid-19 Vaccine 37yrs & older         Adult Wellness Visit Routine wellness visit with emphasis on lifestyle modifications. Weight loss progress noted. - Continue current diet and exercise regimen. - Encouraged continued stress management techniques.  Type 2 diabetes mellitus Managed with Mounjaro  and Farxiga . Blood glucose levels well-controlled. No metformin  due to intolerance. - Continue Mounjaro  and Farxiga . - Ordered A1c test. - Encouraged regular blood glucose monitoring.  Obesity Addressed with lifestyle modifications and medication. Significant weight loss achieved, focus on clothing size reduction. - Continue current weight loss efforts. - Encouraged continued lifestyle modifications.  Hyperlipidemia Managed with rosuvastatin  and Zetia . Cardiologist plans to add Repatha. Cardiovascular results are clear. - Continue rosuvastatin  and Zetia . - Will add Repatha as per cardiologist's recommendation.  Hypertension Managed with lisinopril . - Continue lisinopril .  Migraine Frequency decreased to once every three months. Nurtec used as needed. - Continue Nurtec as needed.  Allergic rhinitis due to pollen - Continue current allergy management.  Exercise-induced asthma Managed with Ventolin  as needed. - Continue Ventolin   as needed.  Sleep apnea, on CPAP therapy Managed with CPAP therapy. Improvement in fatigue noted. Follow-up required for insurance purposes. - Continue CPAP therapy. - Will schedule follow-up in 30 days for CPAP readout.     Follow-up 6 months   Norleen Jobs, MD

## 2024-07-08 ENCOUNTER — Ambulatory Visit: Payer: Self-pay | Admitting: Family Medicine

## 2024-07-09 ENCOUNTER — Encounter: Payer: 59 | Admitting: Family Medicine

## 2024-07-13 ENCOUNTER — Ambulatory Visit: Admitting: Pharmacist

## 2024-07-13 ENCOUNTER — Telehealth: Payer: Self-pay | Admitting: Pharmacist

## 2024-07-13 ENCOUNTER — Other Ambulatory Visit (HOSPITAL_COMMUNITY): Payer: Self-pay

## 2024-07-13 DIAGNOSIS — E1169 Type 2 diabetes mellitus with other specified complication: Secondary | ICD-10-CM | POA: Diagnosis not present

## 2024-07-13 DIAGNOSIS — E785 Hyperlipidemia, unspecified: Secondary | ICD-10-CM | POA: Diagnosis not present

## 2024-07-13 NOTE — Telephone Encounter (Signed)
 Submitted PA for Repatha and Vascepa. Status is pending.

## 2024-07-13 NOTE — Progress Notes (Signed)
 Karen Hicks

## 2024-07-13 NOTE — Assessment & Plan Note (Addendum)
 Assessment:  LDL goal: < 55 mg/dl. Last LDLc 190 mg/dl (88/5/74) TG uncontrolled at 253 mg/dL despite good diet. Likely needs pharmacologic intervention.  Tolerates Zetia  and moderate/high intensity statins well without any side effects, has taken consistently for past two months.  Will check Lp(a) to gather more information regarding cardiovascular risk.  Discussed next potential options (PCSK-9 inhibitors, bempedoic acid, and inclisiran); cost, dosing efficacy, side effects.    Plan: Continue taking current medications (rosuvastatin , ezetimibe ). Encouraged adherence since GI symptoms due to the previously discontinued metformin .   Start taking fish oil (Vascepa) after insurance approval.   Will assess insurance coverage for PCSK9i and Vascepa; will inform patient upon approval (prefers MyChart message) Lp(a) due today.  Lipid lab due in 2-3 months after therapy change.

## 2024-07-13 NOTE — Telephone Encounter (Signed)
 On statin and Zetia  - will assess coverage for PCSK9i

## 2024-07-13 NOTE — Patient Instructions (Addendum)
 Your Results:             Your most recent labs Goal  Total Cholesterol 276 < 200  Triglycerides 253 < 150  HDL (happy/good cholesterol) 37 > 40  LDL (lousy/bad cholesterol 190 < 55   Medication changes: We will start the process to get Repatha covered by your insurance.  Once the prior authorization is complete, we will call you to let you know and confirm pharmacy information. You will continue take rosuvastatin  and ezetimibe  as prescribed. Also start taking fish oil 2 capsules twice daily to lower triglycerides. You can start by taking 1 capsule twice daily for 1 week to allow your body to adjust.     Repatha is a cholesterol medication that improved your body's ability to get rid of bad cholesterol known as LDL. It can lower your LDL up to 60%! It is an injection that is given under the skin every 2 weeks. The medication often requires a prior authorization from your insurance company. We will take care of submitting all the necessary information to your insurance company to get it approved. The most common side effects of Repatha include runny nose, symptoms of the common cold, rarely flu or flu-like symptoms, back/muscle pain in about 3-4% of the patients, and redness, pain, or bruising at the injection site.   Lab orders: We will check Lp(a) today. This is a different type of 'bad cholesterol' that is largely hereditary.  We want to repeat labs after 2-3 months.  We will send you a lab order to remind you once we get closer to that time.

## 2024-07-13 NOTE — Progress Notes (Signed)
 Patient ID: Karen Hicks                 DOB: July 10, 1971                    MRN: [id number]      HPI: Karen Hicks is a 53 y.o. female patient referred to lipid clinic by [facility]. PMH is significant for hypertension, DM, Sleep apnea, HLD, GERD, family hx of CAD. 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   Pt has on Crestor  40 mg daily and Zetia  10 mg daily - fill hx indicates non-adherence. Reports taking cholesterol medications 'off and on' due to GI symptoms of unknown cause. MD determined GI intolerance due to metformin , now discontinued. Has been taking rosuvastatin  and ezetimibe  for past 2 months without issue. 3 months back TG 219 LDLc 160 and recent lab TG 253 and LDLc 190   Reports recent weight loss and A1c drop to 5.8% with Mounjaro . Ate seafood frequently in childhood and now prefers to avoid it. Denies and alcohol or tobacco use.   Reviewed options for lowering LDL cholesterol, including PCSK-9 inhibitors, bempedoic acid, and inclisiran. Discussed mechanisms of action, dosing, side effects and potential decreases in LDL cholesterol.  Also reviewed cost information and potential options for patient assistance.  Current Medications: rosuvastatin  40 mg daily, Zetia  10 mg daily  Intolerances: n/a Risk Factors: possible FH, family hx of CAD, T2DM, HTN  LDL goal: <55 mg/dl  Recent lipid lab 88/95: TC 276, TG 253, HDL 37, LDLc 190  Diet: Low sodium diet. Has spoken to nutritionist about portion size. Frequently eats fruit and vegetables, likes occasional smoothies with  fruits and greek yogurt. Eats steak about once a week.   Exercise: Averages 7,500 steps per day with goal of 10,000. Plans to join gym with pool to swim more. Uses 2 - 4 lb weights for resistance exercise three times per week.   Family History:  Mother (Alive) Arthritis   Diabetes   Gallbladder disease   Glaucoma   Heart disease   Hypertension   Kidney disease   Migraines    Seizures   Stroke     Father Metallurgist) Arthritis   Diabetes   Gallbladder disease   Hypertension   Kidney disease     Sister Metallurgist) Migraines     Sister (Alive)   Sister (Alive)   Brother (Alive) Migraines     Maternal grandmother had PMH pacemaker placement, stroke, passed at age 61. Mother PMH cHF. Paternal grandparents died from MI (passed at ages 4 and 52).   Social History: Stopped alcohol intake due to fatty liver disease. No history of smoking.   Labs:  Lipid Panel     Component Value Date/Time   CHOL 276 (H) 07/07/2024 0959   TRIG 253 (H) 07/07/2024 0959   HDL 37 (L) 07/07/2024 0959   CHOLHDL 7.5 (H) 07/07/2024 0959   CHOLHDL 6.9 06/10/2013 1413   VLDL 47 (H) 06/10/2013 1413   LDLCALC 190 (H) 07/07/2024 0959   LABVLDL 49 (H) 07/07/2024 0959    Past Medical History:  Diagnosis Date   Allergy    Anemia    when she was young   Arthritis    Complication of anesthesia    takes her alittle longer to wake up   GERD (gastroesophageal reflux disease)    Irritable bowel    Lactose intolerance    Migraine    PE (pulmonary embolism)    in  1994 meniscus repair.  Saw Dr. LaLonde   Pneumonia    PONV (postoperative nausea and vomiting)    Pre-diabetes    Restless legs syndrome    Sleep apnea    2007.  Uses the mask & machine when she needs it.  Dr. Joyce is aware    Current Outpatient Medications on File Prior to Visit  Medication Sig Dispense Refill   albuterol  (VENTOLIN  HFA) 108 (90 Base) MCG/ACT inhaler Inhale 2 puffs into the lungs every 6 (six) hours as needed for wheezing or shortness of breath. 8 g 0   aluminum -magnesium  hydroxide-simethicone  (MAALOX) 200-200-20 MG/5ML SUSP Take 30 mLs by mouth 4 (four) times daily -  before meals and at bedtime. (Patient taking differently: Take 30 mLs by mouth as needed.) 1680 mL 0   aspirin  EC 81 MG tablet Take 1 tablet (81 mg total) by mouth daily. Swallow whole.     cetirizine  (ZYRTEC  ALLERGY) 10 MG tablet Take 1  tablet (10 mg total) by mouth daily. (Patient taking differently: Take 10 mg by mouth as needed for allergies.) 30 tablet 0   clobetasol (TEMOVATE) 0.05 % external solution Apply topically.     Colloidal Oatmeal (AVEENO ECZEMA THERAPY EX) Apply 1 application topically 2 (two) times daily.     dapagliflozin  propanediol (FARXIGA ) 10 MG TABS tablet Take 1 tablet (10 mg total) by mouth daily before breakfast. 90 tablet 1   diphenhydrAMINE  (BENADRYL ) 25 MG tablet Take 25 mg by mouth daily as needed for allergies.     ezetimibe  (ZETIA ) 10 MG tablet Take 1 tablet (10 mg total) by mouth daily. 90 tablet 3   folic acid (FOLVITE) 1 MG tablet Take 1 mg by mouth daily.     glucose blood test strip Use as instructed 100 each 12   Hyoscyamine Sulfate SL 0.125 MG SUBL Place 125 mcg under the tongue every 4 (four) hours as needed (bladder spasms).     lidocaine  (XYLOCAINE ) 2 % solution Use as directed 15 mLs in the mouth or throat as needed. 100 mL 0   lisinopril  (ZESTRIL ) 5 MG tablet Take 1 tablet (5 mg total) by mouth daily. 90 tablet 3   loratadine  (CLARITIN ) 10 MG tablet Take 10 mg by mouth daily as needed for allergies.     meloxicam  (MOBIC ) 15 MG tablet Take 1 tablet (15 mg total) by mouth daily. (Patient taking differently: Take 15 mg by mouth as needed for pain.) 30 tablet 0   metoprolol  tartrate (LOPRESSOR ) 100 MG tablet Take one tablet 2 hours before cardiac CT for heart greater than 55 (Patient not taking: Reported on 07/07/2024) 1 tablet 0   nitroGLYCERIN  (NITROSTAT ) 0.4 MG SL tablet Place 0.4 mg under the tongue as needed for chest pain.     ondansetron  (ZOFRAN ) 4 MG tablet Take 1 tablet (4 mg total) by mouth every 8 (eight) hours as needed for nausea or vomiting. 20 tablet 0   pantoprazole  (PROTONIX ) 40 MG tablet Take 1 tablet (40 mg total) by mouth daily. 30 tablet 1   promethazine  (PHENERGAN ) 12.5 MG tablet Take 1-2 tablets (12.5-25 mg total) by mouth every 6 (six) hours as needed for nausea or  vomiting. 30 tablet 0   pseudoephedrine  (SUDAFED) 30 MG tablet Take 1 tablet (30 mg total) by mouth every 8 (eight) hours as needed for congestion. 30 tablet 0   Rimegepant Sulfate (NURTEC) 75 MG TBDP Take 1 tablet (75 mg total) by mouth daily as needed. 10 tablet 1  rosuvastatin  (CRESTOR ) 40 MG tablet Take 1 tablet (40 mg total) by mouth daily. 90 tablet 3   selenium sulfide (SELSUN BLUE) 1 % LOTN      tirzepatide  (MOUNJARO ) 12.5 MG/0.5ML Pen Inject 12.5 mg into the skin once a week. 6 mL 1   tiZANidine  (ZANAFLEX ) 4 MG tablet Take 1 tablet (4 mg total) by mouth at bedtime as needed. 30 tablet 0   triamcinolone cream (KENALOG) 0.1 % SMARTSIG:1 Application Topical 2-3 Times Daily     venlafaxine (EFFEXOR) 37.5 MG tablet Take 37.5 mg by mouth at bedtime.  (Patient not taking: Reported on 07/07/2024)     No current facility-administered medications on file prior to visit.    Allergies  Allergen Reactions   Mushroom Extract Complex (Obsolete) Anaphylaxis   Peach Flavoring Agent (Non-Screening) Anaphylaxis   Adhesive [Tape] Hives    Paper tape only   Penicillins Hives, Rash and Other (See Comments)    Tingling and shaking PATIENT HAS HAD A PCN REACTION WITH IMMEDIATE RASH, FACIAL/TONGUE/THROAT SWELLING, SOB, OR LIGHTHEADEDNESS WITH HYPOTENSION:  #  #  YES  #  #  HAS PT DEVELOPED SEVERE RASH INVOLVING MUCUS MEMBRANES or SKIN NECROSIS: #  #  YES  #  #  Has patient had a PCN reaction that required hospitalization No Has patient had a PCN reaction occurring within the last 10 years: #  #  #  YES  #  #  #     Latex Itching    Assessment/Plan:  1. Hyperlipidemia -  No problems updated.  Hyperlipidemia associated with type 2 diabetes mellitus (HCC) Assessment:  LDL goal: < 55 mg/dl. Last LDLc 190 mg/dl (88/5/74) TG uncontrolled at 253 mg/dL despite good diet. Likely needs pharmacologic intervention.  Tolerates Zetia  and moderate/high intensity statins well without any side effects, has  taken consistently for past two months.  Will check Lp(a) to gather more information regarding cardiovascular risk.  Discussed next potential options (PCSK-9 inhibitors, bempedoic acid, and inclisiran); cost, dosing efficacy, side effects.    Plan: Continue taking current medications (rosuvastatin , ezetimibe ). Encouraged adherence since GI symptoms due to the previously discontinued metformin .   Start taking fish oil (Vascepa) after insurance approval.   Will assess insurance coverage for PCSK9i and Vascepa; will inform patient upon approval (prefers MyChart message) Lp(a) due today.  Lipid lab due in 2-3 months after therapy change.   Thank you,  Noah Jordan, Pharm.D Candidate  Robbi Blanch, [address].D [place] BIR[place]. [facility] [address], [place], [place] [address] Phone: [phone]; Fax: [phone]

## 2024-07-14 ENCOUNTER — Other Ambulatory Visit (HOSPITAL_COMMUNITY): Payer: Self-pay

## 2024-07-14 ENCOUNTER — Ambulatory Visit: Payer: Self-pay | Admitting: Pharmacist

## 2024-07-14 LAB — LIPOPROTEIN A (LPA): Lipoprotein (a): 37.9 nmol/L (ref <75.0)

## 2024-07-15 ENCOUNTER — Other Ambulatory Visit (HOSPITAL_COMMUNITY): Payer: Self-pay

## 2024-07-15 NOTE — Telephone Encounter (Signed)
 Pharmacy Patient Advocate Encounter  Received notification from [facility] that Prior Authorization for REPATHA AND VASCEPA has been APPROVED from 07/13/24 to 07/13/25. Ran test claim, Copay is $30-REPATHA; $5-VASCEPA. This test claim was processed through [facility]- copay amounts may vary at other pharmacies due to pharmacy/plan contracts, or as the patient moves through the different stages of their insurance plan.

## 2024-07-17 MED ORDER — ICOSAPENT ETHYL 1 G PO CAPS: 2.0000 g | ORAL_CAPSULE | Freq: Two times a day (BID) | ORAL | 11 refills | Status: AC

## 2024-07-17 MED ORDER — REPATHA SURECLICK 140 MG/ML ~~LOC~~ SOAJ: 140.0000 mg | SUBCUTANEOUS | 3 refills | Status: AC

## 2024-07-17 NOTE — Addendum Note (Signed)
 Addended by: Senaya Dicenso K on: 07/17/2024 02:37 PM   Modules accepted: Orders

## 2024-08-17 ENCOUNTER — Ambulatory Visit
Admission: EM | Admit: 2024-08-17 | Discharge: 2024-08-17 | Disposition: A | Discharge status: Home / Self Care | Source: Ambulatory Visit | Attending: Emergency Medicine | Admitting: Emergency Medicine

## 2024-08-17 ENCOUNTER — Encounter: Payer: Self-pay | Admitting: Emergency Medicine

## 2024-08-17 DIAGNOSIS — B349 Viral infection, unspecified: Secondary | ICD-10-CM

## 2024-08-17 LAB — POC COVID19/FLU A&B COMBO
Covid Antigen, POC: NEGATIVE
Influenza A Antigen, POC: NEGATIVE
Influenza B Antigen, POC: NEGATIVE

## 2024-08-17 MED ORDER — BENZONATATE 100 MG PO CAPS: 100.0000 mg | ORAL_CAPSULE | Freq: Three times a day (TID) | ORAL | 0 refills | Status: AC

## 2024-08-17 MED ORDER — PREDNISONE 10 MG (21) PO TBPK: ORAL_TABLET | Freq: Every day | ORAL | 0 refills | Status: DC

## 2024-08-17 MED ORDER — GUAIFENESIN-CODEINE 100-10 MG/5ML PO SOLN: 5.0000 mL | Freq: Four times a day (QID) | ORAL | 0 refills | Status: DC | PRN

## 2024-08-17 NOTE — Discharge Instructions (Addendum)
 Your symptoms today are most likely being caused by a virus and should steadily improve in time it can take up to 7 to 10 days before you truly start to see a turnaround however things will get better  COVID and flu testing negative  Begin prednisone  every morning with food to help in a relaxed airway making it easier for you to breathe, avoid ibuprofen while taking may use Tylenol   You may use Tessalon  pill every 8 hours as needed for cough you may use cough syrup every 6 hours but please be mindful of the cough syrup can make you feel drowsy    You can take Tylenol  and/or Ibuprofen as needed for fever reduction and pain relief.   For cough: honey 1/2 to 1 teaspoon (you can dilute the honey in water or another fluid).  You can also use guaifenesin  and dextromethorphan for cough. You can use a humidifier for chest congestion and cough.  If you don't have a humidifier, you can sit in the bathroom with the hot shower running.      For sore throat: try warm salt water gargles, cepacol lozenges, throat spray, warm tea or water with lemon/honey, popsicles or ice, or OTC cold relief medicine for throat discomfort.   For congestion: take a daily anti-histamine like Zyrtec , Claritin , and a oral decongestant, such as pseudoephedrine .  You can also use Flonase 1-2 sprays in each nostril daily.   It is important to stay hydrated: drink plenty of fluids (water, gatorade/powerade/pedialyte, juices, or teas) to keep your throat moisturized and help further relieve irritation/discomfort.

## 2024-08-17 NOTE — ED Provider Notes (Signed)
 Karen Hicks    CSN: [id number] Arrival date & time: 08/17/24  0818      History   Chief Complaint Chief Complaint  Patient presents with   Cough   Sore Throat   Fatigue   Nasal Congestion   Generalized Body Aches    HPI Karen Hicks is a 53 y.o. female.   Patient is for evaluation of nasal congestion, sinus pain and pressure to the bridge of the nose and behind the eyes, sore throat, productive cough with discolored sputum and intermittent wheezing present for 3 days.  Initially wheezing occurring independently but now occurring only with cough.  Tolerable to food and liquids but appetite is decreased.  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  Has attempted use of albuterol  inhaler, history of asthma, denies shortness of breath.  Additionally attempted Coricidin.    Past Medical History:  Diagnosis Date   Allergy    Anemia    when she was young   Arthritis    Complication of anesthesia    takes her alittle longer to wake up   GERD (gastroesophageal reflux disease)    Irritable bowel    Lactose intolerance    Migraine    PE (pulmonary embolism)    in 1994 meniscus repair.  Saw Dr. LaLonde   Pneumonia    PONV (postoperative nausea and vomiting)    Pre-diabetes    Restless legs syndrome    Sleep apnea    2007.  Uses the mask & machine when she needs it.  Dr. Joyce is aware    Patient Active Problem List   Diagnosis Date Noted   Precordial chest pain 04/09/2024   Family history of coronary arteriosclerosis 04/09/2024   Hypertension associated with diabetes (HCC) 04/09/2024   Metformin  adverse reaction 03/12/2023   Hot flashes 04/20/2021   Exercise-induced asthma 04/20/2021   Hyperlipidemia associated with type 2 diabetes mellitus (HCC) 10/06/2019   Type 2 diabetes mellitus without complications (HCC) 02/21/2018   Chest pain 10/25/2017   S/P TKR (total knee replacement), right 07/31/2016   Status post total left knee  replacement 12/14/2014   Arthritis 07/21/2014   GERD (gastroesophageal reflux disease) 06/12/2011   Sleep apnea 06/12/2011   Obesity (BMI 30-39.9) 06/12/2011   Allergic rhinitis due to pollen 06/12/2011   Migraine headache 06/12/2011    Past Surgical History:  Procedure Laterality Date   CESAREAN SECTION     HARDWARE REMOVAL Left 08/03/2014   Procedure: HARDWARE REMOVAL;  Surgeon: Maude KANDICE Herald, MD;  Location: [facility] OR;  Service: Orthopedics;  Laterality: Left;   HARDWARE REMOVAL Right 07/31/2016   Procedure: HARDWARE REMOVAL;  Surgeon: Maude Herald, MD;  Location[facility];  Service: Orthopedics;  Laterality: Right;   KNEE SURGERY Bilateral    multiple   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  07/19/2014   right knee moderate DJD with multiple surgeries  07/19/2014   right wrist arthroscopy  2009   TOTAL ABDOMINAL HYSTERECTOMY  01/2006   FIBROIDS   TOTAL KNEE ARTHROPLASTY Left 08/03/2014   dr herald   TOTAL KNEE ARTHROPLASTY Left 08/03/2014   Procedure: LEFT TOTAL KNEE ARTHROPLASTY;  Surgeon: Maude KANDICE Herald, MD;  Location: [facility] OR;  Service: Orthopedics;  Laterality: Left;   TOTAL KNEE ARTHROPLASTY Right 07/31/2016   Procedure: TOTAL KNEE ARTHROPLASTY;  Surgeon: Maude Herald, MD;  Location: [facility] OR;  Service: Orthopedics;  Laterality: Right;   UPPER GI ENDOSCOPY  02/18/2020    OB History  No obstetric history on file.      Home Medications    Prior to Admission medications  Medication Sig Start Date End Date Taking? Authorizing Provider  albuterol  (VENTOLIN  HFA) 108 (90 Base) MCG/ACT inhaler Inhale 2 puffs into the lungs every 6 (six) hours as needed for wheezing or shortness of breath. 03/10/24   Joyce Norleen BROCKS, MD  aluminum -magnesium  hydroxide-simethicone  (MAALOX) 200-200-20 MG/5ML SUSP Take 30 mLs by mouth 4 (four) times daily -  before meals and at bedtime. Patient taking differently: Take 30 mLs by mouth as needed. 04/27/24   Teresa Shelba SAUNDERS, NP  aspirin   EC 81 MG tablet Take 1 tablet (81 mg total) by mouth daily. Swallow whole. 04/09/24   Krasowski, Robert J, MD  cetirizine  (ZYRTEC  ALLERGY) 10 MG tablet Take 1 tablet (10 mg total) by mouth daily. Patient taking differently: Take 10 mg by mouth as needed for allergies. 05/19/23   Christopher Savannah, PA-C  clobetasol (TEMOVATE) 0.05 % external solution Apply topically. 10/20/20   [provider]  Colloidal Oatmeal (AVEENO ECZEMA THERAPY EX) Apply 1 application topically 2 (two) times daily.    [provider]  dapagliflozin  propanediol (FARXIGA ) 10 MG TABS tablet Take 1 tablet (10 mg total) by mouth daily before breakfast. 07/07/24   Joyce Norleen BROCKS, MD  diphenhydrAMINE  (BENADRYL ) 25 MG tablet Take 25 mg by mouth daily as needed for allergies.    [provider]  Evolocumab  (REPATHA  SURECLICK) 140 MG/ML SOAJ Inject 140 mg into the skin every 14 (fourteen) days. 07/17/24   Shlomo Wilbert SAUNDERS, MD  ezetimibe  (ZETIA ) 10 MG tablet Take 1 tablet (10 mg total) by mouth daily. 07/07/24   Lalonde, John C, MD  folic acid (FOLVITE) 1 MG tablet Take 1 mg by mouth daily. 11/21/22   [provider]  glucose blood test strip Use as instructed 01/21/18   Lalonde, John C, MD  Hyoscyamine Sulfate SL 0.125 MG SUBL Place 125 mcg under the tongue every 4 (four) hours as needed (bladder spasms). 12/01/22   [provider]  icosapent  Ethyl (VASCEPA ) 1 g capsule Take 2 capsules (2 g total) by mouth 2 (two) times daily. 07/17/24   Shlomo Wilbert SAUNDERS, MD  lidocaine  (XYLOCAINE ) 2 % solution Use as directed 15 mLs in the mouth or throat as needed. 04/27/24   Johaan Ryser, Shelba SAUNDERS, NP  lisinopril  (ZESTRIL ) 5 MG tablet Take 1 tablet (5 mg total) by mouth daily. 07/07/24   Joyce Norleen BROCKS, MD  loratadine  (CLARITIN ) 10 MG tablet Take 10 mg by mouth daily as needed for allergies.    [provider]  meloxicam  (MOBIC ) 15 MG tablet Take 1 tablet (15 mg total) by mouth daily. Patient taking differently: Take 15  mg by mouth as needed for pain. 03/17/24   Leonce Katz, DO  metoprolol  tartrate (LOPRESSOR ) 100 MG tablet Take one tablet 2 hours before cardiac CT for heart greater than 55 Patient not taking: Reported on 07/07/2024 04/09/24   Krasowski, Robert J, MD  nitroGLYCERIN  (NITROSTAT ) 0.4 MG SL tablet Place 0.4 mg under the tongue as needed for chest pain.    [provider]  ondansetron  (ZOFRAN ) 4 MG tablet Take 1 tablet (4 mg total) by mouth every 8 (eight) hours as needed for nausea or vomiting. 11/23/21   Trudy Hendricks NOVAK, PA-C  pantoprazole  (PROTONIX ) 40 MG tablet Take 1 tablet (40 mg total) by mouth daily. 04/27/24 07/07/24  Teresa Shelba SAUNDERS, NP  promethazine  (PHENERGAN ) 12.5 MG tablet Take 1-2  tablets (12.5-25 mg total) by mouth every 6 (six) hours as needed for nausea or vomiting. 08/19/18   Lenis Barter, PA-C  pseudoephedrine  (SUDAFED) 30 MG tablet Take 1 tablet (30 mg total) by mouth every 8 (eight) hours as needed for congestion. 05/19/23   Christopher Savannah, PA-C  Rimegepant Sulfate (NURTEC) 75 MG TBDP Take 1 tablet (75 mg total) by mouth daily as needed. 07/07/24   Joyce Norleen BROCKS, MD  rosuvastatin  (CRESTOR ) 40 MG tablet Take 1 tablet (40 mg total) by mouth daily. 07/07/24   Lalonde, John C, MD  selenium sulfide (SELSUN BLUE) 1 % LOTN  10/20/20   [provider]  tirzepatide  (MOUNJARO ) 12.5 MG/0.5ML Pen Inject 12.5 mg into the skin once a week. 07/07/24   Joyce Norleen BROCKS, MD  tiZANidine  (ZANAFLEX ) 4 MG tablet Take 1 tablet (4 mg total) by mouth at bedtime as needed. 03/17/24   Leonce Katz, DO  triamcinolone cream (KENALOG) 0.1 % SMARTSIG:1 Application Topical 2-3 Times Daily 05/12/21   [provider]  venlafaxine (EFFEXOR) 37.5 MG tablet Take 37.5 mg by mouth at bedtime.  Patient not taking: Reported on 07/07/2024 12/16/19   [provider]    Family History Family History  Problem Relation Age of Onset   Arthritis Mother    Diabetes Mother    Heart disease  Mother    Hypertension Mother    Kidney disease Mother    Stroke Mother    Gallbladder disease Mother    Glaucoma Mother    Seizures Mother    Migraines Mother    Arthritis Father    Diabetes Father    Hypertension Father    Kidney disease Father    Gallbladder disease Father    Migraines Sister    Migraines Brother    Healthy Son     Social History Social History[1]   Allergies   Mushroom extract complex (obsolete), Peach flavoring agent (non-screening), Adhesive [tape], Penicillins, and Latex   Review of Systems Review of Systems  Constitutional: Negative.   HENT:  Positive for congestion, sinus pressure, sinus pain and sore throat. Negative for dental problem, drooling, ear discharge, ear pain, facial swelling, hearing loss, mouth sores, nosebleeds, postnasal drip, rhinorrhea, sneezing, tinnitus, trouble swallowing and voice change.   Respiratory:  Positive for cough and wheezing. Negative for apnea, choking, chest tightness, shortness of breath and stridor.      Physical Exam Triage Vital Signs ED Triage Vitals  Encounter Vitals Group     BP 08/17/24 0833 138/83     Girls Systolic BP Percentile --      Girls Diastolic BP Percentile --      Boys Systolic BP Percentile --      Boys Diastolic BP Percentile --      Pulse Rate 08/17/24 0833 74     Resp 08/17/24 0833 18     Temp 08/17/24 0833 98.1 F (36.7 C)     Temp Source 08/17/24 0833 Oral     SpO2 08/17/24 0833 98 %     Weight --      Height --      Head Circumference --      Peak Flow --      Pain Score 08/17/24 0836 7     Pain Loc --      Pain Education --      Exclude from Growth Chart --    No data found.  Updated Vital Signs BP 138/83 (BP Location: Left Arm)  Pulse 74   Temp 98.1 F (36.7 C) (Oral)   Resp 18   SpO2 98%   Visual Acuity Right Eye Distance:   Left Eye Distance:   Bilateral Distance:    Right Eye Near:   Left Eye Near:    Bilateral Near:     Physical  Exam Constitutional:      Appearance: Normal appearance.  HENT:     Right Ear: Tympanic membrane, ear canal and external ear normal.     Left Ear: Tympanic membrane, ear canal and external ear normal.     Nose: Congestion present.     Mouth/Throat:     Pharynx: No oropharyngeal exudate or posterior oropharyngeal erythema.  Eyes:     Extraocular Movements: Extraocular movements intact.  Cardiovascular:     Rate and Rhythm: Normal rate and regular rhythm.     Pulses: Normal pulses.     Heart sounds: Normal heart sounds.  Pulmonary:     Effort: Pulmonary effort is normal.     Breath sounds: Normal breath sounds.  Lymphadenopathy:     Cervical: Cervical adenopathy present.  Neurological:     Mental Status: She is alert and oriented to person, place, and time. Mental status is at baseline.      UC Treatments / Results  Labs (all labs ordered are listed, but only abnormal results are displayed) Labs Reviewed  POC COVID19/FLU A&B COMBO    EKG   Radiology No results found.  Procedures Procedures (including critical care time)  Medications Ordered in UC Medications - No data to display  Initial Impression / Assessment and Plan / UC Course  I have reviewed the triage vital signs and the nursing notes.  Pertinent labs & imaging results that were available during my care of the patient were reviewed by me and considered in my medical decision making (see chart for details).  Viral illness  Patient is in no signs of distress nor toxic appearing.  Vital signs are stable.  Low suspicion for pneumonia, pneumothorax or bronchitis and therefore will defer imaging.  COVID and flu test negative.  Discussed findings with patient, prescribed prednisone  Tessalon  and guaifenesin  codeine , PDMP reviewed, low risk. May use additional over-the-counter medications as needed for supportive care.  May follow-up with urgent care as needed if symptoms persist or worsen.  Note given.   Final  Clinical Impressions(s) / UC Diagnoses   Final diagnoses:  Viral illness   Discharge Instructions   None    ED Prescriptions   None    PDMP not reviewed this encounter.     [1]  Social History Tobacco Use   Smoking status: Never    Passive exposure: Never   Smokeless tobacco: Never  Vaping Use   Vaping status: Never Used  Substance Use Topics   Alcohol use: Not Currently   Drug use: No     Teresa Shelba SAUNDERS, NP 08/17/24 [phone]

## 2024-08-17 NOTE — ED Triage Notes (Signed)
 Patient reports cough with yellowish-greenish mucus, nasal congestion and body aches, sore throat and fatigue x 3 days. Patient has ben OTC Cold and Flu medication with mild relief.  Rates sore throat pain 7/10 and body aches 5/10.

## 2024-09-08 ENCOUNTER — Ambulatory Visit: Admitting: Family Medicine

## 2024-09-08 VITALS — BP 118/70 | HR 89 | Wt 200.0 lb

## 2024-09-08 DIAGNOSIS — E669 Obesity, unspecified: Secondary | ICD-10-CM

## 2024-09-08 NOTE — Progress Notes (Signed)
" ° °  Subjective:    Patient ID: Karen Hicks, female    DOB: [birth year], 54 y.o.   MRN: [id number]  Discussed the use of AI scribe software for clinical note transcription with the patient, who gave verbal consent to proceed.  History of Present Illness   Tynslee Bowlds is a 54 year old female with obstructive sleep apnea who presents for a follow-up regarding CPAP therapy.  She has been using a CPAP machine for approximately two to two and a half months, experiencing significant improvements in her symptoms, including better rest, less fatigue, fewer headache episodes, and no longer waking up with a dry mouth. She reports that the heated humidifier feature of the CPAP machine has been helpful. She monitors her CPAP usage and compliance, achieving over four hours of use per night for at least 21 days per month, confirmed with the machine's feedback and her phone app.  She mentions a recent blood pressure reading of 118/70 mmHg, which she finds exciting as her previous readings were higher, often in the 180s, 130s, and 120s. She is currently taking Zetia  among other medications.  She discusses a cyst in her thumb, identified through an MRI, causing significant discomfort and located in the capsule of her thumb. She has been referred to a finger specialist for further evaluation. Additionally, she has a history of carpal tunnel surgery on her other hand.  Her last A1c was 5.6, and she is up to date on her vaccinations, having received a flu shot on October 9th.  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 She frequently visits [place], where her family resides, and is accustomed to traveling 34 minutes for her medical appointments.           Review of Systems     Objective:    Physical Exam Alert and in no distress otherwise not examined          Assessment & Plan:  Assessment and Plan    Obstructive sleep  apnea Managed with CPAP therapy for two and a half months. Significant symptom improvement. CPAP usage meets compliance criteria. Blood pressure improved to 118/70 mmHg. - Continue CPAP therapy as currently prescribed.  General health maintenance Up to date on vaccinations, including flu shot on October 9th. - Documented flu vaccination received on October 9th.        "

## 2024-09-30 ENCOUNTER — Other Ambulatory Visit: Payer: Self-pay

## 2024-10-02 LAB — SURGICAL PATHOLOGY

## 2024-12-08 ENCOUNTER — Ambulatory Visit: Admitting: Family Medicine
